# Patient Record
Sex: Male | Born: 1965 | Hispanic: Yes | Marital: Married | Smoking: Never smoker
Health system: Southern US, Community
[De-identification: ages and names within clinical notes are randomized; demographics above are authoritative.]

## PROBLEM LIST (undated history)

## (undated) DIAGNOSIS — K259 Gastric ulcer, unspecified as acute or chronic, without hemorrhage or perforation: Secondary | ICD-10-CM

## (undated) DIAGNOSIS — E785 Hyperlipidemia, unspecified: Secondary | ICD-10-CM

## (undated) DIAGNOSIS — C801 Malignant (primary) neoplasm, unspecified: Secondary | ICD-10-CM

## (undated) DIAGNOSIS — G709 Myoneural disorder, unspecified: Secondary | ICD-10-CM

## (undated) DIAGNOSIS — E119 Type 2 diabetes mellitus without complications: Secondary | ICD-10-CM

## (undated) HISTORY — DX: Hyperlipidemia, unspecified: E78.5

## (undated) HISTORY — DX: Myoneural disorder, unspecified: G70.9

## (undated) HISTORY — DX: Gastric ulcer, unspecified as acute or chronic, without hemorrhage or perforation: K25.9

## (undated) HISTORY — DX: Type 2 diabetes mellitus without complications: E11.9

---

## 2009-02-13 ENCOUNTER — Observation Stay: Payer: Self-pay | Admitting: Internal Medicine

## 2011-08-09 ENCOUNTER — Ambulatory Visit: Payer: Self-pay | Admitting: Family Medicine

## 2015-05-13 DIAGNOSIS — L97411 Non-pressure chronic ulcer of right heel and midfoot limited to breakdown of skin: Secondary | ICD-10-CM | POA: Insufficient documentation

## 2015-05-13 DIAGNOSIS — L97519 Non-pressure chronic ulcer of other part of right foot with unspecified severity: Secondary | ICD-10-CM

## 2015-05-13 DIAGNOSIS — E11621 Type 2 diabetes mellitus with foot ulcer: Secondary | ICD-10-CM | POA: Insufficient documentation

## 2018-08-27 ENCOUNTER — Encounter (INDEPENDENT_AMBULATORY_CARE_PROVIDER_SITE_OTHER): Payer: Self-pay

## 2018-08-27 ENCOUNTER — Ambulatory Visit: Payer: Self-pay | Admitting: Pharmacy Technician

## 2018-08-27 DIAGNOSIS — Z79899 Other long term (current) drug therapy: Secondary | ICD-10-CM

## 2018-08-27 NOTE — Progress Notes (Signed)
Patient speaks Spanish.  Interpretation provided by [facility] Interpreter.  Completed [facility] application and contract.  Patient agreed to all terms of the [facility] contract.    Patient approved to receive medication assistance at [facility] as long as eligibility criteria continues to be met.    Provided patient with Civil engineer, contracting based on his particular needs.    Referred patient for MTM.  Referred patient to [facility].  Patient stated that he is taking Tramadol and Lyrica.  Currently prescribed by Dr. Urban Gibson [facility].  Patient wishes to remain with Dr. Ladoris Gene.  [place] [facility]

## 2018-09-19 ENCOUNTER — Telehealth: Payer: Self-pay | Admitting: Pharmacy Technician

## 2018-09-19 NOTE — Telephone Encounter (Signed)
Received notification that a PAP Application submitted to [place] with missing information and financial documentation.  Had provided patient with Lyrica Application to complete with Dr. Ladoris Gene at [facility].  Contacted Biagio Borg at [employer].  Almyra Free is to contact patient and start process for completing Lyrica PAP Application.  [place] [facility]

## 2018-10-02 ENCOUNTER — Encounter (INDEPENDENT_AMBULATORY_CARE_PROVIDER_SITE_OTHER): Payer: Self-pay

## 2018-10-02 ENCOUNTER — Encounter: Payer: Self-pay | Admitting: Pharmacist

## 2018-10-02 ENCOUNTER — Other Ambulatory Visit: Payer: Self-pay

## 2018-10-02 ENCOUNTER — Ambulatory Visit: Payer: Self-pay | Admitting: Pharmacist

## 2018-10-02 VITALS — BP 125/71 | Ht 64.0 in | Wt 170.0 lb

## 2018-10-02 DIAGNOSIS — E119 Type 2 diabetes mellitus without complications: Secondary | ICD-10-CM | POA: Insufficient documentation

## 2018-10-02 DIAGNOSIS — Z79899 Other long term (current) drug therapy: Secondary | ICD-10-CM

## 2018-10-02 NOTE — Progress Notes (Signed)
[facility] Visit Note  Patient: Samuel Pennington MRN: [id number] Date of Birth: 08/10/1966 PCP: Ladoris Gene AT [place] 52 y.o. male presents for an initial MTM visit today. Primary objective of today's visit is to reconcile medications and assess compliance/adherance. Spanish interpretation provided by [facility] [place] Interpreter.  BP 125/71 (BP Location: Right Arm, Patient Position: Sitting, Cuff Size: Normal)   Ht 5\' 4"  (1.626 m)   Wt 170 lb (77.1 kg)   BMI 29.18 kg/m   Patient Information   Past Medical History:  Diagnosis Date  . Diabetes mellitus without complication ([place])   . Hyperlipidemia   . Neuromuscular disorder ([place])      History reviewed. No pertinent surgical history.   Family History  Problem Relation Age of Onset  . Hypertension Mother     New Diagnoses (since last visit): N/A  Family Support: GOOD  Lifestyle Diet: Breakfast:cheese, toast, coffee Lunch:3 tortillas, diet soda Dinner: salad, chicken, protein Snack; watermelon, orange, Drinks: water     Social History   Substance and Sexual Activity  Alcohol Use Yes   Comment: occassionallh      Social History   Tobacco Use  Smoking Status Never Smoker  Smokeless Tobacco Never Used       Outpatient Encounter Medications as of 10/02/2018  Medication Sig  . atorvastatin (LIPITOR) 40 MG tablet Take 40 mg by mouth at bedtime.  . cyclobenzaprine (FLEXERIL) 5 MG tablet Take 5 mg by mouth 3 (three) times daily as needed. for muscle spams  . glipiZIDE (GLUCOTROL XL) 5 MG 24 hr tablet Take 5 mg by mouth daily.  . metFORMIN (GLUCOPHAGE) 1000 MG tablet Take 1,000 mg by mouth daily.  . naproxen (NAPROSYN) 500 MG tablet Take 500 mg by mouth 2 (two) times daily with a meal.  . pregabalin (LYRICA) 75 MG capsule Take 75 mg by mouth 2 (two) times daily.  . [DISCONTINUED] naproxen (NAPROSYN) 500 MG tablet Take 500 mg by mouth 2 (two)  times daily with a meal.  . traMADol (ULTRAM) 50 MG tablet Take 50 mg by mouth every 6 (six) hours as needed.   No facility-administered encounter medications on file as of 10/02/2018.     Assessment and Plan:  Compliance/Adherance: he knows the names, frequencies, and indications for each of his medications without prompting. He states that he rarely misses doses, less than once a month. When he does miss he usually takes the dose several hours later. His last fills for medications obtained here at [facility] wer on 08/27/18 and pt still had 2 tabs left of each med. He stated that he still had some on a previous fill before they were filled here. He would like to be able to pick up his naproxen and cyclobenzaprine here. We will have these transferred today. I explained we will fill the cyclobenzaprine today and profile the naproxen.  DM: pt states he checks his sugar 2-3 times a week since the strips are so expensive. He usually only checks it when he does not feel good. We talked about recognizing lows and he stated that he does have lows occassionally maybe once a month. He recognizes what is happening and eats a piece of candy or chocolate. We also discussed that using orange juice was the best option and that regular soda could be used as well in a pinch. He is on Metformin 1000mg  daily, Glipzide ER 5mg  daily. He is also on Atorvastatin. Will send request  to MD to also add ASA 81mg  daily.  Back pain/nerve pain: well controlled on Lyrica. He was previously on TID and has been decreased to BID. He states this sufficiently controls his pain. He states he only takes the cyclobenzaprine at night since it makes him very sleepy. He told me that the naproxen causes stomach issues so he only takes it if he needs it and only if he eats a full meal. He is currently out of Tramadol but indicated that he would ask the MD for refills. Pt is aware that we are unable to provide Tramadol and Lyrica at [facility].  I would like  pt to RTC in 6 mos to assess and f/u on compliance.  Netta Neat, PharmD, [facility]) [phone]

## 2019-02-16 ENCOUNTER — Telehealth: Payer: Self-pay | Admitting: Pharmacy Technician

## 2019-02-16 NOTE — Telephone Encounter (Signed)
Received 2020 proof of income.  Patient eligible to receive medication assistance at [facility] as long as eligibility requirements continue to be met.  Still need tax return from [employer].  [place] [facility]

## 2019-04-09 ENCOUNTER — Ambulatory Visit: Payer: Self-pay | Admitting: Pharmacist

## 2019-04-09 ENCOUNTER — Encounter (INDEPENDENT_AMBULATORY_CARE_PROVIDER_SITE_OTHER): Payer: Self-pay

## 2019-04-09 ENCOUNTER — Encounter: Payer: Self-pay | Admitting: Pharmacist

## 2019-04-09 ENCOUNTER — Other Ambulatory Visit: Payer: Self-pay

## 2019-04-09 DIAGNOSIS — Z79899 Other long term (current) drug therapy: Secondary | ICD-10-CM

## 2019-04-09 NOTE — Progress Notes (Signed)
  [facility] Visit Note  Patient: Seymore Brodowski MRN: [id number] Date of Birth: June 25, 1966 PCP: No primary care provider on file.   [name] 53 y.o. male - picked up his medication today and a quick 15 minute assessment of compliance/adhereance was made.   Pt states he rarely misses doses maybe only about 1-2 times/month. He usually remembers several hours later.  DM: pt is still checking sugars 2-3 times/week. He states they run about 90-120 in the morning and about 130-140 if he checks in the afternoon. I do not have access to his labs/A1C done at [facility]. He is on Metformin 1000mg  and Glipizide ER 5mg  both QAM. He states he is not currently having  Any lows but he does know how to handle if they occur. He is on a high-intensity statin, but should also be on Aspirin 81mg  daily. Will send recommendation to PCP  to add Aspirin 81mg  for primary prevention.  Back pain: controlled with pregabalin and cyclobenzaprine. Sent refill request to PCP for Cyclobenzaprine.   RTC f/u 6 mos.  Netta Neat, PharmD, [facility]) [phone]

## 2020-01-19 ENCOUNTER — Other Ambulatory Visit: Payer: Self-pay

## 2020-01-19 ENCOUNTER — Ambulatory Visit: Payer: Self-pay | Admitting: Pharmacy Technician

## 2020-01-19 DIAGNOSIS — Z79899 Other long term (current) drug therapy: Secondary | ICD-10-CM

## 2020-01-19 NOTE — Progress Notes (Signed)
Patient speaks Spanish.  Interpretation was provided by [employer].  Samuel Pennington, ID# [id number].   Completed [facility] application, DOH Attestation, [phone] and Patient Advocate Letter. Patient is to provided notarized letter of support from son.  Patient verbally acknowledged that he understood that [facility] will be unable to provide medication assistance going forward without proof of income.    Patient stated that he is having difficulty paying rent and utilities due to loss of employment of spouse.  Sending referral to Warrick Parisian about possible assistance with paying rent and utilities.  [name] [facility]

## 2020-02-26 ENCOUNTER — Telehealth: Payer: Self-pay | Admitting: Pharmacy Technician

## 2020-02-26 NOTE — Telephone Encounter (Signed)
Received updated proof of income.  Patient eligible to receive medication assistance at [facility] until time for re-certification in 9359, and as long as eligibility requirements continue to be met.  [place] [facility]

## 2020-07-06 ENCOUNTER — Other Ambulatory Visit: Payer: Self-pay | Admitting: Family Medicine

## 2020-09-09 ENCOUNTER — Other Ambulatory Visit: Payer: Self-pay | Admitting: Family Medicine

## 2020-12-10 DIAGNOSIS — C801 Malignant (primary) neoplasm, unspecified: Secondary | ICD-10-CM

## 2020-12-10 HISTORY — DX: Malignant (primary) neoplasm, unspecified: C80.1

## 2020-12-13 ENCOUNTER — Other Ambulatory Visit: Payer: Self-pay | Admitting: Family Medicine

## 2021-02-28 ENCOUNTER — Telehealth: Payer: Self-pay | Admitting: Pharmacy Technician

## 2021-02-28 NOTE — Telephone Encounter (Signed)
Patient failed to provide 2022 proof of income.  No additional medication assistance will be provided by [facility] without the required proof of income documentation.  Patient notified by letter.  [place], [place]  [id number]  February 27, 2021   Samuel Pennington [address] [place], [place]  [address]  Greggory Stallion,  Esta carta es para informarle que usted ya no es elegible para recibir asistencia con medicamentos en [facility].  La razn es:    _____[employer] es mayor que nuestro lmite de elegibilidad. _____Los bienes tangibles (ahorros, cuenta de cheques, acciones/bonos, pensin, jubilacin, etc.) exceden             nuestro lmite.  _____Usted es elegible para recibir beneficios de la Administracin de Nurse, children's, de Medicaid y de            incapacidad. _____Usted es elegible para recibir beneficios del plan de Medicare Parte "D".  _____Usted tiene seguro para medicinas con receta. _____Usted no es residente del condado de [place].   ___X No proporcionar la declaracin de impuestos de 2022.   Si tiene preguntas, me puede contactar llamando al [phone].    Samuel Pennington,  [facility]

## 2021-06-29 ENCOUNTER — Other Ambulatory Visit: Payer: Self-pay

## 2021-10-19 ENCOUNTER — Other Ambulatory Visit: Payer: Self-pay

## 2021-11-11 ENCOUNTER — Inpatient Hospital Stay
Admission: EM | Admit: 2021-11-11 | Discharge: 2021-11-15 | DRG: 374 | Disposition: A | Discharge status: Home / Self Care | Payer: Self-pay | Attending: Obstetrics and Gynecology | Admitting: Obstetrics and Gynecology

## 2021-11-11 ENCOUNTER — Emergency Department: Payer: Self-pay

## 2021-11-11 ENCOUNTER — Other Ambulatory Visit: Payer: Self-pay

## 2021-11-11 DIAGNOSIS — Z7984 Long term (current) use of oral hypoglycemic drugs: Secondary | ICD-10-CM

## 2021-11-11 DIAGNOSIS — R079 Chest pain, unspecified: Secondary | ICD-10-CM | POA: Diagnosis present

## 2021-11-11 DIAGNOSIS — H538 Other visual disturbances: Secondary | ICD-10-CM | POA: Diagnosis present

## 2021-11-11 DIAGNOSIS — K640 First degree hemorrhoids: Secondary | ICD-10-CM | POA: Diagnosis present

## 2021-11-11 DIAGNOSIS — D509 Iron deficiency anemia, unspecified: Secondary | ICD-10-CM | POA: Diagnosis present

## 2021-11-11 DIAGNOSIS — E1140 Type 2 diabetes mellitus with diabetic neuropathy, unspecified: Secondary | ICD-10-CM | POA: Diagnosis present

## 2021-11-11 DIAGNOSIS — K2289 Other specified disease of esophagus: Secondary | ICD-10-CM

## 2021-11-11 DIAGNOSIS — D630 Anemia in neoplastic disease: Secondary | ICD-10-CM | POA: Diagnosis present

## 2021-11-11 DIAGNOSIS — B0089 Other herpesviral infection: Secondary | ICD-10-CM | POA: Diagnosis present

## 2021-11-11 DIAGNOSIS — K2091 Esophagitis, unspecified with bleeding: Secondary | ICD-10-CM | POA: Diagnosis present

## 2021-11-11 DIAGNOSIS — E119 Type 2 diabetes mellitus without complications: Secondary | ICD-10-CM

## 2021-11-11 DIAGNOSIS — K922 Gastrointestinal hemorrhage, unspecified: Secondary | ICD-10-CM

## 2021-11-11 DIAGNOSIS — D75839 Thrombocytosis, unspecified: Secondary | ICD-10-CM | POA: Diagnosis present

## 2021-11-11 DIAGNOSIS — Z79899 Other long term (current) drug therapy: Secondary | ICD-10-CM

## 2021-11-11 DIAGNOSIS — Z20822 Contact with and (suspected) exposure to covid-19: Secondary | ICD-10-CM | POA: Diagnosis present

## 2021-11-11 DIAGNOSIS — K635 Polyp of colon: Secondary | ICD-10-CM | POA: Diagnosis present

## 2021-11-11 DIAGNOSIS — Z8249 Family history of ischemic heart disease and other diseases of the circulatory system: Secondary | ICD-10-CM

## 2021-11-11 DIAGNOSIS — G894 Chronic pain syndrome: Secondary | ICD-10-CM | POA: Diagnosis present

## 2021-11-11 DIAGNOSIS — E785 Hyperlipidemia, unspecified: Secondary | ICD-10-CM | POA: Diagnosis present

## 2021-11-11 DIAGNOSIS — C159 Malignant neoplasm of esophagus, unspecified: Principal | ICD-10-CM | POA: Diagnosis present

## 2021-11-11 DIAGNOSIS — D62 Acute posthemorrhagic anemia: Secondary | ICD-10-CM | POA: Diagnosis present

## 2021-11-11 DIAGNOSIS — D649 Anemia, unspecified: Secondary | ICD-10-CM

## 2021-11-11 LAB — CBC
HCT: 14.9 % — CL (ref 39.0–52.0)
HCT: 21.5 % — ABNORMAL LOW (ref 39.0–52.0)
Hemoglobin: 4.7 g/dL — CL (ref 13.0–17.0)
Hemoglobin: 7.3 g/dL — ABNORMAL LOW (ref 13.0–17.0)
MCH: 28.7 pg (ref 26.0–34.0)
MCH: 29.3 pg (ref 26.0–34.0)
MCHC: 31.5 g/dL (ref 30.0–36.0)
MCHC: 34 g/dL (ref 30.0–36.0)
MCV: 90.9 fL (ref 80.0–100.0)
MCV: 86.3 fL (ref 80.0–100.0)
Platelets: 392 10*3/uL (ref 150–400)
Platelets: 293 10*3/uL (ref 150–400)
RBC: 1.64 MIL/uL — ABNORMAL LOW (ref 4.22–5.81)
RBC: 2.49 MIL/uL — ABNORMAL LOW (ref 4.22–5.81)
RDW: 14.3 % (ref 11.5–15.5)
RDW: 14.6 % (ref 11.5–15.5)
WBC: 7.6 10*3/uL (ref 4.0–10.5)
WBC: 6.4 10*3/uL (ref 4.0–10.5)
nRBC: 0 % (ref 0.0–0.2)
nRBC: 0.3 % — ABNORMAL HIGH (ref 0.0–0.2)

## 2021-11-11 LAB — PROTIME-INR
INR: 1.1 (ref 0.8–1.2)
Prothrombin Time: 14.2 seconds (ref 11.4–15.2)

## 2021-11-11 LAB — CBG MONITORING, ED
Glucose-Capillary: 321 mg/dL — ABNORMAL HIGH (ref 70–99)
Glucose-Capillary: 160 mg/dL — ABNORMAL HIGH (ref 70–99)

## 2021-11-11 LAB — BASIC METABOLIC PANEL
Anion gap: 7 (ref 5–15)
BUN: 12 mg/dL (ref 6–20)
CO2: 25 mmol/L (ref 22–32)
Calcium: 8.2 mg/dL — ABNORMAL LOW (ref 8.9–10.3)
Chloride: 99 mmol/L (ref 98–111)
Creatinine, Ser: 0.72 mg/dL (ref 0.61–1.24)
GFR, Estimated: >60 mL/min (ref >60)
Glucose, Bld: 335 mg/dL — ABNORMAL HIGH (ref 70–99)
Potassium: 3.8 mmol/L (ref 3.5–5.1)
Sodium: 131 mmol/L — ABNORMAL LOW (ref 135–145)

## 2021-11-11 LAB — HEPATIC FUNCTION PANEL
ALT: 19 U/L (ref 0–44)
AST: 19 U/L (ref 15–41)
Albumin: 3 g/dL — ABNORMAL LOW (ref 3.5–5.0)
Alkaline Phosphatase: 54 U/L (ref 38–126)
Bilirubin, Direct: <0.1 mg/dL (ref 0.0–0.2)
Total Bilirubin: 0.5 mg/dL (ref 0.3–1.2)
Total Protein: 6.4 g/dL — ABNORMAL LOW (ref 6.5–8.1)

## 2021-11-11 LAB — TROPONIN I (HIGH SENSITIVITY)
Troponin I (High Sensitivity): 5 ng/L (ref <18)
Troponin I (High Sensitivity): 5 ng/L (ref <18)
Troponin I (High Sensitivity): 4 ng/L (ref <18)

## 2021-11-11 LAB — URINE DRUG SCREEN, QUALITATIVE (ARMC ONLY)
Amphetamines, Ur Screen: NOT DETECTED
Barbiturates, Ur Screen: NOT DETECTED
Benzodiazepine, Ur Scrn: NOT DETECTED
Cannabinoid 50 Ng, Ur ~~LOC~~: NOT DETECTED
Cocaine Metabolite,Ur ~~LOC~~: NOT DETECTED
MDMA (Ecstasy)Ur Screen: NOT DETECTED
Methadone Scn, Ur: NOT DETECTED
Opiate, Ur Screen: NOT DETECTED
Phencyclidine (PCP) Ur S: NOT DETECTED
Tricyclic, Ur Screen: POSITIVE — AB

## 2021-11-11 LAB — GLUCOSE, CAPILLARY
Glucose-Capillary: 288 mg/dL — ABNORMAL HIGH (ref 70–99)
Glucose-Capillary: 112 mg/dL — ABNORMAL HIGH (ref 70–99)

## 2021-11-11 LAB — RESP PANEL BY RT-PCR (FLU A&B, COVID) ARPGX2
Influenza A by PCR: NEGATIVE
Influenza B by PCR: NEGATIVE
SARS Coronavirus 2 by RT PCR: NEGATIVE

## 2021-11-11 LAB — D-DIMER, QUANTITATIVE: D-Dimer, Quant: 0.39 ug/mL-FEU (ref 0.00–0.50)

## 2021-11-11 LAB — PREPARE RBC (CROSSMATCH)

## 2021-11-11 LAB — ABO/RH: ABO/RH(D): A POS

## 2021-11-11 MED ORDER — MORPHINE SULFATE (PF) 2 MG/ML IV SOLN: 2.0000 mg | INTRAVENOUS | Status: DC | PRN

## 2021-11-11 MED ORDER — PANTOPRAZOLE 80MG IVPB - SIMPLE MED
80.0000 mg | Freq: Once | INTRAVENOUS | Status: AC
  Administered 2021-11-11: 80 mg via INTRAVENOUS
  Filled 2021-11-11: qty 80

## 2021-11-11 MED ORDER — ONDANSETRON HCL 4 MG/2ML IJ SOLN: 4.0000 mg | Freq: Three times a day (TID) | INTRAMUSCULAR | Status: DC | PRN

## 2021-11-11 MED ORDER — PANTOPRAZOLE INFUSION (NEW) - SIMPLE MED
8.0000 mg/h | INTRAVENOUS | Status: AC
  Administered 2021-11-11 – 2021-11-13 (×6): 8 mg/h via INTRAVENOUS
  Filled 2021-11-11 (×2): qty 80
  Filled 2021-11-11: qty 100
  Filled 2021-11-11: qty 80
  Filled 2021-11-11: qty 100
  Filled 2021-11-11: qty 80

## 2021-11-11 MED ORDER — ACETAMINOPHEN 325 MG PO TABS: 650.0000 mg | ORAL_TABLET | Freq: Four times a day (QID) | ORAL | Status: DC | PRN

## 2021-11-11 MED ORDER — SODIUM CHLORIDE 0.9% IV SOLUTION: Freq: Once | INTRAVENOUS | Status: AC

## 2021-11-11 MED ORDER — NITROGLYCERIN 0.4 MG SL SUBL: 0.4000 mg | SUBLINGUAL_TABLET | SUBLINGUAL | Status: DC | PRN

## 2021-11-11 MED ORDER — DIAZEPAM 2 MG PO TABS
2.0000 mg | ORAL_TABLET | Freq: Three times a day (TID) | ORAL | Status: DC | PRN
  Administered 2021-11-11: 2 mg via ORAL
  Filled 2021-11-11: qty 1

## 2021-11-11 MED ORDER — OXYCODONE HCL 5 MG PO TABS
5.0000 mg | ORAL_TABLET | Freq: Four times a day (QID) | ORAL | Status: DC | PRN
  Administered 2021-11-11 – 2021-11-15 (×8): 5 mg via ORAL
  Filled 2021-11-11 (×8): qty 1

## 2021-11-11 MED ORDER — SODIUM CHLORIDE 0.9 % IV SOLN
10.0000 mL/h | Freq: Once | INTRAVENOUS | Status: AC
  Administered 2021-11-11: 10 mL/h via INTRAVENOUS

## 2021-11-11 MED ORDER — SODIUM CHLORIDE 0.9 % IV BOLUS
500.0000 mL | Freq: Once | INTRAVENOUS | Status: AC
  Administered 2021-11-11: 500 mL via INTRAVENOUS

## 2021-11-11 MED ORDER — ATORVASTATIN CALCIUM 20 MG PO TABS
40.0000 mg | ORAL_TABLET | Freq: Every day | ORAL | Status: DC
  Administered 2021-11-11 – 2021-11-15 (×3): 40 mg via ORAL
  Filled 2021-11-11 (×3): qty 2

## 2021-11-11 MED ORDER — INSULIN ASPART 100 UNIT/ML IJ SOLN
0.0000 [IU] | Freq: Every day | INTRAMUSCULAR | Status: DC
  Administered 2021-11-12 – 2021-11-14 (×2): 2 [IU] via SUBCUTANEOUS
  Filled 2021-11-11 (×2): qty 1

## 2021-11-11 MED ORDER — SODIUM CHLORIDE 0.9 % IV SOLN: INTRAVENOUS | Status: DC

## 2021-11-11 MED ORDER — INSULIN ASPART 100 UNIT/ML IJ SOLN
0.0000 [IU] | Freq: Three times a day (TID) | INTRAMUSCULAR | Status: DC
  Administered 2021-11-11: 5 [IU] via SUBCUTANEOUS
  Administered 2021-11-12: 12:00:00 3 [IU] via SUBCUTANEOUS
  Administered 2021-11-12: 09:00:00 2 [IU] via SUBCUTANEOUS
  Administered 2021-11-13: 3 [IU] via SUBCUTANEOUS
  Administered 2021-11-14: 2 [IU] via SUBCUTANEOUS
  Administered 2021-11-15: 3 [IU] via SUBCUTANEOUS
  Filled 2021-11-11 (×6): qty 1

## 2021-11-11 NOTE — ED Notes (Signed)
GI at bedside

## 2021-11-11 NOTE — H&P (Signed)
History and Physical    Samuel Pennington JJH:[id number] DOB: 1966/06/26 DOA: 11/11/2021  Referring MD/NP/PA:   PCP: Theotis Burrow, MD   Patient coming from:  The patient is coming from home.  At baseline, pt is independent for most of ADL.        Chief Complaint: dark stool, chest pain and fatigue  HPI: Samuel Pennington is a 55 y.o. male with medical history significant of hyperlipidemia, diabetes mellitus, diabetic foot ulcer, neuromuscular disorder, who presents with dark stool, chest pain and fatigue.  Patient speaks Spanish, history is obtained with help of iPad interpreter.  Patient states that he has intermittent dark stool which has been going on for almost 2 weeks.  Patient does not have abdominal pain or diarrhea.  He has mild nausea sometimes.  He states that he occasionally drinks alcohol, but denies heavy drinking.  He is not taking NSAIDs. He has fatigue and generalized weakness recently. He also reports exertional shortness of breath and intermittent chest pain.  His chest pain is located in left side of the chest, 7 out of 10 in severity sometimes, currently mild 1-2 out of 10 in severity, sometimes radiating to the back per pt. Patient cannot clearly tell if his chest pain is pleuritic or exertional. No cough, fever or chills.  He feels having a lump in left side of chest when chest pain happens. He states that he had 1 episode of dark/blurry vision when he stood up last night at home. No fall or LOC. No facial droop or slurred speech.  No unilateral numbness or tinglings in extremities. No symptoms of UTI.  ED Course: pt was found to have hemoglobin 4.7 (no baseline hemoglobin available), WBC 7.4, negative COVID PCR, INR 1.1, troponin level 5 --> 5, renal function okay, temperature 99.1, blood pressure 102/60, heart rate 113, 85, RR 23, 17, oxygen saturation 99% on room air.  Chest x-ray negative.  Patient is placed on progressive bed for observation.  Dr. Haig Prophet  of GI is consulted  Review of Systems:   General: no fevers, chills, no body weight gain, has fatigue HEENT: no blurry vision, hearing changes or sore throat Respiratory: has dyspnea, no coughing, wheezing CV: no chest pain, no palpitations GI: has nausea,  no vomiting, abdominal pain, diarrhea, constipation. Has dark stool. GU: no dysuria, burning on urination, increased urinary frequency, hematuria  Ext: no leg edema Neuro: no unilateral weakness, numbness, or tingling, no vision change or hearing loss. Has dark/blurry vision Skin: no rash, no skin tear. MSK: No muscle spasm, no deformity, no limitation of range of movement in spin Heme: No easy bruising.  Travel history: No recent long distant travel.  Allergy: No Known Allergies  Past Medical History:  Diagnosis Date   Diabetes mellitus without complication ([place])    Hyperlipidemia    Neuromuscular disorder ([place])     History reviewed. No pertinent surgical history.  Social History:  reports that he has never smoked. He has never used smokeless tobacco. He reports current alcohol use. He reports that he does not use drugs.  Family History:  Family History  Problem Relation Age of Onset   Hypertension Mother      Prior to Admission medications   Medication Sig Start Date End Date Taking? Authorizing Provider  atorvastatin (LIPITOR) 40 MG tablet TOMA UNA TABLETA POR BOCA AL ACOSTARSE PARA COLESTEROL ALTO 07/06/20 07/06/21  Revelo, Elyse Jarvis, MD  glipiZIDE (GLUCOTROL XL) 5 MG 24 hr tablet TOME 1  [place] DIABETES 12/13/20 12/13/21  Revelo, Elyse Jarvis, MD  metFORMIN (GLUCOPHAGE) 1000 MG tablet Take 1,000 mg by mouth daily. 08/27/18   Revelo, Elyse Jarvis, MD  nortriptyline (PAMELOR) 25 MG capsule Take 25 mg by mouth at bedtime. 10/23/21   [provider]  oxyCODONE (OXY IR/ROXICODONE) 5 MG immediate release tablet Take 5 mg by mouth every 6 (six) hours as needed for pain. 11/06/21    [provider]    Physical Exam: Vitals:   11/11/21 1100 11/11/21 1130 11/11/21 1200 11/11/21 1230  BP: (!) 102/54 101/61 (!) 110/55 (!) 101/54  Pulse: 89 88 93 87  Resp: 12 15 19 13   Temp:      TempSrc:      SpO2: 100% 100% 100% 100%  Weight:      Height:       General: Not in acute distress. Pale looking. HEENT:       Eyes: PERRL, EOMI, no scleral icterus.       ENT: No discharge from the ears and nose, no pharynx injection, no tonsillar enlargement.        Neck: No JVD, no bruit, no mass felt. Heme: No neck lymph node enlargement. Cardiac: S1/S2, RRR, No murmurs, No gallops or rubs. Respiratory: No rales, wheezing, rhonchi or rubs. GI: Soft, nondistended, nontender, no rebound pain, no organomegaly, BS present. GU: No hematuria Ext: No pitting leg edema bilaterally. 1+DP/PT pulse bilaterally. Musculoskeletal: No joint deformities, No joint redness or warmth, no limitation of ROM in spin. Skin: No rashes.  Neuro: Alert, oriented X3, cranial nerves II-XII grossly intact, moves all extremities normally.  Psych: Patient is not psychotic, no suicidal or hemocidal ideation.  Labs on Admission: I have personally reviewed following labs and imaging studies  CBC: Recent Labs  Lab 11/11/21 0753  WBC 7.6  HGB 4.7*  HCT 14.9*  MCV 90.9  PLT 947   Basic Metabolic Panel: Recent Labs  Lab 11/11/21 0753  NA 131*  K 3.8  CL 99  CO2 25  GLUCOSE 335*  BUN 12  CREATININE 0.72  CALCIUM 8.2*   GFR: Estimated Creatinine Clearance: 91.9 mL/min (by C-G formula based on SCr of 0.72 mg/dL). Liver Function Tests: Recent Labs  Lab 11/11/21 0900  AST 19  ALT 19  ALKPHOS 54  BILITOT 0.5  PROT 6.4*  ALBUMIN 3.0*   No results for input(s): LIPASE, AMYLASE in the last 168 hours. No results for input(s): AMMONIA in the last 168 hours. Coagulation Profile: Recent Labs  Lab 11/11/21 0900  INR 1.1   Cardiac Enzymes: No results for input(s): CKTOTAL, CKMB,  CKMBINDEX, TROPONINI in the last 168 hours. BNP (last 3 results) No results for input(s): PROBNP in the last 8760 hours. HbA1C: No results for input(s): HGBA1C in the last 72 hours. CBG: Recent Labs  Lab 11/11/21 0844 11/11/21 1138  GLUCAP 321* 160*   Lipid Profile: No results for input(s): CHOL, HDL, LDLCALC, TRIG, CHOLHDL, LDLDIRECT in the last 72 hours. Thyroid Function Tests: No results for input(s): TSH, T4TOTAL, FREET4, T3FREE, THYROIDAB in the last 72 hours. Anemia Panel: No results for input(s): VITAMINB12, FOLATE, FERRITIN, TIBC, IRON, RETICCTPCT in the last 72 hours. Urine analysis: No results found for: COLORURINE, APPEARANCEUR, LABSPEC, PHURINE, GLUCOSEU, HGBUR, BILIRUBINUR, KETONESUR, PROTEINUR, UROBILINOGEN, NITRITE, LEUKOCYTESUR Sepsis Labs: @LABRCNTIP (procalcitonin:4,lacticidven:4) ) Recent Results (from the past 240 hour(s))  Resp Panel by RT-PCR (Flu A&B, Covid) Nasopharyngeal Swab     Status: None   Collection Time:  11/11/21  9:00 AM   Specimen: Nasopharyngeal Swab; Nasopharyngeal(NP) swabs in vial transport medium  Result Value Ref Range Status   SARS Coronavirus 2 by RT PCR NEGATIVE NEGATIVE Final    Comment: (NOTE) SARS-CoV-2 target nucleic acids are NOT DETECTED.  The SARS-CoV-2 RNA is generally detectable in upper respiratory specimens during the acute phase of infection. The lowest concentration of SARS-CoV-2 viral copies this assay can detect is 138 copies/mL. A negative result does not preclude SARS-Cov-2 infection and should not be used as the sole basis for treatment or other patient management decisions. A negative result may occur with  improper specimen collection/handling, submission of specimen other than nasopharyngeal swab, presence of viral mutation(s) within the areas targeted by this assay, and inadequate number of viral copies(<138 copies/mL). A negative result must be combined with clinical observations, patient history, and  epidemiological information. The expected result is Negative.  Fact Sheet for Patients:  EntrepreneurPulse.com.au  Fact Sheet for Healthcare Providers:  IncredibleEmployment.be  This test is no t yet approved or cleared by the [place] FDA and  has been authorized for detection and/or diagnosis of SARS-CoV-2 by FDA under an Emergency Use Authorization (EUA). This EUA will remain  in effect (meaning this test can be used) for the duration of the COVID-19 declaration under Section 564(b)(1) of the Act, 21 U.S.C.section 360bbb-3(b)(1), unless the authorization is terminated  or revoked sooner.       Influenza A by PCR NEGATIVE NEGATIVE Final   Influenza B by PCR NEGATIVE NEGATIVE Final    Comment: (NOTE) The Xpert Xpress SARS-CoV-2/FLU/RSV plus assay is intended as an aid in the diagnosis of influenza from Nasopharyngeal swab specimens and should not be used as a sole basis for treatment. Nasal washings and aspirates are unacceptable for Xpert Xpress SARS-CoV-2/FLU/RSV testing.  Fact Sheet for Patients: EntrepreneurPulse.com.au  Fact Sheet for Healthcare Providers: IncredibleEmployment.be  This test is not yet approved or cleared by the [place] FDA and has been authorized for detection and/or diagnosis of SARS-CoV-2 by FDA under an Emergency Use Authorization (EUA). This EUA will remain in effect (meaning this test can be used) for the duration of the COVID-19 declaration under Section 564(b)(1) of the Act, 21 U.S.C. section 360bbb-3(b)(1), unless the authorization is terminated or revoked.  Performed at [facility], [address], [place], [place] [address]      Radiological Exams on Admission: DG Chest 2 View  Result Date: 11/11/2021 CLINICAL DATA:  Chest pain.  Sternal pain. EXAM: CHEST - 2 VIEW COMPARISON:  None. FINDINGS: Heart size and mediastinal contours are within  normal limits. Lungs are clear. No pleural effusion or pneumothorax is seen. Osseous structures about the chest are unremarkable. IMPRESSION: Negative chest x-ray.  No active cardiopulmonary disease. Electronically Signed   By: Franki Cabot M.D.   On: 11/11/2021 08:24     EKG: I have personally reviewed.  Sinus rhythm, QTC 449, borderline right axis deviation, early R wave progression  Assessment/Plan Principal Problem:   GI bleeding Active Problems:   Diabetes mellitus without complication (HCC)   Hyperlipidemia   Chest pain   Acute blood loss anemia   Blurry vision   GI bleeding and acute blood loss anemia: Hgb 4.7, no baseline hemoglobin on record.  Patient is symptomatic with chest pain and shortness breath.  - Placed on progressive unit for observation - transfuse 3 units of blood now - IVF: 500 mL NS bolus, then at 75 mL/hr - Start IV pantoprazole gtt -  Zofran IV for nausea - Avoid NSAIDs and SQ heparin - Maintain IV access (2 large bore IVs if possible). - Monitor closely and follow q6h cbc, transfuse as necessary, if Hgb<7.0 - LaB: INR, PTT and type screen  Diabetes mellitus without complication ([place]): No J1E on record.  Patient still taking metformin and glipizide.  Blood sugar 335 today. -SSI -check A1c  Hyperlipidemia -Lipitor  Chest pain: trop negative x 2 so far.  Likely due to demand ischemia.  But the patient cannot tell clearly if his chest pain is pleuritic or exertional. - Trend Trop - prn Nitroglycerin, Morphine, lipitor  - will not give ASA due to GIB - Risk factor stratification: will check FLP and A1C  - check UDS - f/u D-dimer. If d-dimer is positive --> will get CTA to r/o PE.  Blurry vision: Patient states that he had 1 episode of blurry/dark vision when he stood up. This seems to be orthostatic status secondary to volume depletion due to severe anemia.  No focal neurodeficit on physical examination, low suspicions for stroke. -Check orthostatic  vital sign -Blood transfusion as above -IV fluid as above     DVT ppx: SCD Code Status: Full code Family Communication:  Yes, patient's wife   at bed side.  Disposition Plan:  Anticipate discharge back to previous environment Consults called:  Dr. Haig Prophet for GI Admission status and Level of care: Progressive:     for obs   Status is: Observation  The patient remains OBS appropriate and will d/c before 2 midnights.           Date of Service 11/11/2021    Ivor Costa [facility]   If 7PM-7AM, please contact night-coverage [email or web address] 11/11/2021, 12:42 PM

## 2021-11-11 NOTE — ED Provider Notes (Signed)
[facility] Emergency Department Provider Note  Time seen: 9:51 AM  I have reviewed the triage vital signs and the nursing notes.   HISTORY  Chief Complaint Chest Pain   HPI Samuel Pennington is a 55 y.o. male with a past medical history of diabetes, hyperlipidemia, presents to the emergency department for generalized fatigue and weakness.  According to the patient over the past 3 or 4 days he has been feeling very weak fatigued short of breath with exertion.  On review of systems questioning he states he has been experiencing very dark stool for the past 3 days.  No history of GI bleed previously.  Denies any significant alcohol use aspirin or NSAID use.  Is not on blood thinners.  Patient denies any abdominal pain nausea or vomiting.  States he has been experiencing intermittent chest pain which she describes as more left-sided as a tightness sensation, denies any currently.  Noted shortness of breath currently but does state shortness of breath with exertion.  No cough or fever.   Past Medical History:  Diagnosis Date   Diabetes mellitus without complication ([place])    Hyperlipidemia    Neuromuscular disorder [facility])     Patient Active Problem List   Diagnosis Date Noted   DM2 (diabetes mellitus, type 2) ([place]) 10/02/2018   Diabetic ulcer of right foot associated with type 2 diabetes mellitus ([place]) 05/13/2015   Ulcer of right heel and midfoot, limited to breakdown of skin ([place]) 05/13/2015    No past surgical history on file.  Prior to Admission medications   Medication Sig Start Date End Date Taking? Authorizing Provider  atorvastatin (LIPITOR) 40 MG tablet TOMA UNA TABLETA POR BOCA AL ACOSTARSE PARA COLESTEROL ALTO 07/06/20 07/06/21  Revelo, Elyse Jarvis, MD  glipiZIDE (GLUCOTROL XL) 5 MG 24 hr tablet TOME 1 TABLETA POR BOCA DIARIO PARA LA DIABETES 12/13/20 12/13/21  Revelo, Elyse Jarvis, MD  metFORMIN (GLUCOPHAGE) 1000 MG tablet Take 1,000 mg by mouth  daily. 08/27/18   Revelo, Elyse Jarvis, MD  nortriptyline (PAMELOR) 25 MG capsule Take 25 mg by mouth at bedtime. 10/23/21   [provider]  oxyCODONE (OXY IR/ROXICODONE) 5 MG immediate release tablet Take 5 mg by mouth every 6 (six) hours as needed for pain. 11/06/21   [provider]    No Known Allergies  Family History  Problem Relation Age of Onset   Hypertension Mother     Social History Social History   Tobacco Use   Smoking status: Never   Smokeless tobacco: Never  Substance Use Topics   Alcohol use: Yes    Comment: occassionallh   Drug use: Never    Review of Systems Constitutional: Negative for fever.  Positive for weakness/fatigue x3 to 4 days. Cardiovascular: Negative for chest pain. Respiratory: Shortness of breath with exertion Gastrointestinal: Negative for abdominal pain, vomiting Musculoskeletal: Negative for musculoskeletal complaints Neurological: Negative for headache All other ROS negative  ____________________________________________   PHYSICAL EXAM:  VITAL SIGNS: ED Triage Vitals  Enc Vitals Group     BP 11/11/21 0753 125/66     Pulse Rate 11/11/21 0753 (!) 113     Resp 11/11/21 0753 18     Temp 11/11/21 0753 99.1 F (37.3 C)     Temp Source 11/11/21 0753 Oral     SpO2 11/11/21 0753 100 %     Weight 11/11/21 0754 160 lb (72.6 kg)     Height 11/11/21 0754 5\' 6"  (1.676 m)  Head Circumference --      Peak Flow --      Pain Score 11/11/21 0754 6     Pain Loc --      Pain Edu? --      Excl. in [place]? --    Constitutional: Alert and oriented. Well appearing and in no distress. Eyes: Normal exam ENT      Head: Normocephalic and atraumatic.      Mouth/Throat: Mucous membranes are moist. Cardiovascular: Normal rate, regular rhythm.  Respiratory: Normal respiratory effort without tachypnea nor retractions. Breath sounds are clear  Gastrointestinal: Soft and nontender. No distention.  Musculoskeletal: Nontender with  normal range of motion in all extremities. Neurologic:  Normal speech and language. No gross focal neurologic deficits  Skin:  Skin is warm, dry and intact.  Psychiatric: Mood and affect are normal.   ____________________________________________    EKG  EKG viewed and interpreted by myself shows sinus tachycardia 110 bpm with a narrow QRS, normal axis, normal intervals, nonspecific but no concerning ST changes.  ____________________________________________    RADIOLOGY  Chest x-ray is normal  ____________________________________________   INITIAL IMPRESSION / ASSESSMENT AND PLAN / ED COURSE  Pertinent labs & imaging results that were available during my care of the patient were reviewed by me and considered in my medical decision making (see chart for details).   Patient presents emergency department for 3 days of fatigue, dyspnea on exertion, weakness and intermittent chest pain.  Patient's labs have resulted showing extremely low hemoglobin of 4.7.  No history of GI bleed previously, rectal examination shows dark black stool strongly guaiac positive.  No abdominal tenderness.  Given the patient's melena we will start the patient on Protonix bolus and infusion, I have ordered 2 units of packed red blood cells and I have consented the patient with the Spanish interpreter.  Remainder the lab work is nonrevealing, troponin negative.  Patient will require admission to the hospital service for further work-up and treatment.  Patient agreeable to plan of care.  No old labs for comparison in our system or care everywhere.  Samuel Pennington was evaluated in Emergency Department on 11/11/2021 for the symptoms described in the history of present illness. He was evaluated in the context of the global COVID-19 pandemic, which necessitated consideration that the patient might be at risk for infection with the SARS-CoV-2 virus that causes COVID-19. Institutional protocols and algorithms that pertain  to the evaluation of patients at risk for COVID-19 are in a state of rapid change based on information released by regulatory bodies including the CDC and federal and state organizations. These policies and algorithms were followed during the patient's care in the ED.  CRITICAL CARE Performed by: Harvest Dark   Total critical care time: 30 minutes  Critical care time was exclusive of separately billable procedures and treating other patients.  Critical care was necessary to treat or prevent imminent or life-threatening deterioration.  Critical care was time spent personally by me on the following activities: development of treatment plan with patient and/or surrogate as well as nursing, discussions with consultants, evaluation of patient's response to treatment, examination of patient, obtaining history from patient or surrogate, ordering and performing treatments and interventions, ordering and review of laboratory studies, ordering and review of radiographic studies, pulse oximetry and re-evaluation of patient's condition.  ____________________________________________   FINAL CLINICAL IMPRESSION(S) / ED DIAGNOSES  GI bleed Symptomatic anemia   Harvest Dark, MD 11/11/21 1507

## 2021-11-11 NOTE — Consult Note (Signed)
Consultation  Referring Provider: Hospitalist     Admit date: 11/11/2021 Consult date: 11/11/2021         Reason for Consultation:  Symptomatic anemia            HPI:   Samuel Pennington is a 55 y.o. gentleman with chronic pain syndrome here with reported history of two weeks of dark stool and constipation. No tarry, loose bowel movements. Hemoglobin at presentation was low at 4.7. Denies NSAID use. Denies significant alcohol use. Denies family history of GI malignancies. Denies abdominal surgeries. No hematemesis or hematochezia. No blood thinners. No abdominal pain. He has never had a EGD/Colonoscopy before. BUN is normal. No history of IV drug use.  Past Medical History:  Diagnosis Date   Diabetes mellitus without complication ([place])    Hyperlipidemia    Neuromuscular disorder ([place])     History reviewed. No pertinent surgical history.  Family History  Problem Relation Age of Onset   Hypertension Mother      Social History   Tobacco Use   Smoking status: Never   Smokeless tobacco: Never  Substance Use Topics   Alcohol use: Yes    Comment: occassionallh   Drug use: Never    Prior to Admission medications   Medication Sig Start Date End Date Taking? Authorizing Provider  atorvastatin (LIPITOR) 40 MG tablet TOMA UNA TABLETA POR BOCA AL ACOSTARSE PARA COLESTEROL ALTO 07/06/20 11/11/21 Yes Revelo, Elyse Jarvis, MD  glipiZIDE (GLUCOTROL XL) 5 MG 24 hr tablet TOME 1 TABLETA POR BOCA DIARIO PARA LA DIABETES 12/13/20 12/13/21 Yes Revelo, Elyse Jarvis, MD  metFORMIN (GLUCOPHAGE) 1000 MG tablet Take 1,000 mg by mouth daily. 08/27/18  Yes Revelo, Elyse Jarvis, MD  oxyCODONE (OXY IR/ROXICODONE) 5 MG immediate release tablet Take 5 mg by mouth every 6 (six) hours as needed for pain. 11/06/21  Yes [provider]  nortriptyline (PAMELOR) 25 MG capsule Take 25 mg by mouth at bedtime. Patient not taking: Reported on 11/11/2021 10/23/21   [provider]    Current  Facility-Administered Medications  Medication Dose Route Frequency Provider Last Rate Last Admin   0.9 %  sodium chloride infusion   Intravenous Continuous Ivor Costa, MD   Held at 11/11/21 1102   acetaminophen (TYLENOL) tablet 650 mg  650 mg Oral Q6H PRN Ivor Costa, MD       atorvastatin (LIPITOR) tablet 40 mg  40 mg Oral Daily Ivor Costa, MD   40 mg at 11/11/21 1241   insulin aspart (novoLOG) injection 0-5 Units  0-5 Units Subcutaneous QHS Ivor Costa, MD       insulin aspart (novoLOG) injection 0-9 Units  0-9 Units Subcutaneous TID WC Ivor Costa, MD       morphine 2 MG/ML injection 2 mg  2 mg Intravenous Q4H PRN Ivor Costa, MD       nitroGLYCERIN (NITROSTAT) SL tablet 0.4 mg  0.4 mg Sublingual Q5 min PRN Ivor Costa, MD       ondansetron [facility]) injection 4 mg  4 mg Intravenous Q8H PRN Ivor Costa, MD       oxyCODONE (Oxy IR/ROXICODONE) immediate release tablet 5 mg  5 mg Oral Q6H PRN Ivor Costa, MD       pantoprozole (PROTONIX) 80 mg /NS 100 mL infusion  8 mg/hr Intravenous Continuous Ivor Costa, MD 10 mL/hr at 11/11/21 1045 8 mg/hr at 11/11/21 1045   Current Outpatient Medications  Medication Sig Dispense Refill   atorvastatin (LIPITOR) 40 MG tablet TOMA UNA  TABLETA POR BOCA AL ACOSTARSE PARA COLESTEROL ALTO 90 tablet 3   glipiZIDE (GLUCOTROL XL) 5 MG 24 hr tablet TOME 1 TABLETA POR BOCA DIARIO PARA LA DIABETES 90 tablet 1   metFORMIN (GLUCOPHAGE) 1000 MG tablet Take 1,000 mg by mouth daily.     oxyCODONE (OXY IR/ROXICODONE) 5 MG immediate release tablet Take 5 mg by mouth every 6 (six) hours as needed for pain.     nortriptyline (PAMELOR) 25 MG capsule Take 25 mg by mouth at bedtime. (Patient not taking: Reported on 11/11/2021)      Allergies as of 11/11/2021   (No Known Allergies)     Review of Systems:    All systems reviewed and negative except where noted in HPI.  Review of Systems  Constitutional:  Positive for chills, malaise/fatigue and weight loss. Negative for fever.   Respiratory:  Negative for cough.   Cardiovascular:  Negative for leg swelling.  Gastrointestinal:  Positive for constipation. Negative for abdominal pain, nausea and vomiting.  Musculoskeletal:  Positive for back pain, joint pain and neck pain.  Skin:  Negative for rash.  Neurological:  Negative for focal weakness.  Psychiatric/Behavioral:  Negative for substance abuse and suicidal ideas.   All other systems reviewed and are negative.     Physical Exam:  Vital signs in last 24 hours: Temp:  [98.3 F (36.8 C)-99.1 F (37.3 C)] 98.6 F (37 C) (12/03 1052) Pulse Rate:  [85-113] 87 (12/03 1230) Resp:  [12-23] 13 (12/03 1230) BP: (101-125)/(54-69) 101/54 (12/03 1230) SpO2:  [99 %-100 %] 100 % (12/03 1230) Weight:  [72.6 kg] 72.6 kg (12/03 0754)   General:   Pleasant in NAD Head:  Normocephalic and atraumatic. Eyes:   No icterus.   Conjunctiva pink. Mouth: Mucosa pink moist, no lesions. Neck:  Supple; no masses felt Lungs: No respiratory distress Abdomen:   Flat, soft, nondistended, nontender Msk:  MAEW x4, No clubbing or cyanosis. Strength 5/5. Symmetrical without gross deformities. Neurologic:  Alert and  oriented x4;  Cranial nerves II-XII intact.  Skin:  Warm, dry, pink without significant lesions or rashes. Psych:  Alert and cooperative. Normal affect.  LAB RESULTS: Recent Labs    11/11/21 0753  WBC 7.6  HGB 4.7*  HCT 14.9*  PLT 392   BMET Recent Labs    11/11/21 0753  NA 131*  K 3.8  CL 99  CO2 25  GLUCOSE 335*  BUN 12  CREATININE 0.72  CALCIUM 8.2*   LFT Recent Labs    11/11/21 0900  PROT 6.4*  ALBUMIN 3.0*  AST 19  ALT 19  ALKPHOS 54  BILITOT 0.5  BILIDIR <0.1  IBILI NOT CALCULATED   PT/INR Recent Labs    11/11/21 0900  LABPROT 14.2  INR 1.1    STUDIES: DG Chest 2 View  Result Date: 11/11/2021 CLINICAL DATA:  Chest pain.  Sternal pain. EXAM: CHEST - 2 VIEW COMPARISON:  None. FINDINGS: Heart size and mediastinal contours are within  normal limits. Lungs are clear. No pleural effusion or pneumothorax is seen. Osseous structures about the chest are unremarkable. IMPRESSION: Negative chest x-ray.  No active cardiopulmonary disease. Electronically Signed   By: Franki Cabot M.D.   On: 11/11/2021 08:24       Impression / Plan:   56 y/o gentleman with chronic pain and what sounds to be symptomatic anemia. Given this we will perform EGD/Colonoscopy on Monday.  - ok for full liquids today but will need clear liquids tomorrow -  NPO tomorrow at midnight - daily CBC's - IV protonix daily is fine - transfuse for hemoglobin < 7 - monitor for overt GI bleeding - pain control per primary team - further recs after procedures on Monday  Please call with any questions or concerns.  Raylene Miyamoto MD, MPH [place]

## 2021-11-11 NOTE — ED Triage Notes (Signed)
Interpreter # [phone] used Pt reports chest pain for the past 2 weeks and having trouble with hypoglycemia, pt diabetic. Was seen at [facility] drew for same recently.

## 2021-11-11 NOTE — ED Notes (Signed)
Consent for blood obtained.

## 2021-11-11 NOTE — ED Notes (Signed)
MD Charlann Boxer given critical values of HGB 4.7 and HCT 14.9.

## 2021-11-11 NOTE — ED Notes (Signed)
2nd unit of blood started. Labs to be obtained after blood transfusions are completed.

## 2021-11-12 DIAGNOSIS — D509 Iron deficiency anemia, unspecified: Secondary | ICD-10-CM

## 2021-11-12 DIAGNOSIS — K922 Gastrointestinal hemorrhage, unspecified: Secondary | ICD-10-CM | POA: Diagnosis present

## 2021-11-12 LAB — FERRITIN: Ferritin: 4 ng/mL — ABNORMAL LOW (ref 24–336)

## 2021-11-12 LAB — TYPE AND SCREEN
ABO/RH(D): A POS
Antibody Screen: NEGATIVE
Unit division: 0
Unit division: 0
Unit division: 0

## 2021-11-12 LAB — CBC
HCT: 23.3 % — ABNORMAL LOW (ref 39.0–52.0)
HCT: 23.5 % — ABNORMAL LOW (ref 39.0–52.0)
HCT: 25.8 % — ABNORMAL LOW (ref 39.0–52.0)
HCT: 24.1 % — ABNORMAL LOW (ref 39.0–52.0)
Hemoglobin: 8 g/dL — ABNORMAL LOW (ref 13.0–17.0)
Hemoglobin: 7.8 g/dL — ABNORMAL LOW (ref 13.0–17.0)
Hemoglobin: 8.5 g/dL — ABNORMAL LOW (ref 13.0–17.0)
Hemoglobin: 8 g/dL — ABNORMAL LOW (ref 13.0–17.0)
MCH: 29.3 pg (ref 26.0–34.0)
MCH: 28.5 pg (ref 26.0–34.0)
MCH: 28.5 pg (ref 26.0–34.0)
MCH: 28.5 pg (ref 26.0–34.0)
MCHC: 34.3 g/dL (ref 30.0–36.0)
MCHC: 33.2 g/dL (ref 30.0–36.0)
MCHC: 32.9 g/dL (ref 30.0–36.0)
MCHC: 33.2 g/dL (ref 30.0–36.0)
MCV: 85.3 fL (ref 80.0–100.0)
MCV: 85.8 fL (ref 80.0–100.0)
MCV: 86.6 fL (ref 80.0–100.0)
MCV: 85.8 fL (ref 80.0–100.0)
Platelets: 302 10*3/uL (ref 150–400)
Platelets: 351 10*3/uL (ref 150–400)
Platelets: 376 10*3/uL (ref 150–400)
Platelets: 343 10*3/uL (ref 150–400)
RBC: 2.73 MIL/uL — ABNORMAL LOW (ref 4.22–5.81)
RBC: 2.74 MIL/uL — ABNORMAL LOW (ref 4.22–5.81)
RBC: 2.98 MIL/uL — ABNORMAL LOW (ref 4.22–5.81)
RBC: 2.81 MIL/uL — ABNORMAL LOW (ref 4.22–5.81)
RDW: 14.6 % (ref 11.5–15.5)
RDW: 15 % (ref 11.5–15.5)
RDW: 15 % (ref 11.5–15.5)
RDW: 14.7 % (ref 11.5–15.5)
WBC: 6.5 10*3/uL (ref 4.0–10.5)
WBC: 6.2 10*3/uL (ref 4.0–10.5)
WBC: 7.1 10*3/uL (ref 4.0–10.5)
WBC: 7.3 10*3/uL (ref 4.0–10.5)
nRBC: 0 % (ref 0.0–0.2)
nRBC: 0.3 % — ABNORMAL HIGH (ref 0.0–0.2)
nRBC: 0.3 % — ABNORMAL HIGH (ref 0.0–0.2)
nRBC: 0.3 % — ABNORMAL HIGH (ref 0.0–0.2)

## 2021-11-12 LAB — BPAM RBC
Blood Product Expiration Date: 202212242359
Blood Product Expiration Date: 202212242359
Blood Product Expiration Date: 202212262359
ISSUE DATE / TIME: 202212031033
ISSUE DATE / TIME: 202212031306
ISSUE DATE / TIME: 202212031640
Unit Type and Rh: 6200
Unit Type and Rh: 6200
Unit Type and Rh: 6200

## 2021-11-12 LAB — GLUCOSE, CAPILLARY
Glucose-Capillary: 163 mg/dL — ABNORMAL HIGH (ref 70–99)
Glucose-Capillary: 209 mg/dL — ABNORMAL HIGH (ref 70–99)
Glucose-Capillary: 85 mg/dL (ref 70–99)
Glucose-Capillary: 203 mg/dL — ABNORMAL HIGH (ref 70–99)

## 2021-11-12 LAB — VITAMIN B12: Vitamin B-12: 800 pg/mL (ref 180–914)

## 2021-11-12 LAB — LIPID PANEL
Cholesterol: 117 mg/dL (ref 0–200)
HDL: 41 mg/dL (ref >40)
LDL Cholesterol: 68 mg/dL (ref 0–99)
Total CHOL/HDL Ratio: 2.9 RATIO
Triglycerides: 39 mg/dL (ref <150)
VLDL: 8 mg/dL (ref 0–40)

## 2021-11-12 LAB — RETICULOCYTES
Immature Retic Fract: 25.6 % — ABNORMAL HIGH (ref 2.3–15.9)
RBC.: 2.54 MIL/uL — ABNORMAL LOW (ref 4.22–5.81)
Retic Count, Absolute: 93.7 10*3/uL (ref 19.0–186.0)
Retic Ct Pct: 3.7 % — ABNORMAL HIGH (ref 0.4–3.1)

## 2021-11-12 LAB — PREPARE RBC (CROSSMATCH)

## 2021-11-12 LAB — HIV ANTIBODY (ROUTINE TESTING W REFLEX): HIV Screen 4th Generation wRfx: NONREACTIVE

## 2021-11-12 LAB — IRON AND TIBC
Iron: 15 ug/dL — ABNORMAL LOW (ref 45–182)
Saturation Ratios: 5 % — ABNORMAL LOW (ref 17.9–39.5)
TIBC: 332 ug/dL (ref 250–450)
UIBC: 317 ug/dL

## 2021-11-12 LAB — FOLATE: Folate: 20.2 ng/mL (ref >5.9)

## 2021-11-12 MED ORDER — PEG 3350-KCL-NA BICARB-NACL 420 G PO SOLR
4000.0000 mL | Freq: Once | ORAL | Status: AC
Start: 2021-11-12 — End: 2021-11-12
  Administered 2021-11-12: 18:00:00 4000 mL via ORAL
  Filled 2021-11-12: qty 4000

## 2021-11-12 MED ORDER — SODIUM CHLORIDE 0.9 % IV SOLN
200.0000 mg | INTRAVENOUS | Status: AC
  Administered 2021-11-12 – 2021-11-13 (×2): 200 mg via INTRAVENOUS
  Filled 2021-11-12: qty 200
  Filled 2021-11-12: qty 10
  Filled 2021-11-12: qty 200
  Filled 2021-11-12: qty 10

## 2021-11-12 NOTE — Care Plan (Signed)
EGD/Colonoscopy tomorrow. Further recs after procedures.  - 1/2 prep tonight and other half tomorrow morning with goal of being finished by 8 am - clear liquids today - make sure hemoglobin > 7 tomorrow  Raylene Miyamoto MD, MPH [employer]

## 2021-11-12 NOTE — Progress Notes (Signed)
[place] at [place] NAME: Samuel Pennington    MR#:  [id number]  DATE OF BIRTH:  07-30-1966  SUBJECTIVE:  patient tolerating clear liquids well. Denies any abdominal pain chest pain or shortness of breath. Came in with hemoglobin of 4.7. He is status post three unit blood transfusion. G.I. evaluation tomorrow. REVIEW OF SYSTEMS:   ROS Tolerating Diet: Tolerating PT:   DRUG ALLERGIES:  No Known Allergies  VITALS:  Blood pressure 109/65, pulse 79, temperature 98.4 F (36.9 C), resp. rate 18, height 5\' 6"  (1.676 m), weight 72.6 kg, SpO2 100 %.  PHYSICAL EXAMINATION:   Physical Exam  GENERAL:  55 y.o.-year-old patient lying in the bed with no acute distress.  HEENT: Head atraumatic, normocephalic. Oropharynx and nasopharynx clear. Pallor+ LUNGS: Normal breath sounds bilaterally, no wheezing, rales, rhonchi. No use of accessory muscles of respiration.  CARDIOVASCULAR: S1, S2 normal. No murmurs, rubs, or gallops.  ABDOMEN: Soft, nontender, nondistended. Bowel sounds present. No organomegaly or mass.  EXTREMITIES: No cyanosis, clubbing or edema b/l.    NEUROLOGIC: nonfocal PSYCHIATRIC:  patient is alert and oriented x 3.  SKIN: No obvious rash, lesion, or ulcer.   LABORATORY PANEL:  CBC Recent Labs  Lab 11/12/21 1105  WBC 6.2  HGB 7.8*  HCT 23.5*  PLT 351    Chemistries  Recent Labs  Lab 11/11/21 0753 11/11/21 0900  NA 131*  --   K 3.8  --   CL 99  --   CO2 25  --   GLUCOSE 335*  --   BUN 12  --   CREATININE 0.72  --   CALCIUM 8.2*  --   AST  --  19  ALT  --  19  ALKPHOS  --  54  BILITOT  --  0.5   Cardiac Enzymes No results for input(s): TROPONINI in the last 168 hours. RADIOLOGY:  DG Chest 2 View  Result Date: 11/11/2021 CLINICAL DATA:  Chest pain.  Sternal pain. EXAM: CHEST - 2 VIEW COMPARISON:  None. FINDINGS: Heart size and mediastinal contours are within normal limits. Lungs are clear. No pleural  effusion or pneumothorax is seen. Osseous structures about the chest are unremarkable. IMPRESSION: Negative chest x-ray.  No active cardiopulmonary disease. Electronically Signed   By: Samuel Pennington M.D.   On: 11/11/2021 08:24   ASSESSMENT AND PLAN:  Samuel Pennington is a 55 y.o. male with medical history significant of hyperlipidemia, diabetes mellitus, diabetic foot ulcer, history of lumbar radiculopathy, who presents with dark stool, chest pain and fatigue.  G.I. bleed-- appears chronic slow G.I. bleed iron deficiency anemia -- unclear etiology -- came in with hemoglobin of 4.7. And dark colored stools  --no baseline hemoglobin on record here and at [facility] -- patient came in with increasing shortness of breath and weakness --- hemoglobin 4.7--- three unit blood transfusion 8.0-- 7.8 -- patient denies taking any blood thinners or anticoagulation. -- Seen by G.I. Dr. Haig Pennington plans on EGD and colonoscopy tomorrow -- continue Protonix -- transfuse if hemoglobin less than 7.0 -- patient will benefit from IV iron. Will start IV venofer here. Patient will follow-up with PCP and hematology  Hyperlipidemia -- continue statins  type II diabetes with neuropathy -- continue sliding scale insulin -- patient takes glipizide and metformin at home     Procedures: Family communication :wife Consults :GI CODE STATUS: FULL DVT Prophylaxis :SCD Level of care: Progressive Status is: Observation  The patient  remains OBS appropriate and will d/c before 2 midnights. W/u for anemia      TOTAL TIME TAKING CARE OF THIS PATIENT: 30 minutes.  >50% time spent on counselling and coordination of care  Note: This dictation was prepared with Dragon dictation along with smaller phrase technology. Any transcriptional errors that result from this process are unintentional.  Samuel Pennington M.D    [facility]   CC: Primary care physician; Samuel Mires Elyse Jarvis, MD Patient ID: Samuel Pennington, male   DOB: 1966/06/27, 55 y.o.   MRN: [id number]

## 2021-11-13 ENCOUNTER — Encounter: Payer: Self-pay | Admitting: Internal Medicine

## 2021-11-13 LAB — HEMOGLOBIN A1C
Hgb A1c MFr Bld: 6.5 % — ABNORMAL HIGH (ref 4.8–5.6)
Mean Plasma Glucose: 140 mg/dL

## 2021-11-13 LAB — CBC
HCT: 24.2 % — ABNORMAL LOW (ref 39.0–52.0)
Hemoglobin: 7.9 g/dL — ABNORMAL LOW (ref 13.0–17.0)
MCH: 27.8 pg (ref 26.0–34.0)
MCHC: 32.6 g/dL (ref 30.0–36.0)
MCV: 85.2 fL (ref 80.0–100.0)
Platelets: 340 10*3/uL (ref 150–400)
RBC: 2.84 MIL/uL — ABNORMAL LOW (ref 4.22–5.81)
RDW: 14.7 % (ref 11.5–15.5)
WBC: 6.4 10*3/uL (ref 4.0–10.5)
nRBC: 0.3 % — ABNORMAL HIGH (ref 0.0–0.2)

## 2021-11-13 LAB — MRSA NEXT GEN BY PCR, NASAL: MRSA by PCR Next Gen: NOT DETECTED

## 2021-11-13 LAB — GLUCOSE, CAPILLARY
Glucose-Capillary: 123 mg/dL — ABNORMAL HIGH (ref 70–99)
Glucose-Capillary: 119 mg/dL — ABNORMAL HIGH (ref 70–99)
Glucose-Capillary: 235 mg/dL — ABNORMAL HIGH (ref 70–99)
Glucose-Capillary: 113 mg/dL — ABNORMAL HIGH (ref 70–99)

## 2021-11-13 MED ORDER — PEG 3350-KCL-NA BICARB-NACL 420 G PO SOLR
4000.0000 mL | Freq: Once | ORAL | Status: AC
  Administered 2021-11-13: 4000 mL via ORAL
  Filled 2021-11-13: qty 4000

## 2021-11-13 NOTE — Care Plan (Signed)
Patient still with solid stool so will attempt EGD/Colonoscopy tomorrow. More prep ordered for tonight.  Raylene Miyamoto MD, MPH

## 2021-11-13 NOTE — Progress Notes (Addendum)
[place] at [place] NAME: Samuel Pennington    MR#:  [id number]  DATE OF BIRTH:  [birth year]  SUBJECTIVE:  patient tolerating clear liquids well. Denies any abdominal pain chest pain or shortness of breath. Came in with hemoglobin of 4.7. He is status post three unit blood transfusion.  EGD colonoscopy canceled due to poor prep REVIEW OF SYSTEMS:   Review of Systems  Constitutional:  Negative for chills, fever and weight loss.  HENT:  Negative for ear discharge, ear pain and nosebleeds.   Eyes:  Negative for blurred vision, pain and discharge.  Respiratory:  Negative for sputum production, shortness of breath, wheezing and stridor.   Cardiovascular:  Negative for chest pain, palpitations, orthopnea and PND.  Gastrointestinal:  Negative for abdominal pain, diarrhea, nausea and vomiting.  Genitourinary:  Negative for frequency and urgency.  Musculoskeletal:  Negative for back pain and joint pain.  Neurological:  Positive for weakness. Negative for sensory change, speech change and focal weakness.  Psychiatric/Behavioral:  Negative for depression and hallucinations. The patient is not nervous/anxious.   Tolerating Diet: Tolerating PT:   DRUG ALLERGIES:  No Known Allergies  VITALS:  Blood pressure 113/63, pulse 86, temperature (!) 97.2 F (36.2 C), temperature source Temporal, resp. rate 17, height 5\' 6"  (1.676 m), weight 66.6 kg, SpO2 100 %.  PHYSICAL EXAMINATION:   Physical Exam  GENERAL:  55 y.o.-year-old patient lying in the bed with no acute distress.  HEENT: Head atraumatic, normocephalic. Oropharynx and nasopharynx clear. Pallor+ LUNGS: Normal breath sounds bilaterally, no wheezing, rales, rhonchi. No use of accessory muscles of respiration.  CARDIOVASCULAR: S1, S2 normal. No murmurs, rubs, or gallops.  ABDOMEN: Soft, nontender, nondistended. Bowel sounds present. No organomegaly or mass.  EXTREMITIES: No cyanosis, clubbing or  edema b/l.    NEUROLOGIC: nonfocal PSYCHIATRIC:  patient is alert and oriented x 3.  SKIN: No obvious rash, lesion, or ulcer.   LABORATORY PANEL:  CBC Recent Labs  Lab 11/13/21 0538  WBC 6.4  HGB 7.9*  HCT 24.2*  PLT 340     Chemistries  Recent Labs  Lab 11/11/21 0753 11/11/21 0900  NA 131*  --   K 3.8  --   CL 99  --   CO2 25  --   GLUCOSE 335*  --   BUN 12  --   CREATININE 0.72  --   CALCIUM 8.2*  --   AST  --  19  ALT  --  19  ALKPHOS  --  54  BILITOT  --  0.5    Cardiac Enzymes No results for input(s): TROPONINI in the last 168 hours. RADIOLOGY:  No results found. ASSESSMENT AND PLAN:  Samuel Pennington is a 55 y.o. male with medical history significant of hyperlipidemia, diabetes mellitus, diabetic foot ulcer, history of lumbar radiculopathy, who presents with dark stool, chest pain and fatigue.  G.I. bleed-- appears chronic slow G.I. bleed iron deficiency anemia -- unclear etiology -- came in with hemoglobin of 4.7. And dark colored stools  --no baseline hemoglobin on record here and at [facility] -- patient came in with increasing shortness of breath and weakness --- hemoglobin 4.7--- three unit blood transfusion 8.0-- 7.8 -- patient denies taking any blood thinners or anticoagulation. -- Seen by G.I. Dr. Haig Prophet plans on EGD and colonoscopy tomorrow -- continue Protonix -- transfuse if hemoglobin less than 7.0 -- patient will benefit from IV iron. Will start IV venofer here. Patient  will follow-up with PCP and hematology -- 12/5--EGD and colonoscopy canceled today due to poor prep  Hyperlipidemia -- continue statins  type II diabetes with neuropathy -- continue sliding scale insulin -- patient takes glipizide and metformin at home     Procedures: Family communication :wife Consults :GI CODE STATUS: FULL DVT Prophylaxis :SCD Level of care: Med-Surg Status is: Observation  The patient remains OBS appropriate and will d/c before 2  midnights. W/u for anemia      TOTAL TIME TAKING CARE OF THIS PATIENT: 30 minutes.  >50% time spent on counselling and coordination of care  Note: This dictation was prepared with Dragon dictation along with smaller phrase technology. Any transcriptional errors that result from this process are unintentional.  Fritzi Mandes M.D    [facility]   CC: Primary care physician; Alene Mires Elyse Jarvis, MD Patient ID: [name], male   DOB: 26-Sep-1966, 55 y.o.   MRN: [id number]

## 2021-11-14 ENCOUNTER — Inpatient Hospital Stay: Payer: Self-pay | Admitting: Anesthesiology

## 2021-11-14 ENCOUNTER — Encounter: Payer: Self-pay | Admitting: Internal Medicine

## 2021-11-14 ENCOUNTER — Encounter
Admission: EM | Disposition: A | Discharge status: Home / Self Care | Payer: Self-pay | Source: Home / Self Care | Attending: Internal Medicine

## 2021-11-14 DIAGNOSIS — K2289 Other specified disease of esophagus: Secondary | ICD-10-CM

## 2021-11-14 DIAGNOSIS — D508 Other iron deficiency anemias: Secondary | ICD-10-CM

## 2021-11-14 HISTORY — PX: ESOPHAGOGASTRODUODENOSCOPY (EGD) WITH PROPOFOL: SHX5813

## 2021-11-14 HISTORY — PX: COLONOSCOPY WITH PROPOFOL: SHX5780

## 2021-11-14 LAB — GLUCOSE, CAPILLARY
Glucose-Capillary: 123 mg/dL — ABNORMAL HIGH (ref 70–99)
Glucose-Capillary: 119 mg/dL — ABNORMAL HIGH (ref 70–99)
Glucose-Capillary: 167 mg/dL — ABNORMAL HIGH (ref 70–99)
Glucose-Capillary: 222 mg/dL — ABNORMAL HIGH (ref 70–99)

## 2021-11-14 LAB — VITAMIN B12: Vitamin B-12: 1595 pg/mL — ABNORMAL HIGH (ref 180–914)

## 2021-11-14 SURGERY — ESOPHAGOGASTRODUODENOSCOPY (EGD) WITH PROPOFOL
Anesthesia: General

## 2021-11-14 MED ORDER — POLYSACCHARIDE IRON COMPLEX 150 MG PO CAPS
150.0000 mg | ORAL_CAPSULE | Freq: Every day | ORAL | Status: DC
  Administered 2021-11-14 – 2021-11-15 (×2): 150 mg via ORAL
  Filled 2021-11-14 (×2): qty 1

## 2021-11-14 MED ORDER — PROPOFOL 500 MG/50ML IV EMUL
INTRAVENOUS | Status: DC | PRN
  Administered 2021-11-14: 175 ug/kg/min via INTRAVENOUS

## 2021-11-14 MED ORDER — LIDOCAINE HCL (CARDIAC) PF 100 MG/5ML IV SOSY
PREFILLED_SYRINGE | INTRAVENOUS | Status: DC | PRN
  Administered 2021-11-14: 50 mg via INTRAVENOUS

## 2021-11-14 MED ORDER — PHENYLEPHRINE HCL (PRESSORS) 10 MG/ML IV SOLN
INTRAVENOUS | Status: DC | PRN
  Administered 2021-11-14: 80 ug via INTRAVENOUS
  Administered 2021-11-14 (×2): 160 ug via INTRAVENOUS

## 2021-11-14 MED ORDER — PROPOFOL 10 MG/ML IV BOLUS
INTRAVENOUS | Status: DC | PRN
  Administered 2021-11-14 (×2): 50 mg via INTRAVENOUS

## 2021-11-14 MED ORDER — DEXMEDETOMIDINE (PRECEDEX) IN NS 20 MCG/5ML (4 MCG/ML) IV SYRINGE
PREFILLED_SYRINGE | INTRAVENOUS | Status: DC | PRN
  Administered 2021-11-14: 8 ug via INTRAVENOUS

## 2021-11-14 NOTE — Transfer of Care (Signed)
Immediate Anesthesia Transfer of Care Note  Patient: Samuel Pennington  Procedure(s) Performed: ESOPHAGOGASTRODUODENOSCOPY (EGD) WITH PROPOFOL COLONOSCOPY WITH PROPOFOL  Patient Location: PACU  Anesthesia Type:General  Level of Consciousness: sedated  Airway & Oxygen Therapy: Patient Spontanous Breathing  Post-op Assessment: Report given to RN and Post -op Vital signs reviewed and stable  Post vital signs: Reviewed and stable  Last Vitals:  Vitals Value Taken Time  BP 82/50 11/14/21 1300  Temp    Pulse 68 11/14/21 1300  Resp 13 11/14/21 1300  SpO2 100 % 11/14/21 1300    Last Pain:  Vitals:   11/14/21 1300  TempSrc: Temporal  PainSc:       Patients Stated Pain Goal: 2 (86/77/37 3668)  Complications: No notable events documented.

## 2021-11-14 NOTE — Op Note (Signed)
[facility] Gastroenterology Patient Name: Samuel Pennington Procedure Date: 11/14/2021 12:20 PM MRN: [id number] Account #: [id number] Date of Birth: [birth year] Admit Type: Inpatient Age: 55 Room: [facility] ENDO ROOM 1 Gender: Male Note Status: Finalized Instrument Name: Upper Endoscope [id number] Procedure:             Upper GI endoscopy Indications:           Iron deficiency anemia Providers:             Andrey Farmer MD, MD Referring MD:          Elyse Jarvis Revelo (Referring MD) Medicines:             Monitored Anesthesia Care Complications:         No immediate complications. Estimated blood loss:                         Minimal. Procedure:             Pre-Anesthesia Assessment:                        - Prior to the procedure, a History and Physical was                         performed, and patient medications and allergies were                         reviewed. The patient is competent. The risks and                         benefits of the procedure and the sedation options and                         risks were discussed with the patient. All questions                         were answered and informed consent was obtained.                         Patient identification and proposed procedure were                         verified by the physician, the nurse, the anesthetist                         and the technician in the endoscopy suite. Mental                         Status Examination: alert and oriented. Airway                         Examination: normal oropharyngeal airway and neck                         mobility. Respiratory Examination: clear to                         auscultation. CV Examination: normal. Prophylactic  Antibiotics: The patient does not require prophylactic                         antibiotics. Prior Anticoagulants: The patient has                         taken no previous anticoagulant or antiplatelet                          agents. ASA Grade Assessment: II - A patient with mild                         systemic disease. After reviewing the risks and                         benefits, the patient was deemed in satisfactory                         condition to undergo the procedure. The anesthesia                         plan was to use monitored anesthesia care (MAC).                         Immediately prior to administration of medications,                         the patient was re-assessed for adequacy to receive                         sedatives. The heart rate, respiratory rate, oxygen                         saturations, blood pressure, adequacy of pulmonary                         ventilation, and response to care were monitored                         throughout the procedure. The physical status of the                         patient was re-assessed after the procedure.                        After obtaining informed consent, the endoscope was                         passed under direct vision. Throughout the procedure,                         the patient's blood pressure, pulse, and oxygen                         saturations were monitored continuously. The Endoscope                         was introduced through the mouth, and advanced to the  second part of duodenum. The upper GI endoscopy was                         accomplished without difficulty. The patient tolerated                         the procedure well. Findings:      A large, fungating mass with no bleeding and no stigmata of recent       bleeding was found in the middle third of the esophagus and in the lower       third of the esophagus. The mass was partially obstructing and partially       circumferential (involving two thirds of the lumen circumference).       Biopsies were taken with a cold forceps for histology. Estimated blood       loss was minimal. The mass appeared to be about 10 cm in  length (from 38       cm to 28 cm). It did not appear to involve GEJ but difficult to       ascertain due to blood.      The entire examined stomach was normal.      A single 10 mm nodule with a localized distribution was found in the       second portion of the duodenum. Prior to taking biopsies, forceps were       throughly washed twice to minimize chance of cross contamination from       esophageal mass biopsies. Biopsies were taken with a cold forceps for       histology. Estimated blood loss was minimal. 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 Biopsied.                        - Normal stomach.                        - Nodule found in the duodenum. Biopsied. Recommendation:        - Await pathology results.                        - Perform a colonoscopy today. Procedure Code(s):     --- Professional ---                        [phone], Esophagogastroduodenoscopy, flexible,                         transoral; with biopsy, single or multiple Diagnosis Code(s):     --- Professional ---                        D49.0, Neoplasm of unspecified behavior of digestive  system                        K31.89, Other diseases of stomach and duodenum                        D50.9, Iron deficiency anemia, unspecified CPT copyright 2019 American Medical Association. All rights reserved. The codes documented in this report are preliminary and upon coder review may  be revised to meet current compliance requirements. Andrey Farmer MD, MD 11/14/2021 1:01:51 PM Number of Addenda: 0 Note Initiated On: 11/14/2021 12:20 PM Estimated Blood Loss:  Estimated blood loss was minimal.      [facility]

## 2021-11-14 NOTE — Anesthesia Postprocedure Evaluation (Signed)
Anesthesia Post Note  Patient: Tobby Fawcett  Procedure(s) Performed: ESOPHAGOGASTRODUODENOSCOPY (EGD) WITH PROPOFOL COLONOSCOPY WITH PROPOFOL  Patient location during evaluation: PACU Anesthesia Type: General Level of consciousness: awake and alert Pain management: pain level controlled Vital Signs Assessment: post-procedure vital signs reviewed and stable Respiratory status: spontaneous breathing, nonlabored ventilation, respiratory function stable and patient connected to nasal cannula oxygen Cardiovascular status: blood pressure returned to baseline and stable Postop Assessment: no apparent nausea or vomiting Anesthetic complications: no   No notable events documented.   Last Vitals:  Vitals:   11/14/21 1330 11/14/21 1340  BP: (!) 109/57 113/61  Pulse: 78 63  Resp: 14 13  Temp:    SpO2: 100% 100%    Last Pain:  Vitals:   11/14/21 1340  TempSrc:   PainSc: 0-No pain                 [place]

## 2021-11-14 NOTE — Progress Notes (Signed)
Patient for EGD/Colonoscopy this am; found drinking at 0600. Reiterated that he was to be NPO after midnight. "Oh, I thought I was supposed to finish".  Dr. Haig Prophet, GI, notified; aware; no new orders. NPO. Barbaraann Faster, RN 6:34 AM 11/14/2021

## 2021-11-14 NOTE — Progress Notes (Signed)
[facility] [place] NAME: Samuel Pennington    MR#:  [id number]  DATE OF BIRTH:  [birth year]  SUBJECTIVE:  patient tolerating clear liquids well. Denies any abdominal pain chest pain or shortness of breath. Came in with hemoglobin of 4.7. He is status post three unit blood transfusion.   Patient completed prep second time. status post EGD and colonoscopy  REVIEW OF SYSTEMS:   Review of Systems  Constitutional:  Negative for chills, fever and weight loss.  HENT:  Negative for ear discharge, ear pain and nosebleeds.   Eyes:  Negative for blurred vision, pain and discharge.  Respiratory:  Negative for sputum production, shortness of breath, wheezing and stridor.   Cardiovascular:  Negative for chest pain, palpitations, orthopnea and PND.  Gastrointestinal:  Negative for abdominal pain, diarrhea, nausea and vomiting.  Genitourinary:  Negative for frequency and urgency.  Musculoskeletal:  Negative for back pain and joint pain.  Neurological:  Positive for weakness. Negative for sensory change, speech change and focal weakness.  Psychiatric/Behavioral:  Negative for depression and hallucinations. The patient is not nervous/anxious.   Tolerating Diet: Tolerating PT:   DRUG ALLERGIES:  No Known Allergies  VITALS:  Blood pressure 107/67, pulse 72, temperature 97.6 F (36.4 C), temperature source Oral, resp. rate 20, height 5\' 6"  (1.676 m), weight 66.6 kg, SpO2 100 %.  PHYSICAL EXAMINATION:   Physical Exam  GENERAL:  55 y.o.-year-old patient lying in the bed with no acute distress.  HEENT: Head atraumatic, normocephalic. Oropharynx and nasopharynx clear. Pallor+ LUNGS: Normal breath sounds bilaterally, no wheezing, rales, rhonchi. No use of accessory muscles of respiration.  CARDIOVASCULAR: S1, S2 normal. No murmurs, rubs, or gallops.  ABDOMEN: Soft, nontender, nondistended. Bowel sounds present. No organomegaly or mass.  EXTREMITIES: No  cyanosis, clubbing or edema b/l.    NEUROLOGIC: nonfocal PSYCHIATRIC:  patient is alert and oriented x 3.  SKIN: No obvious rash, lesion, or ulcer.   LABORATORY PANEL:  CBC Recent Labs  Lab 11/13/21 0538  WBC 6.4  HGB 7.9*  HCT 24.2*  PLT 340     Chemistries  Recent Labs  Lab 11/11/21 0753 11/11/21 0900  NA 131*  --   K 3.8  --   CL 99  --   CO2 25  --   GLUCOSE 335*  --   BUN 12  --   CREATININE 0.72  --   CALCIUM 8.2*  --   AST  --  19  ALT  --  19  ALKPHOS  --  54  BILITOT  --  0.5    Cardiac Enzymes No results for input(s): TROPONINI in the last 168 hours. RADIOLOGY:  No results found. ASSESSMENT AND PLAN:  Bertin Inabinet is a 55 y.o. male with medical history significant of hyperlipidemia, diabetes mellitus, diabetic foot ulcer, history of lumbar radiculopathy, who presents with dark stool, chest pain and fatigue.  G.I. bleed-- appears chronic slow G.I. bleed iron deficiency anemia -- unclear etiology -- came in with hemoglobin of 4.7. And dark colored stools  --no baseline hemoglobin on record here and at [facility] -- patient came in with increasing shortness of breath and weakness --- hemoglobin 4.7--- three unit blood transfusion 8.0-- 7.8 -- patient denies taking any blood thinners or anticoagulation. -- Seen by G.I. Dr. Haig Prophet plans on EGD and colonoscopy tomorrow -- continue Protonix -- transfuse if hemoglobin less than 7.0 -- patient will benefit from IV iron. Will start  IV venofer here. 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 Biopsied.                        - Normal stomach.                        - Nodule found in the duodenum. Colonoscopy One 1 mm polyp in the transverse  colon, removed with                         a jumbo cold forceps. Resected and retrieved.                        - Internal hemorrhoids.                        - The examination was otherwise normal on direct and                         retroflexion views. -- Oncology consultation with Dr. Grayland Ormond aware   Hyperlipidemia -- continue statins  type II diabetes with neuropathy -- continue sliding scale insulin -- patient takes glipizide and metformin at home     Procedures: EGD colonoscopy Family communication :wife Consults :GI CODE STATUS: FULL DVT Prophylaxis :SCD Level of care: Med-Surg  oncology consultation for esophageal mass noted on EGD     TOTAL TIME TAKING CARE OF THIS PATIENT: 25 minutes.  >50% time spent on counselling and coordination of care  Note: This dictation was prepared with Dragon dictation along with smaller phrase technology. Any transcriptional errors that result from this process are unintentional.  Fritzi Mandes M.D    [facility]   CC: Primary care physician; Alene Mires Elyse Jarvis, MD Patient ID: [name], male   DOB: Dec 24, 1965, 55 y.o.   MRN: [id number]

## 2021-11-14 NOTE — Anesthesia Procedure Notes (Signed)
Date/Time: 11/14/2021 12:21 PM Performed by: Johnna Acosta, CRNA Pre-anesthesia Checklist: Patient identified, Emergency Drugs available, Suction available, Patient being monitored and Timeout performed Patient Re-evaluated:Patient Re-evaluated prior to induction Oxygen Delivery Method: Nasal cannula Preoxygenation: Pre-oxygenation with 100% oxygen Induction Type: IV induction

## 2021-11-14 NOTE — Anesthesia Preprocedure Evaluation (Addendum)
Anesthesia Evaluation  Patient identified by MRN, date of birth, ID band Patient awake    Reviewed: Allergy & Precautions, NPO status , Patient's Chart, lab work & pertinent test results  History of Anesthesia Complications Negative for: history of anesthetic complications  Airway Mallampati: II  TM Distance: >3 FB Neck ROM: Full    Dental   Pulmonary neg pulmonary ROS,    Pulmonary exam normal        Cardiovascular Normal cardiovascular exam    HLD   Neuro/Psych   LBP negative psych ROS   GI/Hepatic Neg liver ROS,   GI bleed   Endo/Other  diabetes, Type 2, Insulin Dependent  Renal/GU negative Renal ROS     Musculoskeletal   Diabetic foot ulcer   Abdominal   Peds  Hematology  (+) anemia ,   Admitted with hgb 4.7, s/p transfusion.  Hgb 7.9 on 11/14/21   Anesthesia Other Findings EKG 11/11/21 ST 110  Reproductive/Obstetrics                            Anesthesia Physical Anesthesia Plan  ASA: 3  Anesthesia Plan: General   Post-op Pain Management:    Induction:   PONV Risk Score and Plan:   Airway Management Planned: Natural Airway  Additional Equipment:   Intra-op Plan:   Post-operative Plan:   Informed Consent: I have reviewed the patients History and Physical, chart, labs and discussed the procedure including the risks, benefits and alternatives for the proposed anesthesia with the patient or authorized representative who has indicated his/her understanding and acceptance.     Interpreter used for [employer] Discussed with: CRNA  Anesthesia Plan Comments:         Anesthesia Quick Evaluation

## 2021-11-14 NOTE — Op Note (Signed)
[facility] Gastroenterology Patient Name: Samuel Pennington Procedure Date: 11/14/2021 12:19 PM MRN: [id number] Account #: [id number] Date of Birth: 04-Jul-1966 Admit Type: Inpatient Age: 55 Room: [facility] ENDO ROOM 1 Gender: Male Note Status: Finalized Instrument Name: Colonscope [id number] Procedure:             Colonoscopy Indications:           Iron deficiency anemia Providers:             Andrey Farmer MD, MD Referring MD:          Elyse Jarvis Revelo (Referring MD) Medicines:             Monitored Anesthesia Care Complications:         No immediate complications. Estimated blood loss:                         Minimal. Procedure:             Pre-Anesthesia Assessment:                        - Prior to the procedure, a History and Physical was                         performed, and patient medications and allergies were                         reviewed. The patient is competent. The risks and                         benefits of the procedure and the sedation options and                         risks were discussed with the patient. All questions                         were answered and informed consent was obtained.                         Patient identification and proposed procedure were                         verified by the physician, the nurse, the anesthetist                         and the technician in the endoscopy suite. Mental                         Status Examination: alert and oriented. Airway                         Examination: normal oropharyngeal airway and neck                         mobility. Respiratory Examination: clear to                         auscultation. CV Examination: normal. Prophylactic  Antibiotics: The patient does not require prophylactic                         antibiotics. Prior Anticoagulants: The patient has                         taken no previous anticoagulant or antiplatelet                          agents. ASA Grade Assessment: II - A patient with mild                         systemic disease. After reviewing the risks and                         benefits, the patient was deemed in satisfactory                         condition to undergo the procedure. The anesthesia                         plan was to use monitored anesthesia care (MAC).                         Immediately prior to administration of medications,                         the patient was re-assessed for adequacy to receive                         sedatives. The heart rate, respiratory rate, oxygen                         saturations, blood pressure, adequacy of pulmonary                         ventilation, and response to care were monitored                         throughout the procedure. The physical status of the                         patient was re-assessed after the procedure.                        After obtaining informed consent, the colonoscope was                         passed under direct vision. Throughout the procedure,                         the patient's blood pressure, pulse, and oxygen                         saturations were monitored continuously. The                         Colonoscope was introduced through the anus and  advanced to the the cecum, identified by appendiceal                         orifice and ileocecal valve. The colonoscopy was                         somewhat difficult due to a tortuous colon. The                         patient tolerated the procedure well. The quality of                         the bowel preparation was fair. Findings:      The perianal and digital rectal examinations were normal.      A 1 mm polyp was found in the transverse colon. The polyp was sessile.       The polyp was removed with a jumbo cold forceps. Resection and retrieval       were complete. Estimated blood loss was minimal.      Internal hemorrhoids were found  during retroflexion. The hemorrhoids       were Grade I (internal hemorrhoids that do not prolapse).      The exam was otherwise without abnormality on direct and retroflexion       views. Impression:            - Preparation of the colon was fair.                        - One 1 mm polyp in the transverse colon, removed with                         a jumbo cold forceps. Resected and retrieved.                        - Internal hemorrhoids.                        - The examination was otherwise normal on direct and                         retroflexion views. Recommendation:        - Return patient to hospital ward for ongoing care.                        - Resume previous diet.                        - Continue present medications.                        - Await pathology results. Procedure Code(s):     --- Professional ---                        [phone], Colonoscopy, flexible; with biopsy, single or                         multiple Diagnosis Code(s):     --- Professional ---  K64.0, First degree hemorrhoids                        K63.5, Polyp of colon                        D50.9, Iron deficiency anemia, unspecified CPT copyright 2019 American Medical Association. All rights reserved. The codes documented in this report are preliminary and upon coder review may  be revised to meet current compliance requirements. Andrey Farmer MD, MD 11/14/2021 1:04:16 PM Number of Addenda: 0 Note Initiated On: 11/14/2021 12:19 PM Scope Withdrawal Time: 0 hours 9 minutes 28 seconds  Total Procedure Duration: 0 hours 14 minutes 24 seconds  Estimated Blood Loss:  Estimated blood loss was minimal.      [facility]

## 2021-11-14 NOTE — Care Plan (Signed)
EGD showed esophageal mass. Biopsied. Recommend pan CT scan. Will need to f/u biopsies and likely heme/onc referral. Colonoscopy unremarkable.  Raylene Miyamoto MD, MPH [place]

## 2021-11-15 ENCOUNTER — Inpatient Hospital Stay: Payer: Self-pay

## 2021-11-15 ENCOUNTER — Encounter: Payer: Self-pay | Admitting: Gastroenterology

## 2021-11-15 DIAGNOSIS — C159 Malignant neoplasm of esophagus, unspecified: Principal | ICD-10-CM

## 2021-11-15 DIAGNOSIS — K922 Gastrointestinal hemorrhage, unspecified: Secondary | ICD-10-CM

## 2021-11-15 LAB — CBC
HCT: 27.5 % — ABNORMAL LOW (ref 39.0–52.0)
Hemoglobin: 8.8 g/dL — ABNORMAL LOW (ref 13.0–17.0)
MCH: 28.4 pg (ref 26.0–34.0)
MCHC: 32 g/dL (ref 30.0–36.0)
MCV: 88.7 fL (ref 80.0–100.0)
Platelets: 414 10*3/uL — ABNORMAL HIGH (ref 150–400)
RBC: 3.1 MIL/uL — ABNORMAL LOW (ref 4.22–5.81)
RDW: 15 % (ref 11.5–15.5)
WBC: 7.1 10*3/uL (ref 4.0–10.5)
nRBC: 0 % (ref 0.0–0.2)

## 2021-11-15 LAB — BASIC METABOLIC PANEL
Anion gap: 6 (ref 5–15)
BUN: 7 mg/dL (ref 6–20)
CO2: 26 mmol/L (ref 22–32)
Calcium: 8.6 mg/dL — ABNORMAL LOW (ref 8.9–10.3)
Chloride: 103 mmol/L (ref 98–111)
Creatinine, Ser: 0.76 mg/dL (ref 0.61–1.24)
GFR, Estimated: >60 mL/min (ref >60)
Glucose, Bld: 241 mg/dL — ABNORMAL HIGH (ref 70–99)
Potassium: 3.6 mmol/L (ref 3.5–5.1)
Sodium: 135 mmol/L (ref 135–145)

## 2021-11-15 LAB — GLUCOSE, CAPILLARY
Glucose-Capillary: 106 mg/dL — ABNORMAL HIGH (ref 70–99)
Glucose-Capillary: 223 mg/dL — ABNORMAL HIGH (ref 70–99)

## 2021-11-15 MED ORDER — ACYCLOVIR 400 MG PO TABS: 400.0000 mg | ORAL_TABLET | Freq: Every day | ORAL | 0 refills | Status: AC

## 2021-11-15 MED ORDER — IOHEXOL 300 MG/ML  SOLN
100.0000 mL | Freq: Once | INTRAMUSCULAR | Status: AC | PRN
  Administered 2021-11-15: 100 mL via INTRAVENOUS

## 2021-11-15 MED ORDER — IOHEXOL 9 MG/ML PO SOLN
500.0000 mL | ORAL | Status: AC
  Administered 2021-11-15 (×2): 500 mL via ORAL

## 2021-11-15 NOTE — Progress Notes (Signed)
Patient discharged with no complications. Patient discharge instructions were given and supplies needed for discharge provided. Patient had no questions and discharged with all belongings to home via transport.

## 2021-11-15 NOTE — Discharge Summary (Signed)
Samuel Pennington YQM:[id number] DOB: Mar 11, 1966 DOA: 11/11/2021  PCP: Theotis Burrow, MD  Admit date: 11/11/2021 Discharge date: 11/15/2021  Time spent: 45 minutes  Recommendations for Outpatient Follow-up:  Oncology f/u 1 week     Discharge Diagnoses:  Principal Problem:   GI bleeding Active Problems:   Diabetes mellitus without complication ([place])   Hyperlipidemia   Chest pain   Acute blood loss anemia   Blurry vision   Iron deficiency anemia   GIB (gastrointestinal bleeding)   Esophageal mass   Discharge Condition: stable  Diet recommendation: carb modified  Filed Weights   11/11/21 0754 11/13/21 1203  Weight: 72.6 kg 66.6 kg    History of present illness:  Samuel Pennington is a 55 y.o. male with medical history significant of hyperlipidemia, diabetes mellitus, diabetic foot ulcer, neuromuscular disorder, who presents with dark stool, chest pain and fatigue.   Patient speaks Spanish, history is obtained with help of iPad interpreter.  Patient states that he has intermittent dark stool which has been going on for almost 2 weeks.  Patient does not have abdominal pain or diarrhea.  He has mild nausea sometimes.  He states that he occasionally drinks alcohol, but denies heavy drinking.  He is not taking NSAIDs. He has fatigue and generalized weakness recently. He also reports exertional shortness of breath and intermittent chest pain.  His chest pain is located in left side of the chest, 7 out of 10 in severity sometimes, currently mild 1-2 out of 10 in severity, sometimes radiating to the back per pt. Patient cannot clearly tell if his chest pain is pleuritic or exertional. No cough, fever or chills.  He feels having a lump in left side of chest when chest pain happens. He states that he had 1 episode of dark/blurry vision when he stood up last night at home. No fall or LOC. No facial droop or slurred speech.  No unilateral numbness or tinglings in extremities. No  symptoms of UTI.  Hospital Course:  Patient presents with weeks to months of fatigue, chest pain, odynophagia. Found to be severely anemic (hgb 4.7). colonoscopy with one diminutive polyp, biopsy pending. EGD with esophageal mass highly concerning for malignancy. Initial path per GI is showing malignancy, also showing HSV esophagitis. Hiv is negative. Was transfused 3 units and hgb improved to 8s and remained there. Patient is tolerating by mouth. Seen by oncology, who advised CT chest/abdomen/pelvis which showed no signs metastatic disease. Plan is 14 days oral acyclovir for hsv esophagitis (longer than normal course given presumed immune suppression from malignancy) and close f/u in 1 week w/ Dr. Grayland Ormond.  Procedures: Colonoscopy, upper endoscopy   Consultations: GI, oncology  Discharge Exam: Vitals:   11/15/21 0700 11/15/21 1527  BP: 103/64 115/63  Pulse: 68 72  Resp: 18 18  Temp: 98 F (36.7 C) (!) 97.5 F (36.4 C)  SpO2: 98% 100%    General: NAD Cardiovascular: RRR Respiratory: CTAB ABdomen: soft, non-tender  Discharge Instructions   Discharge Instructions     Diet - low sodium heart healthy   Complete by: As directed    Increase activity slowly   Complete by: As directed       Allergies as of 11/15/2021   No Known Allergies      Medication List     TAKE these medications    acyclovir 400 MG tablet Commonly known as: Zovirax Take 1 tablet (400 mg total) by mouth 5 (five) times daily for 14 days.  atorvastatin 40 MG tablet Commonly known as: LIPITOR TOMA UNA TABLETA POR BOCA AL ACOSTARSE PARA COLESTEROL ALTO   glipiZIDE 5 MG 24 hr tablet Commonly known as: GLUCOTROL XL TOME 1 TABLETA POR BOCA DIARIO PARA LA DIABETES   metFORMIN 1000 MG tablet Commonly known as: GLUCOPHAGE Take 1,000 mg by mouth daily.   oxyCODONE 5 MG immediate release tablet Commonly known as: Oxy IR/ROXICODONE Take 5 mg by mouth every 6 (six) hours as needed for pain.        No Known Allergies  Follow-up Information     Revelo, Elyse Jarvis, MD Follow up.   Specialty: Family Medicine Contact information: [address] [place] [address] [phone]         Lloyd Huger, MD. Schedule an appointment as soon as possible for a visit in 1 week(s).   Specialty: Oncology Contact information: [place] [address] [phone]                  The results of significant diagnostics from this hospitalization (including imaging, microbiology, ancillary and laboratory) are listed below for reference.    Significant Diagnostic Studies: DG Chest 2 View  Result Date: 11/11/2021 CLINICAL DATA:  Chest pain.  Sternal pain. EXAM: CHEST - 2 VIEW COMPARISON:  None. FINDINGS: Heart size and mediastinal contours are within normal limits. Lungs are clear. No pleural effusion or pneumothorax is seen. Osseous structures about the chest are unremarkable. IMPRESSION: Negative chest x-ray.  No active cardiopulmonary disease. Electronically Signed   By: Franki Cabot M.D.   On: 11/11/2021 08:24   CT CHEST ABDOMEN PELVIS W CONTRAST  Result Date: 11/15/2021 CLINICAL DATA:  Esophageal cancer. EXAM: CT CHEST, ABDOMEN, AND PELVIS WITH CONTRAST TECHNIQUE: Multidetector CT imaging of the chest, abdomen and pelvis was performed following the standard protocol during bolus administration of intravenous contrast. CONTRAST:  178mL OMNIPAQUE IOHEXOL 300 MG/ML  SOLN COMPARISON:  None. FINDINGS: CT CHEST FINDINGS Cardiovascular: Coronary artery calcification. Heart is at the upper limits of normal in size. No pericardial effusion. Mediastinum/Nodes: No pathologically enlarged mediastinal, hilar or axillary lymph nodes. No periesophageal lymph nodes. Masslike thickening of the distal esophagus, difficult to measure. Lungs/Pleura: Lungs are clear. No pleural fluid. Airway is unremarkable. Musculoskeletal: No worrisome lytic or sclerotic lesions.  CT ABDOMEN PELVIS FINDINGS Hepatobiliary: Liver and gallbladder are unremarkable. No biliary ductal dilatation. Pancreas: Negative. Spleen: Negative. Adrenals/Urinary Tract: Adrenal glands and kidneys are unremarkable. Ureters are decompressed. Bladder is grossly unremarkable. Stomach/Bowel: Masslike thickening at the gastroesophageal junction. Stomach and small bowel, appendix and colon are otherwise unremarkable. Vascular/Lymphatic: Atherosclerotic calcification of the aorta. Upper abdominal lymph nodes are not enlarged by CT size criteria. No pathologically enlarged lymph nodes. Reproductive: Prostate is enlarged. Other: No free fluid.  Mesenteries and peritoneum are unremarkable. Musculoskeletal: No worrisome lytic or sclerotic lesions. IMPRESSION: 1. Distal esophageal/gastroesophageal junction mass without evidence of metastatic disease. 2. Enlarged prostate. 3. Aortic atherosclerosis (ICD10-I70.0). Coronary artery calcification. Electronically Signed   By: Lorin Picket M.D.   On: 11/15/2021 15:11    Microbiology: Recent Results (from the past 240 hour(s))  Resp Panel by RT-PCR (Flu A&B, Covid) Nasopharyngeal Swab     Status: None   Collection Time: 11/11/21  9:00 AM   Specimen: Nasopharyngeal Swab; Nasopharyngeal(NP) swabs in vial transport medium  Result Value Ref Range Status   SARS Coronavirus 2 by RT PCR NEGATIVE NEGATIVE Final    Comment: (NOTE) SARS-CoV-2 target nucleic acids are NOT DETECTED.  The SARS-CoV-2 RNA is generally detectable in upper respiratory specimens during the acute phase of infection. The lowest concentration of SARS-CoV-2 viral copies this assay can detect is 138 copies/mL. A negative result does not preclude SARS-Cov-2 infection and should not be used as the sole basis for treatment or other patient management decisions. A negative result may occur with  improper specimen collection/handling, submission of specimen other than nasopharyngeal swab, presence of  viral mutation(s) within the areas targeted by this assay, and inadequate number of viral copies(<138 copies/mL). A negative result must be combined with clinical observations, patient history, and epidemiological information. The expected result is Negative.  Fact Sheet for Patients:  EntrepreneurPulse.com.au  Fact Sheet for Healthcare Providers:  IncredibleEmployment.be  This test is no t yet approved or cleared by the [place] FDA and  has been authorized for detection and/or diagnosis of SARS-CoV-2 by FDA under an Emergency Use Authorization (EUA). This EUA will remain  in effect (meaning this test can be used) for the duration of the COVID-19 declaration under Section 564(b)(1) of the Act, 21 U.S.C.section 360bbb-3(b)(1), unless the authorization is terminated  or revoked sooner.       Influenza A by PCR NEGATIVE NEGATIVE Final   Influenza B by PCR NEGATIVE NEGATIVE Final    Comment: (NOTE) The Xpert Xpress SARS-CoV-2/FLU/RSV plus assay is intended as an aid in the diagnosis of influenza from Nasopharyngeal swab specimens and should not be used as a sole basis for treatment. Nasal washings and aspirates are unacceptable for Xpert Xpress SARS-CoV-2/FLU/RSV testing.  Fact Sheet for Patients: EntrepreneurPulse.com.au  Fact Sheet for Healthcare Providers: IncredibleEmployment.be  This test is not yet approved or cleared by the [place] FDA and has been authorized for detection and/or diagnosis of SARS-CoV-2 by FDA under an Emergency Use Authorization (EUA). This EUA will remain in effect (meaning this test can be used) for the duration of the COVID-19 declaration under Section 564(b)(1) of the Act, 21 U.S.C. section 360bbb-3(b)(1), unless the authorization is terminated or revoked.  Performed at [facility], [address] [place], [place] [address]   MRSA Next Gen by PCR,  Nasal     Status: None   Collection Time: 11/13/21  3:53 AM   Specimen: Nasal Mucosa; Nasal Swab  Result Value Ref Range Status   MRSA by PCR Next Gen NOT DETECTED NOT DETECTED Final    Comment: (NOTE) The GeneXpert MRSA Assay (FDA approved for NASAL specimens only), is one component of a comprehensive MRSA colonization surveillance program. It is not intended to diagnose MRSA infection nor to guide or monitor treatment for MRSA infections. Test performance is not FDA approved in patients less than 31 years old. Performed at [facility], [address] [place], [place] [address]      Labs: Basic Metabolic Panel: Recent Labs  Lab 11/11/21 0753 11/15/21 1140  NA 131* 135  K 3.8 3.6  CL 99 103  CO2 25 26  GLUCOSE 335* 241*  BUN 12 7  CREATININE 0.72 0.76  CALCIUM 8.2* 8.6*   Liver Function Tests: Recent Labs  Lab 11/11/21 0900  AST 19  ALT 19  ALKPHOS 54  BILITOT 0.5  PROT 6.4*  ALBUMIN 3.0*   No results for input(s): LIPASE, AMYLASE in the last 168 hours. No results for input(s): AMMONIA in the last 168 hours. CBC: Recent Labs  Lab 11/12/21 1105 11/12/21 1707 11/12/21 2253 11/13/21 0538 11/15/21 1140  WBC 6.2 7.1 7.3 6.4 7.1  HGB 7.8*  8.5* 8.0* 7.9* 8.8*  HCT 23.5* 25.8* 24.1* 24.2* 27.5*  MCV 85.8 86.6 85.8 85.2 88.7  PLT 351 376 343 340 414*   Cardiac Enzymes: No results for input(s): CKTOTAL, CKMB, CKMBINDEX, TROPONINI in the last 168 hours. BNP: BNP (last 3 results) No results for input(s): BNP in the last 8760 hours.  ProBNP (last 3 results) No results for input(s): PROBNP in the last 8760 hours.  CBG: Recent Labs  Lab 11/14/21 1154 11/14/21 1633 11/14/21 2059 11/15/21 0746 11/15/21 1132  GLUCAP 119* 167* 222* 106* 223*       Signed:  Desma Maxim MD.  [facility] 11/15/2021, 3:43 PM

## 2021-11-15 NOTE — Consult Note (Signed)
[place]  [phone] [phone]  ID: Samuel Pennington OB: 1966-12-09  MR#: [id number]  FGH#:[id number]  Patient Care Team: Theotis Burrow, MD as PCP - General (Family Medicine)  CHIEF COMPLAINT: Esophageal adenocarcinoma.  INTERVAL HISTORY: Patient is a 55 year old male who initially presented to the emergency room with complaints of increasing weakness and fatigue, dark tarry stools, and chest pain.  Subsequent work-up revealed a hemoglobin of 4.7.  EGD revealed esophageal mass consistent with adenocarcinoma.  He does not endorse any weight loss.  He has no neurologic complaints.  He denies any recent fevers or illnesses.  He has no shortness of breath, cough, or hemoptysis.  He denies any nausea, vomiting, constipation, or diarrhea.  He has no urinary complaints.  Patient otherwise feels well and offers no further specific complaints today.  REVIEW OF SYSTEMS:   Review of Systems  Constitutional:  Positive for malaise/fatigue. Negative for fever and weight loss.  Respiratory: Negative.  Negative for cough, hemoptysis and shortness of breath.   Cardiovascular: Negative.  Negative for chest pain and leg swelling.  Gastrointestinal:  Positive for melena. Negative for abdominal pain and blood in stool.  Genitourinary: Negative.  Negative for hematuria.  Musculoskeletal: Negative.   Skin: Negative.  Negative for rash.  Neurological: Negative.  Negative for dizziness, focal weakness, weakness and headaches.  Psychiatric/Behavioral: Negative.  The patient is not nervous/anxious.    As per HPI. Otherwise, a complete review of systems is negative.  PAST MEDICAL HISTORY: Past Medical History:  Diagnosis Date   Diabetes mellitus without complication ([place])    Hyperlipidemia    Neuromuscular disorder ([place])     PAST SURGICAL HISTORY: Past Surgical History:  Procedure Laterality Date   COLONOSCOPY WITH PROPOFOL N/A 11/14/2021   Procedure:  COLONOSCOPY WITH PROPOFOL;  Surgeon: Lesly Rubenstein, MD;  Location: [facility] ENDOSCOPY;  Service: Endoscopy;  Laterality: N/A;   ESOPHAGOGASTRODUODENOSCOPY (EGD) WITH PROPOFOL N/A 11/14/2021   Procedure: ESOPHAGOGASTRODUODENOSCOPY (EGD) WITH PROPOFOL;  Surgeon: Lesly Rubenstein, MD;  Location: [facility] ENDOSCOPY;  Service: Endoscopy;  Laterality: N/A;    FAMILY HISTORY: Family History  Problem Relation Age of Onset   Hypertension Mother     ADVANCED DIRECTIVES (Y/N):  @ADVDIR @  HEALTH MAINTENANCE: Social History   Tobacco Use   Smoking status: Never   Smokeless tobacco: Never  Substance Use Topics   Alcohol use: Yes    Comment: occassionallh   Drug use: Never     Colonoscopy:  PAP:  Bone density:  Lipid panel:  No Known Allergies  Current Facility-Administered Medications  Medication Dose Route Frequency Provider Last Rate Last Admin   acetaminophen (TYLENOL) tablet 650 mg  650 mg Oral Q6H PRN Ivor Costa, MD       atorvastatin (LIPITOR) tablet 40 mg  40 mg Oral Daily Ivor Costa, MD   40 mg at 11/15/21 0827   diazepam (VALIUM) tablet 2 mg  2 mg Oral Q8H PRN Ivor Costa, MD   2 mg at 11/11/21 2224   insulin aspart (novoLOG) injection 0-5 Units  0-5 Units Subcutaneous QHS Ivor Costa, MD   2 Units at 11/14/21 2114   insulin aspart (novoLOG) injection 0-9 Units  0-9 Units Subcutaneous TID WC Ivor Costa, MD   3 Units at 11/15/21 1140   iron polysaccharides (NIFEREX) capsule 150 mg  150 mg Oral Daily Fritzi Mandes, MD   150 mg at 11/15/21 0827   nitroGLYCERIN (NITROSTAT) SL tablet 0.4 mg  0.4 mg Sublingual Q5  min PRN Ivor Costa, MD       ondansetron [facility]) injection 4 mg  4 mg Intravenous Q8H PRN Ivor Costa, MD       oxyCODONE (Oxy IR/ROXICODONE) immediate release tablet 5 mg  5 mg Oral Q6H PRN Ivor Costa, MD   5 mg at 11/15/21 1696   Current Outpatient Medications  Medication Sig Dispense Refill   acyclovir (ZOVIRAX) 400 MG tablet Take 1 tablet (400 mg total) by mouth 5 (five)  times daily for 14 days. 70 tablet 0   atorvastatin (LIPITOR) 40 MG tablet TOMA UNA TABLETA POR BOCA AL ACOSTARSE PARA COLESTEROL ALTO 90 tablet 3   glipiZIDE (GLUCOTROL XL) 5 MG 24 hr tablet TOME 1 TABLETA POR BOCA DIARIO PARA LA DIABETES 90 tablet 1   metFORMIN (GLUCOPHAGE) 1000 MG tablet Take 1,000 mg by mouth daily.     oxyCODONE (OXY IR/ROXICODONE) 5 MG immediate release tablet Take 5 mg by mouth every 6 (six) hours as needed for pain.      OBJECTIVE: Vitals:   11/15/21 0700 11/15/21 1527  BP: 103/64 115/63  Pulse: 68 72  Resp: 18 18  Temp: 98 F (36.7 C) (!) 97.5 F (36.4 C)  SpO2: 98% 100%     Body mass index is 23.69 kg/m.    ECOG FS:1 - Symptomatic but completely ambulatory  General: Well-developed, well-nourished, no acute distress. Eyes: Pink conjunctiva, anicteric sclera. HEENT: Normocephalic, moist mucous membranes. Lungs: No audible wheezing or coughing. Heart: Regular rate and rhythm. Abdomen: Soft, nontender, no obvious distention. Musculoskeletal: No edema, cyanosis, or clubbing. Neuro: Alert, answering all questions appropriately. Cranial nerves grossly intact. Skin: No rashes or petechiae noted. Psych: Normal affect. Lymphatics: No cervical, calvicular, axillary or inguinal LAD.   LAB RESULTS:  Lab Results  Component Value Date   NA 135 11/15/2021   K 3.6 11/15/2021   CL 103 11/15/2021   CO2 26 11/15/2021   GLUCOSE 241 (H) 11/15/2021   BUN 7 11/15/2021   CREATININE 0.76 11/15/2021   CALCIUM 8.6 (L) 11/15/2021   PROT 6.4 (L) 11/11/2021   ALBUMIN 3.0 (L) 11/11/2021   AST 19 11/11/2021   ALT 19 11/11/2021   ALKPHOS 54 11/11/2021   BILITOT 0.5 11/11/2021   GFRNONAA >60 11/15/2021    Lab Results  Component Value Date   WBC 7.1 11/15/2021   HGB 8.8 (L) 11/15/2021   HCT 27.5 (L) 11/15/2021   MCV 88.7 11/15/2021   PLT 414 (H) 11/15/2021     STUDIES: DG Chest 2 View  Result Date: 11/11/2021 CLINICAL DATA:  Chest pain.  Sternal pain. EXAM:  CHEST - 2 VIEW COMPARISON:  None. FINDINGS: Heart size and mediastinal contours are within normal limits. Lungs are clear. No pleural effusion or pneumothorax is seen. Osseous structures about the chest are unremarkable. IMPRESSION: Negative chest x-ray.  No active cardiopulmonary disease. Electronically Signed   By: Franki Cabot M.D.   On: 11/11/2021 08:24   CT CHEST ABDOMEN PELVIS W CONTRAST  Result Date: 11/15/2021 CLINICAL DATA:  Esophageal cancer. EXAM: CT CHEST, ABDOMEN, AND PELVIS WITH CONTRAST TECHNIQUE: Multidetector CT imaging of the chest, abdomen and pelvis was performed following the standard protocol during bolus administration of intravenous contrast. CONTRAST:  126mL OMNIPAQUE IOHEXOL 300 MG/ML  SOLN COMPARISON:  None. FINDINGS: CT CHEST FINDINGS Cardiovascular: Coronary artery calcification. Heart is at the upper limits of normal in size. No pericardial effusion. Mediastinum/Nodes: No pathologically enlarged mediastinal, hilar or axillary lymph nodes. No periesophageal lymph nodes. Masslike  thickening of the distal esophagus, difficult to measure. Lungs/Pleura: Lungs are clear. No pleural fluid. Airway is unremarkable. Musculoskeletal: No worrisome lytic or sclerotic lesions. CT ABDOMEN PELVIS FINDINGS Hepatobiliary: Liver and gallbladder are unremarkable. No biliary ductal dilatation. Pancreas: Negative. Spleen: Negative. Adrenals/Urinary Tract: Adrenal glands and kidneys are unremarkable. Ureters are decompressed. Bladder is grossly unremarkable. Stomach/Bowel: Masslike thickening at the gastroesophageal junction. Stomach and small bowel, appendix and colon are otherwise unremarkable. Vascular/Lymphatic: Atherosclerotic calcification of the aorta. Upper abdominal lymph nodes are not enlarged by CT size criteria. No pathologically enlarged lymph nodes. Reproductive: Prostate is enlarged. Other: No free fluid.  Mesenteries and peritoneum are unremarkable. Musculoskeletal: No worrisome lytic or  sclerotic lesions. IMPRESSION: 1. Distal esophageal/gastroesophageal junction mass without evidence of metastatic disease. 2. Enlarged prostate. 3. Aortic atherosclerosis (ICD10-I70.0). Coronary artery calcification. Electronically Signed   By: Lorin Picket M.D.   On: 11/15/2021 15:11    ASSESSMENT: Esophageal adenocarcinoma.  PLAN:    Esophageal adenocarcinoma: EGD and biopsy on November 15, 2021 confirmed adenocarcinoma.  CT scan of chest, abdomen, pelvis reviewed independently and reported as above with distal esophageal junction mass without evidence of metastatic disease.  Patient will require a PET scan to complete staging work-up.  This can be accomplished as an outpatient.  We will arrange follow-up in the cancer center in 1 week for continued diagnostic testing and treatment planning. Anemia: Secondary to underlying malignancy.  Patient's hemoglobin is improved to 8.8 with blood transfusions.  Continue to monitor closely. Thrombocytosis: Likely reactive secondary to anemia.  Monitor.  Appreciate consult, call with questions.   Lloyd Huger, MD   11/15/2021 6:57 PM

## 2021-11-16 ENCOUNTER — Other Ambulatory Visit: Payer: Self-pay | Admitting: Pathology

## 2021-11-16 LAB — SURGICAL PATHOLOGY

## 2021-11-23 ENCOUNTER — Encounter: Payer: Self-pay | Admitting: Oncology

## 2021-11-23 ENCOUNTER — Inpatient Hospital Stay: Payer: Self-pay

## 2021-11-23 ENCOUNTER — Inpatient Hospital Stay: Payer: Self-pay | Attending: Oncology | Admitting: Oncology

## 2021-11-23 ENCOUNTER — Other Ambulatory Visit: Payer: Self-pay

## 2021-11-23 VITALS — BP 101/56 | HR 79 | Temp 98.6°F | Ht 66.0 in | Wt 147.1 lb

## 2021-11-23 DIAGNOSIS — D630 Anemia in neoplastic disease: Secondary | ICD-10-CM | POA: Insufficient documentation

## 2021-11-23 DIAGNOSIS — R079 Chest pain, unspecified: Secondary | ICD-10-CM | POA: Insufficient documentation

## 2021-11-23 DIAGNOSIS — C159 Malignant neoplasm of esophagus, unspecified: Secondary | ICD-10-CM | POA: Insufficient documentation

## 2021-11-23 LAB — CBC WITH DIFFERENTIAL/PLATELET
Abs Immature Granulocytes: 0.03 10*3/uL (ref 0.00–0.07)
Basophils Absolute: 0 10*3/uL (ref 0.0–0.1)
Basophils Relative: 1 %
Eosinophils Absolute: 0.2 10*3/uL (ref 0.0–0.5)
Eosinophils Relative: 2 %
HCT: 28.3 % — ABNORMAL LOW (ref 39.0–52.0)
Hemoglobin: 8.8 g/dL — ABNORMAL LOW (ref 13.0–17.0)
Immature Granulocytes: 0 %
Lymphocytes Relative: 18 %
Lymphs Abs: 1.4 10*3/uL (ref 0.7–4.0)
MCH: 28.5 pg (ref 26.0–34.0)
MCHC: 31.1 g/dL (ref 30.0–36.0)
MCV: 91.6 fL (ref 80.0–100.0)
Monocytes Absolute: 0.5 10*3/uL (ref 0.1–1.0)
Monocytes Relative: 6 %
Neutro Abs: 5.5 10*3/uL (ref 1.7–7.7)
Neutrophils Relative %: 73 %
Platelets: 317 10*3/uL (ref 150–400)
RBC: 3.09 MIL/uL — ABNORMAL LOW (ref 4.22–5.81)
RDW: 16.9 % — ABNORMAL HIGH (ref 11.5–15.5)
WBC: 7.6 10*3/uL (ref 4.0–10.5)
nRBC: 0 % (ref 0.0–0.2)

## 2021-11-23 LAB — COMPREHENSIVE METABOLIC PANEL
ALT: 13 U/L (ref 0–44)
AST: 15 U/L (ref 15–41)
Albumin: 3.7 g/dL (ref 3.5–5.0)
Alkaline Phosphatase: 64 U/L (ref 38–126)
Anion gap: 11 (ref 5–15)
BUN: 17 mg/dL (ref 6–20)
CO2: 25 mmol/L (ref 22–32)
Calcium: 8.6 mg/dL — ABNORMAL LOW (ref 8.9–10.3)
Chloride: 99 mmol/L (ref 98–111)
Creatinine, Ser: 0.77 mg/dL (ref 0.61–1.24)
GFR, Estimated: >60 mL/min (ref >60)
Glucose, Bld: 165 mg/dL — ABNORMAL HIGH (ref 70–99)
Potassium: 4.3 mmol/L (ref 3.5–5.1)
Sodium: 135 mmol/L (ref 135–145)
Total Bilirubin: 0.4 mg/dL (ref 0.3–1.2)
Total Protein: 7.3 g/dL (ref 6.5–8.1)

## 2021-11-23 MED ORDER — HYDROCODONE-ACETAMINOPHEN 5-325 MG PO TABS: 1.0000 | ORAL_TABLET | Freq: Four times a day (QID) | ORAL | 0 refills | Status: DC | PRN

## 2021-11-23 NOTE — Progress Notes (Signed)
Met with [name] along with interpreter. Introduced Therapist, nutritional and provided contact information for future needs. Went over instructions for PET and EUS along with the interpreter. These will also be provided in spanish print. Encouraged to call with any questions.

## 2021-11-23 NOTE — Progress Notes (Signed)
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  INTERVAL HISTORY: Patient is a 55 year old male who was initially evaluated in the hospital who was admitted with significant weakness and fatigue, dark tarry stools, and chest pain.  He was found to have an hemoglobin of 4.7.  Subsequent EGD revealed esophageal mass consistent with a poorly differentiated squamous cell carcinoma.  Since discharge, he feels improved.  He continues to have mild chest pain.  He has no neurologic complaints.  He denies any recent fevers.  Right and has maintained his weight.  He has no shortness of breath, cough, or hemoptysis.  He denies any nausea, vomiting, constipation, or diarrhea.  He has no further melena or hematochezia.  He has no urinary complaints.  Patient otherwise feels well and offers no further specific complaints today.  REVIEW OF SYSTEMS:   Review of Systems  Constitutional: Negative.  Negative for fever, malaise/fatigue and weight loss.  Respiratory: Negative.  Negative for cough, hemoptysis and shortness of breath.   Cardiovascular:  Positive for chest pain. Negative for leg swelling.  Gastrointestinal: Negative.  Negative for abdominal pain, blood in stool and melena.  Genitourinary: Negative.  Negative for dysuria.  Musculoskeletal: Negative.  Negative for back pain.  Skin: Negative.  Negative for rash.  Neurological: Negative.  Negative for dizziness, focal weakness, weakness and headaches.  Psychiatric/Behavioral: Negative.  The patient is not nervous/anxious.    As per HPI. Otherwise, a complete review of systems is  negative.  PAST MEDICAL HISTORY: Past Medical History:  Diagnosis Date   Diabetes mellitus without complication ([place])    Hyperlipidemia    Neuromuscular disorder ([place])     PAST SURGICAL HISTORY: Past Surgical History:  Procedure Laterality Date   COLONOSCOPY WITH PROPOFOL N/A 11/14/2021   Procedure: COLONOSCOPY WITH PROPOFOL;  Surgeon: Lesly Rubenstein, MD;  Location: [facility] ENDOSCOPY;  Service: Endoscopy;  Laterality: N/A;   ESOPHAGOGASTRODUODENOSCOPY (EGD) WITH PROPOFOL N/A 11/14/2021   Procedure: ESOPHAGOGASTRODUODENOSCOPY (EGD) WITH PROPOFOL;  Surgeon: Lesly Rubenstein, MD;  Location: [facility] ENDOSCOPY;  Service: Endoscopy;  Laterality: N/A;    FAMILY HISTORY: Family History  Problem Relation Age of Onset   Hypertension Mother     ADVANCED DIRECTIVES (Y/N):  N  HEALTH MAINTENANCE: Social History   Tobacco Use   Smoking status: Never   Smokeless tobacco: Never  Vaping Use   Vaping Use: Never used  Substance Use Topics   Alcohol use: Yes    Comment: occassionallh   Drug use: Never     Colonoscopy:  PAP:  Bone density:  Lipid panel:  No Known Allergies  Current Outpatient Medications  Medication Sig Dispense Refill   acyclovir (ZOVIRAX) 400 MG tablet Take 1 tablet (400 mg total) by mouth 5 (five) times daily for 14 days. 70 tablet 0   glipiZIDE (GLUCOTROL XL) 5 MG 24 hr tablet TOME 1 TABLETA POR BOCA DIARIO PARA LA DIABETES 90 tablet 1   metFORMIN (GLUCOPHAGE) 1000 MG tablet Take 1,000 mg by mouth daily.     oxyCODONE (OXY IR/ROXICODONE) 5 MG immediate release tablet Take 5 mg by mouth every 6 (six) hours as needed for pain.     atorvastatin (LIPITOR) 40 MG  tablet TOMA UNA TABLETA POR BOCA AL ACOSTARSE PARA COLESTEROL ALTO 90 tablet 3   HYDROcodone-acetaminophen (NORCO/VICODIN) 5-325 MG tablet Take 1 tablet by mouth every 6 (six) hours as needed for moderate pain. 30 tablet 0   No current facility-administered medications for this visit.     OBJECTIVE: Vitals:   11/23/21 1330  BP: (!) 101/56  Pulse: 79  Temp: 98.6 F (37 C)  SpO2: 100%     Body mass index is 23.74 kg/m.    ECOG FS:1 - Symptomatic but completely ambulatory  General: Well-developed, well-nourished, no acute distress. Eyes: Pink conjunctiva, anicteric sclera. HEENT: Normocephalic, moist mucous membranes. Lungs: No audible wheezing or coughing. Heart: Regular rate and rhythm. Abdomen: Soft, nontender, no obvious distention. Musculoskeletal: No edema, cyanosis, or clubbing. Neuro: Alert, answering all questions appropriately. Cranial nerves grossly intact. Skin: No rashes or petechiae noted. Psych: Normal affect. Lymphatics: No cervical, calvicular, axillary or inguinal LAD.   LAB RESULTS:  Lab Results  Component Value Date   NA 135 11/23/2021   K 4.3 11/23/2021   CL 99 11/23/2021   CO2 25 11/23/2021   GLUCOSE 165 (H) 11/23/2021   BUN 17 11/23/2021   CREATININE 0.77 11/23/2021   CALCIUM 8.6 (L) 11/23/2021   PROT 7.3 11/23/2021   ALBUMIN 3.7 11/23/2021   AST 15 11/23/2021   ALT 13 11/23/2021   ALKPHOS 64 11/23/2021   BILITOT 0.4 11/23/2021   GFRNONAA >60 11/23/2021    Lab Results  Component Value Date   WBC 7.6 11/23/2021   NEUTROABS 5.5 11/23/2021   HGB 8.8 (L) 11/23/2021   HCT 28.3 (L) 11/23/2021   MCV 91.6 11/23/2021   PLT 317 11/23/2021   Lab Results  Component Value Date   IRON 15 (L) 11/11/2021   TIBC 332 11/11/2021   IRONPCTSAT 5 (L) 11/11/2021   Lab Results  Component Value Date   FERRITIN 4 (L) 11/11/2021     STUDIES: DG Chest 2 View  Result Date: 11/11/2021 CLINICAL DATA:  Chest pain.  Sternal pain. EXAM: CHEST - 2 VIEW COMPARISON:  None. FINDINGS: Heart size and mediastinal contours are within normal limits. Lungs are clear. No pleural effusion or pneumothorax is seen. Osseous structures about the chest are unremarkable. IMPRESSION: Negative chest x-ray.  No active cardiopulmonary disease. Electronically  Signed   By: Franki Cabot M.D.   On: 11/11/2021 08:24   CT CHEST ABDOMEN PELVIS W CONTRAST  Result Date: 11/15/2021 CLINICAL DATA:  Esophageal cancer. EXAM: CT CHEST, ABDOMEN, AND PELVIS WITH CONTRAST TECHNIQUE: Multidetector CT imaging of the chest, abdomen and pelvis was performed following the standard protocol during bolus administration of intravenous contrast. CONTRAST:  190mL OMNIPAQUE IOHEXOL 300 MG/ML  SOLN COMPARISON:  None. FINDINGS: CT CHEST FINDINGS Cardiovascular: Coronary artery calcification. Heart is at the upper limits of normal in size. No pericardial effusion. Mediastinum/Nodes: No pathologically enlarged mediastinal, hilar or axillary lymph nodes. No periesophageal lymph nodes. Masslike thickening of the distal esophagus, difficult to measure. Lungs/Pleura: Lungs are clear. No pleural fluid. Airway is unremarkable. Musculoskeletal: No worrisome lytic or sclerotic lesions. CT ABDOMEN PELVIS FINDINGS Hepatobiliary: Liver and gallbladder are unremarkable. No biliary ductal dilatation. Pancreas: Negative. Spleen: Negative. Adrenals/Urinary Tract: Adrenal glands and kidneys are unremarkable. Ureters are decompressed. Bladder is grossly unremarkable. Stomach/Bowel: Masslike thickening at the gastroesophageal junction. Stomach and small bowel, appendix and colon are otherwise unremarkable. Vascular/Lymphatic: Atherosclerotic calcification of the aorta. Upper abdominal lymph nodes are not enlarged by CT size criteria. No pathologically enlarged lymph nodes.  Reproductive: Prostate is enlarged. Other: No free fluid.  Mesenteries and peritoneum are unremarkable. Musculoskeletal: No worrisome lytic or sclerotic lesions. IMPRESSION: 1. Distal esophageal/gastroesophageal junction mass without evidence of metastatic disease. 2. Enlarged prostate. 3. Aortic atherosclerosis (ICD10-I70.0). Coronary artery calcification. Electronically Signed   By: Lorin Picket M.D.   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  Patient will require PET scan as well as EUS to complete the staging work-up.  EUS is scheduled for December 14, 2021.  Return to clinic on December 19, 2021 to discuss his final staging results and treatment planning.  Patient will also have consultation with radiation oncology that day. 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  Patient's hemoglobin has improved to 8.8.  He may benefit from IV iron in the future. Pain: Patient was given a prescription for hydrocodone today. Appetite: Patient reports his weight is stable and he is able to tolerate p.o. intake.  Continue to monitor and if patient's weight begins to decline we will send a referral to dietary.  Patient expressed understanding and was in agreement with this plan. He also understands that He can call clinic at any time with any questions, concerns, or complaints.    Cancer Staging  No matching staging information was found for the patient.  Lloyd Huger, MD   11/23/2021 9:23 PM

## 2021-11-23 NOTE — H&P (View-Only) (Signed)
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  INTERVAL HISTORY: Patient is a 56 year old male who was initially evaluated in the hospital who was admitted with significant weakness and fatigue, dark tarry stools, and chest pain.  He was found to have an hemoglobin of 4.7.  Subsequent EGD revealed esophageal mass consistent with a poorly differentiated squamous cell carcinoma.  Since discharge, he feels improved.  He continues to have mild chest pain.  He has no neurologic complaints.  He denies any recent fevers.  Right and has maintained his weight.  He has no shortness of breath, cough, or hemoptysis.  He denies any nausea, vomiting, constipation, or diarrhea.  He has no further melena or hematochezia.  He has no urinary complaints.  Patient otherwise feels well and offers no further specific complaints today.  REVIEW OF SYSTEMS:   Review of Systems  Constitutional: Negative.  Negative for fever, malaise/fatigue and weight loss.  Respiratory: Negative.  Negative for cough, hemoptysis and shortness of breath.   Cardiovascular:  Positive for chest pain. Negative for leg swelling.  Gastrointestinal: Negative.  Negative for abdominal pain, blood in stool and melena.  Genitourinary: Negative.  Negative for dysuria.  Musculoskeletal: Negative.  Negative for back pain.  Skin: Negative.  Negative for rash.  Neurological: Negative.  Negative for dizziness, focal weakness, weakness and headaches.  Psychiatric/Behavioral: Negative.  The patient is not nervous/anxious.    As per HPI. Otherwise, a complete review of systems is  negative.  PAST MEDICAL HISTORY: Past Medical History:  Diagnosis Date   Diabetes mellitus without complication ([place])    Hyperlipidemia    Neuromuscular disorder ([place])     PAST SURGICAL HISTORY: Past Surgical History:  Procedure Laterality Date   COLONOSCOPY WITH PROPOFOL N/A 11/14/2021   Procedure: COLONOSCOPY WITH PROPOFOL;  Surgeon: Lesly Rubenstein, MD;  Location: [facility] ENDOSCOPY;  Service: Endoscopy;  Laterality: N/A;   ESOPHAGOGASTRODUODENOSCOPY (EGD) WITH PROPOFOL N/A 11/14/2021   Procedure: ESOPHAGOGASTRODUODENOSCOPY (EGD) WITH PROPOFOL;  Surgeon: Lesly Rubenstein, MD;  Location: [facility] ENDOSCOPY;  Service: Endoscopy;  Laterality: N/A;    FAMILY HISTORY: Family History  Problem Relation Age of Onset   Hypertension Mother     ADVANCED DIRECTIVES (Y/N):  N  HEALTH MAINTENANCE: Social History   Tobacco Use   Smoking status: Never   Smokeless tobacco: Never  Vaping Use   Vaping Use: Never used  Substance Use Topics   Alcohol use: Yes    Comment: occassionallh   Drug use: Never     Colonoscopy:  PAP:  Bone density:  Lipid panel:  No Known Allergies  Current Outpatient Medications  Medication Sig Dispense Refill   acyclovir (ZOVIRAX) 400 MG tablet Take 1 tablet (400 mg total) by mouth 5 (five) times daily for 14 days. 70 tablet 0   glipiZIDE (GLUCOTROL XL) 5 MG 24 hr tablet TOME 1 TABLETA POR BOCA DIARIO PARA LA DIABETES 90 tablet 1   metFORMIN (GLUCOPHAGE) 1000 MG tablet Take 1,000 mg by mouth daily.     oxyCODONE (OXY IR/ROXICODONE) 5 MG immediate release tablet Take 5 mg by mouth every 6 (six) hours as needed for pain.     atorvastatin (LIPITOR) 40 MG  tablet TOMA UNA TABLETA POR BOCA AL ACOSTARSE PARA COLESTEROL ALTO 90 tablet 3   HYDROcodone-acetaminophen (NORCO/VICODIN) 5-325 MG tablet Take 1 tablet by mouth every 6 (six) hours as needed for moderate pain. 30 tablet 0   No current facility-administered medications for this visit.     OBJECTIVE: Vitals:   11/23/21 1330  BP: (!) 101/56  Pulse: 79  Temp: 98.6 F (37 C)  SpO2: 100%     Body mass index is 23.74 kg/m.    ECOG FS:1 - Symptomatic but completely ambulatory  General: Well-developed, well-nourished, no acute distress. Eyes: Pink conjunctiva, anicteric sclera. HEENT: Normocephalic, moist mucous membranes. Lungs: No audible wheezing or coughing. Heart: Regular rate and rhythm. Abdomen: Soft, nontender, no obvious distention. Musculoskeletal: No edema, cyanosis, or clubbing. Neuro: Alert, answering all questions appropriately. Cranial nerves grossly intact. Skin: No rashes or petechiae noted. Psych: Normal affect. Lymphatics: No cervical, calvicular, axillary or inguinal LAD.   LAB RESULTS:  Lab Results  Component Value Date   NA 135 11/23/2021   K 4.3 11/23/2021   CL 99 11/23/2021   CO2 25 11/23/2021   GLUCOSE 165 (H) 11/23/2021   BUN 17 11/23/2021   CREATININE 0.77 11/23/2021   CALCIUM 8.6 (L) 11/23/2021   PROT 7.3 11/23/2021   ALBUMIN 3.7 11/23/2021   AST 15 11/23/2021   ALT 13 11/23/2021   ALKPHOS 64 11/23/2021   BILITOT 0.4 11/23/2021   GFRNONAA >60 11/23/2021    Lab Results  Component Value Date   WBC 7.6 11/23/2021   NEUTROABS 5.5 11/23/2021   HGB 8.8 (L) 11/23/2021   HCT 28.3 (L) 11/23/2021   MCV 91.6 11/23/2021   PLT 317 11/23/2021   Lab Results  Component Value Date   IRON 15 (L) 11/11/2021   TIBC 332 11/11/2021   IRONPCTSAT 5 (L) 11/11/2021   Lab Results  Component Value Date   FERRITIN 4 (L) 11/11/2021     STUDIES: DG Chest 2 View  Result Date: 11/11/2021 CLINICAL DATA:  Chest pain.  Sternal pain. EXAM: CHEST - 2 VIEW COMPARISON:  None. FINDINGS: Heart size and mediastinal contours are within normal limits. Lungs are clear. No pleural effusion or pneumothorax is seen. Osseous structures about the chest are unremarkable. IMPRESSION: Negative chest x-ray.  No active cardiopulmonary disease. Electronically  Signed   By: Franki Cabot M.D.   On: 11/11/2021 08:24   CT CHEST ABDOMEN PELVIS W CONTRAST  Result Date: 11/15/2021 CLINICAL DATA:  Esophageal cancer. EXAM: CT CHEST, ABDOMEN, AND PELVIS WITH CONTRAST TECHNIQUE: Multidetector CT imaging of the chest, abdomen and pelvis was performed following the standard protocol during bolus administration of intravenous contrast. CONTRAST:  146mL OMNIPAQUE IOHEXOL 300 MG/ML  SOLN COMPARISON:  None. FINDINGS: CT CHEST FINDINGS Cardiovascular: Coronary artery calcification. Heart is at the upper limits of normal in size. No pericardial effusion. Mediastinum/Nodes: No pathologically enlarged mediastinal, hilar or axillary lymph nodes. No periesophageal lymph nodes. Masslike thickening of the distal esophagus, difficult to measure. Lungs/Pleura: Lungs are clear. No pleural fluid. Airway is unremarkable. Musculoskeletal: No worrisome lytic or sclerotic lesions. CT ABDOMEN PELVIS FINDINGS Hepatobiliary: Liver and gallbladder are unremarkable. No biliary ductal dilatation. Pancreas: Negative. Spleen: Negative. Adrenals/Urinary Tract: Adrenal glands and kidneys are unremarkable. Ureters are decompressed. Bladder is grossly unremarkable. Stomach/Bowel: Masslike thickening at the gastroesophageal junction. Stomach and small bowel, appendix and colon are otherwise unremarkable. Vascular/Lymphatic: Atherosclerotic calcification of the aorta. Upper abdominal lymph nodes are not enlarged by CT size criteria. No pathologically enlarged lymph nodes.  Reproductive: Prostate is enlarged. Other: No free fluid.  Mesenteries and peritoneum are unremarkable. Musculoskeletal: No worrisome lytic or sclerotic lesions. IMPRESSION: 1. Distal esophageal/gastroesophageal junction mass without evidence of metastatic disease. 2. Enlarged prostate. 3. Aortic atherosclerosis (ICD10-I70.0). Coronary artery calcification. Electronically Signed   By: Lorin Picket M.D.   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  Patient will require PET scan as well as EUS to complete the staging work-up.  EUS is scheduled for December 14, 2021.  Return to clinic on December 19, 2021 to discuss his final staging results and treatment planning.  Patient will also have consultation with radiation oncology that day. 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  Patient's hemoglobin has improved to 8.8.  He may benefit from IV iron in the future. Pain: Patient was given a prescription for hydrocodone today. Appetite: Patient reports his weight is stable and he is able to tolerate p.o. intake.  Continue to monitor and if patient's weight begins to decline we will send a referral to dietary.  Patient expressed understanding and was in agreement with this plan. He also understands that He can call clinic at any time with any questions, concerns, or complaints.    Cancer Staging  No matching staging information was found for the patient.  Lloyd Huger, MD   11/23/2021 9:23 PM

## 2021-11-23 NOTE — Progress Notes (Signed)
Pt states he is having pain that starts around the lt flank/chest area and when pain increases, pain goes towards his back. Pain keeps him from sleeping. States hands go numb qhs and his throat hurts when swallowing liquids and solids. Video interpreter used for vitals.

## 2021-11-23 NOTE — Progress Notes (Signed)
Tumor Board Documentation  Rayvion Stumph was presented by Dr Grayland Ormond at our Tumor Board on 11/23/2021, which included representatives from medical oncology, radiation oncology, internal medicine, pathology, radiology, surgical, pharmacy, genetics, research, palliative care, pulmonology.  Prem currently presents as a new patient, for [place], for new positive pathology with history of the following treatments: surgical intervention(s).  Additionally, we reviewed previous medical and familial history, history of present illness, and recent lab results along with all available histopathologic and imaging studies. The tumor board considered available treatment options and made the following recommendations: Additional screening, Surgery, Chemotherapy Will await addtion staging to decide on surgery vs neoadjuvant chemo then surgery  The following procedures/referrals were also placed: No orders of the defined types were placed in this encounter.   Clinical Trial Status: not discussed   Staging used: To be determined AJCC Staging:       Group: Invasive poorly differentiated Squamous cell Carcinoma of Esophagus   National site-specific guidelines NCCN were discussed with respect to the case.  Tumor board is a meeting of clinicians from various specialty areas who evaluate and discuss patients for whom a multidisciplinary approach is being considered. Final determinations in the plan of care are those of the provider(s). The responsibility for follow up of recommendations given during tumor board is that of the provider.   Todays extended care, comprehensive team conference, [place] was not present for the discussion and was not examined.   Multidisciplinary Tumor Board is a multidisciplinary case peer review process.  Decisions discussed in the Multidisciplinary Tumor Board reflect the opinions of the specialists present at the conference without having examined the patient.  Ultimately,  treatment and diagnostic decisions rest with the primary provider(s) and the patient.

## 2021-11-24 ENCOUNTER — Telehealth: Payer: Self-pay

## 2021-11-24 ENCOUNTER — Other Ambulatory Visit: Payer: Self-pay | Admitting: *Deleted

## 2021-11-24 DIAGNOSIS — C159 Malignant neoplasm of esophagus, unspecified: Secondary | ICD-10-CM

## 2021-11-24 MED ORDER — HYDROCODONE-ACETAMINOPHEN 5-325 MG PO TABS: 1.0000 | ORAL_TABLET | Freq: Four times a day (QID) | ORAL | 0 refills | Status: DC | PRN

## 2021-11-24 NOTE — Telephone Encounter (Signed)
Spoke to the pharmacy and they confirmed receipt of Hydrocodone prescription. The person I spoke to does not work for that specific pharmacy to which prescription was sent ([facility]), but is able to take calls for them. She said that [place] is closed and pt will not be able to pick up until Monday. Medication is still in process and has not been picked up yet. IF pt needs medication for the weekend, it would need to be sent to another pharmacy and we would need to call them [facility]) back to cancel rx. . I have tried calling pt multiple times and no answer.   Dr. Grayland Ormond aware.

## 2021-11-27 ENCOUNTER — Telehealth: Payer: Self-pay

## 2021-11-27 NOTE — Telephone Encounter (Signed)
PET and EUS instructions have been translated into spanish and mailed to home address.

## 2021-12-05 ENCOUNTER — Other Ambulatory Visit: Payer: Self-pay

## 2021-12-06 ENCOUNTER — Encounter: Admission: RE | Admit: 2021-12-06 | Payer: Self-pay | Source: Ambulatory Visit

## 2021-12-06 ENCOUNTER — Telehealth: Payer: Self-pay

## 2021-12-06 NOTE — Telephone Encounter (Signed)
Attempted to contact patient at 2 numbers and no answer. Called and spoke to wife to let her know that PET scan was r/s and that it is very important that he attends this appt. Went over NPO instructions for PET. Also informed her that he needs to keep his other appts (EUS, f/u with Dr. Woodfin Ganja and Dr. Baruch Gouty). She voiced understanding and said she would inform pt. Appointment reminder mail.

## 2021-12-12 ENCOUNTER — Telehealth: Payer: Self-pay | Admitting: Oncology

## 2021-12-12 ENCOUNTER — Other Ambulatory Visit: Payer: Self-pay | Admitting: *Deleted

## 2021-12-12 DIAGNOSIS — C159 Malignant neoplasm of esophagus, unspecified: Secondary | ICD-10-CM

## 2021-12-12 MED ORDER — HYDROCODONE-ACETAMINOPHEN 5-325 MG PO TABS: 1.0000 | ORAL_TABLET | Freq: Four times a day (QID) | ORAL | 0 refills | Status: DC | PRN

## 2021-12-12 NOTE — Telephone Encounter (Signed)
Pt left VM with the referral coordinator that he needs help understanding the PET scan instructions. Pt has been contacted on two other occasions with instructions along with his wife. Benjamine Mola gave  PET details to pts wife on 12/30 and Mariea Clonts also mailed a Spanish Instruction sheet along with communicating the details with a interpreter present on 12/15. Benjamine Mola has tried to reach the patient multiple times this afternoon with no answer at any of the numbers listed. Melissa also tried to reach the patient with a interpreter and not successful.

## 2021-12-13 ENCOUNTER — Other Ambulatory Visit: Payer: Self-pay | Admitting: *Deleted

## 2021-12-13 ENCOUNTER — Encounter
Admission: RE | Admit: 2021-12-13 | Discharge: 2021-12-13 | Disposition: A | Discharge status: Home / Self Care | Payer: Self-pay | Source: Ambulatory Visit | Attending: Oncology | Admitting: Oncology

## 2021-12-13 DIAGNOSIS — D508 Other iron deficiency anemias: Secondary | ICD-10-CM

## 2021-12-13 DIAGNOSIS — C159 Malignant neoplasm of esophagus, unspecified: Secondary | ICD-10-CM

## 2021-12-13 LAB — GLUCOSE, CAPILLARY: Glucose-Capillary: 124 mg/dL — ABNORMAL HIGH (ref 70–99)

## 2021-12-13 MED ORDER — FLUDEOXYGLUCOSE F - 18 (FDG) INJECTION
7.6000 | Freq: Once | INTRAVENOUS | Status: AC | PRN
  Administered 2021-12-13: 7.7 via INTRAVENOUS

## 2021-12-14 ENCOUNTER — Ambulatory Visit: Payer: Self-pay | Admitting: Anesthesiology

## 2021-12-14 ENCOUNTER — Encounter
Admission: RE | Disposition: A | Discharge status: Home / Self Care | Payer: Self-pay | Source: Ambulatory Visit | Attending: Internal Medicine

## 2021-12-14 ENCOUNTER — Ambulatory Visit
Admission: RE | Admit: 2021-12-14 | Discharge: 2021-12-14 | Disposition: A | Discharge status: Home / Self Care | Payer: Self-pay | Source: Ambulatory Visit | Attending: Internal Medicine | Admitting: Internal Medicine

## 2021-12-14 ENCOUNTER — Encounter: Payer: Self-pay | Admitting: Internal Medicine

## 2021-12-14 DIAGNOSIS — C160 Malignant neoplasm of cardia: Secondary | ICD-10-CM | POA: Insufficient documentation

## 2021-12-14 DIAGNOSIS — E119 Type 2 diabetes mellitus without complications: Secondary | ICD-10-CM | POA: Insufficient documentation

## 2021-12-14 DIAGNOSIS — Z7984 Long term (current) use of oral hypoglycemic drugs: Secondary | ICD-10-CM | POA: Insufficient documentation

## 2021-12-14 DIAGNOSIS — E785 Hyperlipidemia, unspecified: Secondary | ICD-10-CM | POA: Insufficient documentation

## 2021-12-14 HISTORY — DX: Malignant (primary) neoplasm, unspecified: C80.1

## 2021-12-14 HISTORY — PX: EUS: SHX5427

## 2021-12-14 LAB — GLUCOSE, CAPILLARY: Glucose-Capillary: 126 mg/dL — ABNORMAL HIGH (ref 70–99)

## 2021-12-14 SURGERY — ULTRASOUND, UPPER GI TRACT, ENDOSCOPIC
Anesthesia: General

## 2021-12-14 MED ORDER — LIDOCAINE HCL (CARDIAC) PF 100 MG/5ML IV SOSY
PREFILLED_SYRINGE | INTRAVENOUS | Status: DC | PRN
  Administered 2021-12-14: 50 mg via INTRAVENOUS

## 2021-12-14 MED ORDER — PROPOFOL 500 MG/50ML IV EMUL
INTRAVENOUS | Status: DC | PRN
  Administered 2021-12-14: 120 ug/kg/min via INTRAVENOUS

## 2021-12-14 MED ORDER — PROPOFOL 500 MG/50ML IV EMUL
INTRAVENOUS | Status: AC
  Filled 2021-12-14: qty 50

## 2021-12-14 MED ORDER — SODIUM CHLORIDE 0.9 % IV SOLN: INTRAVENOUS | Status: DC

## 2021-12-14 MED ORDER — LIDOCAINE HCL (PF) 2 % IJ SOLN
INTRAMUSCULAR | Status: AC
  Filled 2021-12-14: qty 5

## 2021-12-14 NOTE — Interval H&P Note (Signed)
History and Physical Interval Note:  12/14/2021 1:41 PM  Samuel Pennington  has presented today for surgery, with the diagnosis of esophageal adenocarcinoma.  The various methods of treatment have been discussed with the patient and family. After consideration of risks, benefits and other options for treatment, the patient has consented to  Procedure(s) with comments: FULL UPPER ENDOSCOPIC ULTRASOUND (EUS) RADIAL (N/A) - SPANISH INTERPRETER as a surgical intervention.  The patient's history has been reviewed, patient examined, no change in status, stable for surgery.  I have reviewed the patient's chart and labs.  Questions were answered to the patient's satisfaction.     Tillie Rung

## 2021-12-14 NOTE — Anesthesia Preprocedure Evaluation (Signed)
Anesthesia Evaluation  Patient identified by MRN, date of birth, ID band Patient awake    Reviewed: Allergy & Precautions, NPO status , Patient's Chart, lab work & pertinent test results  History of Anesthesia Complications Negative for: history of anesthetic complications  Airway Mallampati: II  TM Distance: >3 FB Neck ROM: Full    Dental no notable dental hx. (+) Teeth Intact   Pulmonary neg pulmonary ROS, neg sleep apnea, neg COPD, Patient abstained from smoking.Not current smoker,    Pulmonary exam normal breath sounds clear to auscultation       Cardiovascular Exercise Tolerance: Good METS(-) hypertension(-) CAD and (-) Past MI (-) dysrhythmias  Rhythm:Regular Rate:Normal - Systolic murmurs    Neuro/Psych negative neurological ROS  negative psych ROS   GI/Hepatic neg GERD  ,(+)     (-) substance abuse  , Esophageal cancer   Endo/Other  diabetes, Oral Hypoglycemic Agents  Renal/GU negative Renal ROS     Musculoskeletal   Abdominal   Peds  Hematology  (+) anemia ,   Anesthesia Other Findings Past Medical History: No date: Cancer ([place]) No date: Diabetes mellitus without complication (HCC) No date: Hyperlipidemia No date: Neuromuscular disorder ([place])  Reproductive/Obstetrics                             Anesthesia Physical Anesthesia Plan  ASA: 3  Anesthesia Plan: General   Post-op Pain Management: Minimal or no pain anticipated   Induction: Intravenous  PONV Risk Score and Plan: 2 and Propofol infusion, TIVA and Ondansetron  Airway Management Planned: Nasal Cannula  Additional Equipment: None  Intra-op Plan:   Post-operative Plan:   Informed Consent: I have reviewed the patients History and Physical, chart, labs and discussed the procedure including the risks, benefits and alternatives for the proposed anesthesia with the patient or authorized representative who has  indicated his/her understanding and acceptance.     Dental advisory given  Plan Discussed with: CRNA and Surgeon  Anesthesia Plan Comments: (Discussed risks of anesthesia with patient, including possibility of difficulty with spontaneous ventilation under anesthesia necessitating airway intervention, PONV, and rare risks such as cardiac or respiratory or neurological events, and allergic reactions. Discussed the role of CRNA in patient's perioperative care. Patient understands.)        Anesthesia Quick Evaluation

## 2021-12-14 NOTE — Anesthesia Postprocedure Evaluation (Signed)
Anesthesia Post Note  Patient: Samuel Pennington  Procedure(s) Performed: FULL UPPER ENDOSCOPIC ULTRASOUND (EUS) RADIAL  Patient location during evaluation: Endoscopy Anesthesia Type: General Level of consciousness: awake and alert Pain management: pain level controlled Vital Signs Assessment: post-procedure vital signs reviewed and stable Respiratory status: spontaneous breathing, nonlabored ventilation, respiratory function stable and patient connected to nasal cannula oxygen Cardiovascular status: blood pressure returned to baseline and stable Postop Assessment: no apparent nausea or vomiting Anesthetic complications: no   No notable events documented.   Last Vitals:  Vitals:   12/14/21 1308 12/14/21 1404  BP: 131/84 (!) 85/58  Pulse: 85 78  Resp: 20 (!) 28  Temp: (!) 36.3 C 36.5 C  SpO2: 100% 96%    Last Pain:  Vitals:   12/14/21 1404  TempSrc: Temporal  PainSc: 0-No pain                 Arita Miss

## 2021-12-14 NOTE — Discharge Instructions (Signed)
Discharge to home °

## 2021-12-14 NOTE — Anesthesia Procedure Notes (Signed)
Date/Time: 12/14/2021 1:47 PM Performed by: Vaughan Sine Pre-anesthesia Checklist: Patient identified, Emergency Drugs available, Suction available, Patient being monitored and Timeout performed Patient Re-evaluated:Patient Re-evaluated prior to induction Oxygen Delivery Method: Nasal cannula Preoxygenation: Pre-oxygenation with 100% oxygen Induction Type: IV induction Airway Equipment and Method: Bite block Placement Confirmation: positive ETCO2 and CO2 detector

## 2021-12-14 NOTE — Op Note (Signed)
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 Providers:             Murray Hodgkins. Kermit Referring MD:          Dyke Maes. Mancheno Revelo (Referring MD), Kathlene November.                         Grayland Ormond, MD (Referring MD) Medicines:             Propofol per Anesthesia Complications:         No immediate complications. Procedure:             Pre-Anesthesia Assessment:                        Prior to the procedure, a History and Physical was                         performed, and patient medications and allergies were                         reviewed. The patient is competent. The risks and                         benefits of the procedure and the sedation options and                         risks were discussed with the patient. All questions                         were answered and informed consent was obtained.                         Patient identification and proposed procedure were                         verified by the physician, the nurse and the                         anesthetist in the pre-procedure area. Mental Status                         Examination: alert and oriented. Airway Examination:                         normal oropharyngeal airway and neck mobility.  Respiratory Examination: clear to auscultation. CV                         Examination: normal. Prophylactic Antibiotics: The                         patient  does not require prophylactic antibiotics.                         Prior Anticoagulants: The patient has taken no                         previous anticoagulant or antiplatelet agents. ASA                         Grade Assessment: II - A patient with mild systemic                         disease. After reviewing the risks and benefits, the                         patient was deemed in satisfactory condition to                         undergo the procedure. The anesthesia plan was to use                         monitored anesthesia care (MAC). Immediately prior to                         administration of medications, the patient was                         re-assessed for adequacy to receive sedatives. The                         heart rate, respiratory rate, oxygen saturations,                         blood pressure, adequacy of pulmonary ventilation, and                         response to care were monitored throughout the                         procedure. The physical status of the patient was                         re-assessed after the procedure.                        After obtaining informed consent, the endoscope was                         passed under direct vision. Throughout the procedure,                         the patient's blood pressure, pulse, and oxygen  saturations were monitored continuously. The Endoscope                         was introduced through the mouth, and advanced to the                         second part of duodenum. The Endoscope was introduced                         through the mouth, and advanced to the stomach for                         ultrasound examination from the esophagus and stomach.                         The upper EUS was accomplished without difficulty. The                         patient tolerated the procedure well. Findings:      ENDOSCOPIC FINDING: :      A large, ulcerating mass was found at the  gastroesophageal       junction/gastric cardia, 39 to 43 cm from the incisors. The mass was       partially obstructing and partially circumferential (involving two       thirds of the lumen circumference).      Hematin was found in the stomach. The exam of the esophagus and stomach       was otherwise normal.      The examined duodenum was endoscopically normal.      ENDOSONOGRAPHIC FINDING: :      A hypoechoic oval mass was identified endosonographically in the       GEJ/cardia of the stomach. The mass measured 37 mm by 30 mm in maximal       cross-sectional diameter. The endosonographic borders were well-defined.       There was sonographic evidence suggesting invasion into the serosa       (Layer 5).      One benign-appearing lymph node was visualized in the subcarinal       mediastinum (level 7). It measured 8.1 mm by 4.7 mm in maximal       cross-sectional diameter. The node was oval, isoechoic and had well       defined margins.      Endosonographic imaging in the left lobe of the liver showed no       abnormalities.      The celiac region was visualized and showed no sign of significant       endosonographic abnormality. Impression:            EGD Impressions:                        - Partially obstructing, malignant esophageal tumor                         was found at the gastroesophageal junction/cardia.                         Consistent with known SCC.                        -  Otherwise normal examined esophagus and stomach.                        - Normal examined duodenum.                        EUS Impressions:                        - A mass was found in the GEJ/cardia of the stomach. A                         tissue diagnosis was obtained prior to this exam. This                         is consistent with squamous cell carcinoma. This was                         staged T3 N0 Mx by endosonographic criteria.                        - One benign lymph node was visualized  in the                         subcarinal mediastinum (level 7). Tissue has not been                         obtained. However, the endosonographic appearance is                         suggestive of benign inflammatory changes.                        - Normal visualized portions of the liver.                        - Normal celiac region.                        - No specimens collected. Recommendation:        - Discharge patient to home (ambulatory).                        - The findings and recommendations were discussed with                         the patient.                        - Return to referring physician as previously                         scheduled. Further plan of care to be determined by                         the referring physicians. Procedure Code(s):     --- Professional ---                        [phone], Esophagogastroduodenoscopy, flexible,  transoral; with endoscopic ultrasound examination                         limited to the esophagus, stomach or duodenum, and                         adjacent structures Diagnosis Code(s):     --- Professional ---                        D49.0, Neoplasm of unspecified behavior of digestive                         system                        K31.89, Other diseases of stomach and duodenum                        C16.0, Malignant neoplasm of cardia                        I89.9, Noninfective disorder of lymphatic vessels and                         lymph nodes, unspecified CPT copyright 2019 American Medical Association. All rights reserved. The codes documented in this report are preliminary and upon coder review may  be revised to meet current compliance requirements. Attending Participation:      I personally performed the entire procedure without the assistance of a       fellow, resident or surgical assistant. [place],  12/14/2021 2:12:02 PM This report has been signed  electronically. Number of Addenda: 0 Note Initiated On: 12/14/2021 1:39 PM Estimated Blood Loss:  Estimated blood loss: none.      [facility]

## 2021-12-14 NOTE — Transfer of Care (Signed)
Immediate Anesthesia Transfer of Care Note  Patient: Samuel Pennington  Procedure(s) Performed: FULL UPPER ENDOSCOPIC ULTRASOUND (EUS) RADIAL  Patient Location: PACU  Anesthesia Type:General  Level of Consciousness: awake and sedated  Airway & Oxygen Therapy: Patient Spontanous Breathing and Patient connected to nasal cannula oxygen  Post-op Assessment: Report given to RN and Post -op Vital signs reviewed and stable  Post vital signs: Reviewed and stable  Last Vitals:  Vitals Value Taken Time  BP    Temp    Pulse    Resp    SpO2      Last Pain:  Vitals:   12/14/21 1308  TempSrc: Temporal  PainSc: 4       Patients Stated Pain Goal: 0 (20/25/42 7062)  Complications: No notable events documented.

## 2021-12-15 ENCOUNTER — Encounter: Payer: Self-pay | Admitting: Internal Medicine

## 2021-12-17 NOTE — Progress Notes (Deleted)
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  INTERVAL HISTORY: Patient is a 56 year old male who was initially evaluated in the hospital who was admitted with significant weakness and fatigue, dark tarry stools, and chest pain.  He was found to have an hemoglobin of 4.7.  Subsequent EGD revealed esophageal mass consistent with a poorly differentiated squamous cell carcinoma.  Since discharge, he feels improved.  He continues to have mild chest pain.  He has no neurologic complaints.  He denies any recent fevers.  Right and has maintained his weight.  He has no shortness of breath, cough, or hemoptysis.  He denies any nausea, vomiting, constipation, or diarrhea.  He has no further melena or hematochezia.  He has no urinary complaints.  Patient otherwise feels well and offers no further specific complaints today.  REVIEW OF SYSTEMS:   Review of Systems  Constitutional: Negative.  Negative for fever, malaise/fatigue and weight loss.  Respiratory: Negative.  Negative for cough, hemoptysis and shortness of breath.   Cardiovascular:  Positive for chest pain. Negative for leg swelling.  Gastrointestinal: Negative.  Negative for abdominal pain, blood in stool and melena.  Genitourinary: Negative.  Negative for dysuria.  Musculoskeletal: Negative.  Negative for back pain.  Skin: Negative.  Negative for rash.  Neurological: Negative.  Negative for dizziness, focal weakness, weakness and headaches.  Psychiatric/Behavioral: Negative.  The patient is not nervous/anxious.    As per HPI. Otherwise, a complete review of systems is  negative.  PAST MEDICAL HISTORY: Past Medical History:  Diagnosis Date   Cancer ([place])    Diabetes mellitus without complication ([place])    Hyperlipidemia    Neuromuscular disorder ([place])     PAST SURGICAL HISTORY: Past Surgical History:  Procedure Laterality Date   COLONOSCOPY WITH PROPOFOL N/A 11/14/2021   Procedure: COLONOSCOPY WITH PROPOFOL;  Surgeon: Lesly Rubenstein, MD;  Location: [facility] ENDOSCOPY;  Service: Endoscopy;  Laterality: N/A;   ESOPHAGOGASTRODUODENOSCOPY (EGD) WITH PROPOFOL N/A 11/14/2021   Procedure: ESOPHAGOGASTRODUODENOSCOPY (EGD) WITH PROPOFOL;  Surgeon: Lesly Rubenstein, MD;  Location: [facility] ENDOSCOPY;  Service: Endoscopy;  Laterality: N/A;   EUS N/A 12/14/2021   Procedure: FULL UPPER ENDOSCOPIC ULTRASOUND (EUS) RADIAL;  Surgeon: Holly Bodily, MD;  Location: [facility] ENDOSCOPY;  Service: Gastroenterology;  Laterality: N/A;  SPANISH INTERPRETER    FAMILY HISTORY: Family History  Problem Relation Age of Onset   Hypertension Mother     ADVANCED DIRECTIVES (Y/N):  N  HEALTH MAINTENANCE: Social History   Tobacco Use   Smoking status: Never   Smokeless tobacco: Never  Vaping Use   Vaping Use: Never used  Substance Use Topics   Alcohol use: Yes    Comment: occassionallh   Drug use: Never     Colonoscopy:  PAP:  Bone density:  Lipid panel:  No Known Allergies  Current Outpatient Medications  Medication Sig Dispense Refill   atorvastatin (LIPITOR) 40 MG tablet TOMA UNA TABLETA POR BOCA AL ACOSTARSE PARA COLESTEROL ALTO 90 tablet 3   glipiZIDE (GLUCOTROL XL) 5 MG 24 hr tablet TOME 1 TABLETA POR BOCA DIARIO PARA LA DIABETES 90 tablet 1   HYDROcodone-acetaminophen (NORCO/VICODIN) 5-325 MG tablet Take 1 tablet by mouth every  6 (six) hours as needed for moderate pain. 30 tablet 0   metFORMIN (GLUCOPHAGE) 1000 MG tablet Take 1,000 mg by mouth daily.     oxyCODONE (OXY IR/ROXICODONE) 5 MG immediate release tablet Take 5 mg by mouth every 6 (six) hours as  needed for pain.     No current facility-administered medications for this visit.    OBJECTIVE: There were no vitals filed for this visit.    There is no height or weight on file to calculate BMI.    ECOG FS:1 - Symptomatic but completely ambulatory  General: Well-developed, well-nourished, no acute distress. Eyes: Pink conjunctiva, anicteric sclera. HEENT: Normocephalic, moist mucous membranes. Lungs: No audible wheezing or coughing. Heart: Regular rate and rhythm. Abdomen: Soft, nontender, no obvious distention. Musculoskeletal: No edema, cyanosis, or clubbing. Neuro: Alert, answering all questions appropriately. Cranial nerves grossly intact. Skin: No rashes or petechiae noted. Psych: Normal affect. Lymphatics: No cervical, calvicular, axillary or inguinal LAD.   LAB RESULTS:  Lab Results  Component Value Date   NA 135 11/23/2021   K 4.3 11/23/2021   CL 99 11/23/2021   CO2 25 11/23/2021   GLUCOSE 165 (H) 11/23/2021   BUN 17 11/23/2021   CREATININE 0.77 11/23/2021   CALCIUM 8.6 (L) 11/23/2021   PROT 7.3 11/23/2021   ALBUMIN 3.7 11/23/2021   AST 15 11/23/2021   ALT 13 11/23/2021   ALKPHOS 64 11/23/2021   BILITOT 0.4 11/23/2021   GFRNONAA >60 11/23/2021    Lab Results  Component Value Date   WBC 7.6 11/23/2021   NEUTROABS 5.5 11/23/2021   HGB 8.8 (L) 11/23/2021   HCT 28.3 (L) 11/23/2021   MCV 91.6 11/23/2021   PLT 317 11/23/2021   Lab Results  Component Value Date   IRON 15 (L) 11/11/2021   TIBC 332 11/11/2021   IRONPCTSAT 5 (L) 11/11/2021   Lab Results  Component Value Date   FERRITIN 4 (L) 11/11/2021     STUDIES: NM PET Image Initial (PI) Skull Base To Thigh  Result Date: 12/14/2021 CLINICAL DATA:  Initial treatment strategy for staging of esophageal adenocarcinoma. EXAM: NUCLEAR MEDICINE PET SKULL BASE TO THIGH TECHNIQUE: 7.7 mCi F-18 FDG was injected intravenously. Full-ring PET imaging was performed from the skull base to thigh after the  radiotracer. CT data was obtained and used for attenuation correction and anatomic localization. Fasting blood glucose: 124 mg/dl COMPARISON:  Chest abdomen and pelvic CTs of 11/15/2021. FINDINGS: Mediastinal blood pool activity: SUV max 1.9 Liver activity: SUV max NA NECK: No areas of abnormal hypermetabolism. Incidental CT findings: No cervical adenopathy. CHEST: No pulmonary parenchymal or thoracic nodal hypermetabolism. Incidental CT findings: Deferred to recent diagnostic CT. Aortic and coronary artery calcification. Left hemidiaphragm elevation. Esophageal fluid level may represent a component of obstruction secondary to the below described mass. ABDOMEN/PELVIS: Hypermetabolism within the gastric cardia consistent with the site of primary. Example at 5.1 x 3.4 cm and a S.U.V. max of 13.1 on 127/3. No abdominopelvic nodal hypermetabolism. Incidental CT findings: Deferred to recent diagnostic CT. Abdominal aortic atherosclerosis. Mild prostatomegaly. SKELETON: No abnormal marrow activity. Incidental CT findings: none IMPRESSION: 1. Hypermetabolic GE junction mass, currently centered in the gastric cardia. 2. No evidence of hypermetabolic nodal or distant metastasis. 3. Incidental findings, including: Coronary artery atherosclerosis. Aortic Atherosclerosis (ICD10-I70.0). Prostatomegaly. Electronically Signed   By: Abigail Miyamoto M.D.   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  PLAN:    Poorly differentiated squamous cell  of the esophagus: Pathology and imaging reviewed independently confirming diagnosis.  Patient will require PET scan as well as EUS to complete the staging work-up.  EUS is scheduled for December 14, 2021.  Return to clinic on December 19, 2021 to discuss his final staging results and treatment planning.  Patient will also have consultation with radiation oncology that day. 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  Patient's hemoglobin has improved  to 8.8.  He may benefit from IV iron in the future. Pain: Patient was given a prescription for hydrocodone today. Appetite: Patient reports his weight is stable and he is able to tolerate p.o. intake.  Continue to monitor and if patient's weight begins to decline we will send a referral to dietary.  Patient expressed understanding and was in agreement with this plan. He also understands that He can call clinic at any time with any questions, concerns, or complaints.    Cancer Staging  No matching staging information was found for the patient.  Lloyd Huger, MD   12/17/2021 8:14 AM

## 2021-12-18 ENCOUNTER — Observation Stay: Payer: Self-pay

## 2021-12-18 ENCOUNTER — Other Ambulatory Visit: Payer: Self-pay

## 2021-12-18 ENCOUNTER — Inpatient Hospital Stay
Admission: EM | Admit: 2021-12-18 | Discharge: 2021-12-20 | DRG: 375 | Disposition: A | Discharge status: Home / Self Care | Payer: Self-pay | Attending: Family Medicine | Admitting: Family Medicine

## 2021-12-18 DIAGNOSIS — K649 Unspecified hemorrhoids: Secondary | ICD-10-CM | POA: Diagnosis present

## 2021-12-18 DIAGNOSIS — K922 Gastrointestinal hemorrhage, unspecified: Secondary | ICD-10-CM | POA: Diagnosis present

## 2021-12-18 DIAGNOSIS — E119 Type 2 diabetes mellitus without complications: Secondary | ICD-10-CM

## 2021-12-18 DIAGNOSIS — R0602 Shortness of breath: Secondary | ICD-10-CM

## 2021-12-18 DIAGNOSIS — Z87891 Personal history of nicotine dependence: Secondary | ICD-10-CM

## 2021-12-18 DIAGNOSIS — C159 Malignant neoplasm of esophagus, unspecified: Principal | ICD-10-CM | POA: Diagnosis present

## 2021-12-18 DIAGNOSIS — Z79899 Other long term (current) drug therapy: Secondary | ICD-10-CM

## 2021-12-18 DIAGNOSIS — D509 Iron deficiency anemia, unspecified: Secondary | ICD-10-CM | POA: Diagnosis present

## 2021-12-18 DIAGNOSIS — E785 Hyperlipidemia, unspecified: Secondary | ICD-10-CM | POA: Diagnosis present

## 2021-12-18 DIAGNOSIS — Z8249 Family history of ischemic heart disease and other diseases of the circulatory system: Secondary | ICD-10-CM

## 2021-12-18 DIAGNOSIS — D649 Anemia, unspecified: Secondary | ICD-10-CM | POA: Diagnosis present

## 2021-12-18 DIAGNOSIS — K921 Melena: Secondary | ICD-10-CM | POA: Diagnosis present

## 2021-12-18 DIAGNOSIS — K2289 Other specified disease of esophagus: Secondary | ICD-10-CM | POA: Diagnosis present

## 2021-12-18 DIAGNOSIS — Z79891 Long term (current) use of opiate analgesic: Secondary | ICD-10-CM

## 2021-12-18 DIAGNOSIS — I10 Essential (primary) hypertension: Secondary | ICD-10-CM | POA: Diagnosis present

## 2021-12-18 DIAGNOSIS — G894 Chronic pain syndrome: Secondary | ICD-10-CM | POA: Diagnosis present

## 2021-12-18 DIAGNOSIS — I2510 Atherosclerotic heart disease of native coronary artery without angina pectoris: Secondary | ICD-10-CM | POA: Diagnosis present

## 2021-12-18 DIAGNOSIS — D62 Acute posthemorrhagic anemia: Secondary | ICD-10-CM | POA: Diagnosis present

## 2021-12-18 DIAGNOSIS — Z20822 Contact with and (suspected) exposure to covid-19: Secondary | ICD-10-CM | POA: Diagnosis present

## 2021-12-18 DIAGNOSIS — Z7984 Long term (current) use of oral hypoglycemic drugs: Secondary | ICD-10-CM

## 2021-12-18 LAB — COMPREHENSIVE METABOLIC PANEL
ALT: 11 U/L (ref 0–44)
AST: 19 U/L (ref 15–41)
Albumin: 3.5 g/dL (ref 3.5–5.0)
Alkaline Phosphatase: 57 U/L (ref 38–126)
Anion gap: 9 (ref 5–15)
BUN: 15 mg/dL (ref 6–20)
CO2: 26 mmol/L (ref 22–32)
Calcium: 8.4 mg/dL — ABNORMAL LOW (ref 8.9–10.3)
Chloride: 102 mmol/L (ref 98–111)
Creatinine, Ser: 0.83 mg/dL (ref 0.61–1.24)
GFR, Estimated: >60 mL/min (ref >60)
Glucose, Bld: 188 mg/dL — ABNORMAL HIGH (ref 70–99)
Potassium: 4.2 mmol/L (ref 3.5–5.1)
Sodium: 137 mmol/L (ref 135–145)
Total Bilirubin: 0.5 mg/dL (ref 0.3–1.2)
Total Protein: 6.8 g/dL (ref 6.5–8.1)

## 2021-12-18 LAB — HEMOGLOBIN AND HEMATOCRIT, BLOOD
HCT: 20.9 % — ABNORMAL LOW (ref 39.0–52.0)
HCT: 22.4 % — ABNORMAL LOW (ref 39.0–52.0)
Hemoglobin: 6.7 g/dL — ABNORMAL LOW (ref 13.0–17.0)
Hemoglobin: 7.5 g/dL — ABNORMAL LOW (ref 13.0–17.0)

## 2021-12-18 LAB — CBC
HCT: 18.8 % — ABNORMAL LOW (ref 39.0–52.0)
Hemoglobin: 5.6 g/dL — ABNORMAL LOW (ref 13.0–17.0)
MCH: 27.1 pg (ref 26.0–34.0)
MCHC: 29.8 g/dL — ABNORMAL LOW (ref 30.0–36.0)
MCV: 90.8 fL (ref 80.0–100.0)
Platelets: 328 10*3/uL (ref 150–400)
RBC: 2.07 MIL/uL — ABNORMAL LOW (ref 4.22–5.81)
RDW: 16.3 % — ABNORMAL HIGH (ref 11.5–15.5)
WBC: 7.3 10*3/uL (ref 4.0–10.5)
nRBC: 0 % (ref 0.0–0.2)

## 2021-12-18 LAB — PREPARE RBC (CROSSMATCH)

## 2021-12-18 LAB — RESP PANEL BY RT-PCR (FLU A&B, COVID) ARPGX2
Influenza A by PCR: NEGATIVE
Influenza B by PCR: NEGATIVE
SARS Coronavirus 2 by RT PCR: NEGATIVE

## 2021-12-18 LAB — CBG MONITORING, ED
Glucose-Capillary: 148 mg/dL — ABNORMAL HIGH (ref 70–99)
Glucose-Capillary: 161 mg/dL — ABNORMAL HIGH (ref 70–99)

## 2021-12-18 MED ORDER — INSULIN ASPART 100 UNIT/ML IJ SOLN
0.0000 [IU] | Freq: Three times a day (TID) | INTRAMUSCULAR | Status: DC
  Administered 2021-12-18: 2 [IU] via SUBCUTANEOUS
  Administered 2021-12-19: 3 [IU] via SUBCUTANEOUS
  Administered 2021-12-20 (×2): 2 [IU] via SUBCUTANEOUS
  Filled 2021-12-18 (×5): qty 1

## 2021-12-18 MED ORDER — MORPHINE SULFATE (PF) 2 MG/ML IV SOLN: 1.0000 mg | INTRAVENOUS | Status: DC | PRN

## 2021-12-18 MED ORDER — ONDANSETRON HCL 4 MG PO TABS: 4.0000 mg | ORAL_TABLET | Freq: Four times a day (QID) | ORAL | Status: DC | PRN

## 2021-12-18 MED ORDER — ACETAMINOPHEN 650 MG RE SUPP: 650.0000 mg | Freq: Four times a day (QID) | RECTAL | Status: DC | PRN

## 2021-12-18 MED ORDER — INSULIN ASPART 100 UNIT/ML IJ SOLN: 0.0000 [IU] | Freq: Every day | INTRAMUSCULAR | Status: DC

## 2021-12-18 MED ORDER — ACETAMINOPHEN 500 MG PO TABS: 1000.0000 mg | ORAL_TABLET | Freq: Four times a day (QID) | ORAL | Status: DC | PRN

## 2021-12-18 MED ORDER — ATORVASTATIN CALCIUM 20 MG PO TABS
40.0000 mg | ORAL_TABLET | Freq: Every day | ORAL | Status: DC
Start: 2021-12-19 — End: 2021-12-20
  Administered 2021-12-19: 40 mg via ORAL
  Filled 2021-12-18: qty 2

## 2021-12-18 MED ORDER — OXYCODONE HCL 5 MG PO TABS
10.0000 mg | ORAL_TABLET | Freq: Four times a day (QID) | ORAL | Status: DC | PRN
Start: 2021-12-18 — End: 2021-12-20
  Administered 2021-12-18 – 2021-12-20 (×5): 10 mg via ORAL
  Filled 2021-12-18 (×5): qty 2

## 2021-12-18 MED ORDER — SODIUM CHLORIDE 0.9 % IV SOLN
10.0000 mL/h | Freq: Once | INTRAVENOUS | Status: AC
  Administered 2021-12-18: 10 mL/h via INTRAVENOUS

## 2021-12-18 MED ORDER — SUCRALFATE 1 GM/10ML PO SUSP
1.0000 g | Freq: Three times a day (TID) | ORAL | Status: DC
  Administered 2021-12-18 – 2021-12-19 (×2): 1 g via ORAL
  Filled 2021-12-18 (×4): qty 10

## 2021-12-18 MED ORDER — PANTOPRAZOLE SODIUM 40 MG PO TBEC: 40.0000 mg | DELAYED_RELEASE_TABLET | Freq: Two times a day (BID) | ORAL | Status: DC

## 2021-12-18 MED ORDER — ONDANSETRON HCL 4 MG/2ML IJ SOLN: 4.0000 mg | Freq: Four times a day (QID) | INTRAMUSCULAR | Status: DC | PRN

## 2021-12-18 MED ORDER — LACTATED RINGERS IV BOLUS
500.0000 mL | Freq: Once | INTRAVENOUS | Status: AC
  Administered 2021-12-19: 500 mL via INTRAVENOUS

## 2021-12-18 MED ORDER — PANTOPRAZOLE 80MG IVPB - SIMPLE MED
80.0000 mg | Freq: Once | INTRAVENOUS | Status: AC
  Administered 2021-12-18: 80 mg via INTRAVENOUS
  Filled 2021-12-18: qty 80

## 2021-12-18 MED ORDER — GLUCERNA SHAKE PO LIQD
237.0000 mL | Freq: Three times a day (TID) | ORAL | Status: DC
  Administered 2021-12-18: 237 mL via ORAL

## 2021-12-18 MED ORDER — HYDROCODONE-ACETAMINOPHEN 5-325 MG PO TABS
1.0000 | ORAL_TABLET | Freq: Four times a day (QID) | ORAL | Status: DC | PRN
  Administered 2021-12-19 (×3): 1 via ORAL
  Filled 2021-12-18 (×3): qty 1

## 2021-12-18 NOTE — ED Triage Notes (Signed)
Interpreter # [phone] Pt reports black stools that started this weekend. Reports recently admitted for same  Pt reports when he was last here he was told he has cancer, possibly esophageal.   First radiation treatment tomorrow  NAD noted

## 2021-12-18 NOTE — ED Notes (Signed)
Pt provided a sandwich box and water.

## 2021-12-18 NOTE — Consult Note (Signed)
Consultation  Referring Provider: Dr Tobie Poet     Admit date 12/18/21 Consult date       12/18/21  Reason for Consultation:     melena         HPI:   Samuel Pennington is a 56 y.o. male with recently diagnosed esophageal cancer, niddm, and HL, admitted today for symptomatic anemia/melena. He is received prbcs currently and it is noted he has appt with Dr Grayland Ormond tomorrow to start his cancer treatment at 10am.  Note patient was consulted last month by Dr Haig Prophet for melena/constipation, symptomatic anemia with hgb 4.7- underwent endoscopes as below. He received prbcs at that time, started Iv Fe and was consulted by Dr Grayland Ormond (oncology). Discharge hgb was 8.8. he was treated with oral acyclovir for the herpes esophagitis; he had CT/pet and eus to stage his esophageal cancer.  He states he was feeling better at the time of his last discharge- however about a week ago started having some black stools and became easily fatigued. He then developed sob and dizziness, which precipitated his ed visit today. He endorses some odynophagia with fruit juices but not necessarily with other foods. States he is eating ok but not great. He does report ongoing chest pain, consistent with his pain from last visit. Denies any abdominal pain, NV, further gi concerns. States his last melanotic stool was yesterday. Denies nnsaids. Has some questions about what foods to eat. He is not taking any vitamins, nutritional supplements, or ppi/h2ra at this time. He continues to follow with [facility] pain clinic for his chronic back pain.  He is currently receiving prbcs for admit hgb 5.6. Bun/creatinine normal. PREVIOUS ENDOSCOPIES:            EUS 12/14/21- Dr Mont Dutton- large ulcerative mass at the GEJ/gastric cardia, hematin in stomach, normal stomch otherwise and duodenum appeared normal. Mass measured 37-82mm with well defined borders and there was sonographic evidence suggesting invasion into the serosa. Staging noted T3 N0MX. There was a  benign appearing lymph node at the subcarinal mediastinum.  EGD 11/17/21- Dr Haig Prophet- large fungating mass in the middle third of esophagus, duodenal nodule. Normal stomach. 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 There was inflamed granulation tissue with fibrinopurulent material consistent with ulceration. 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 Colonoscopy 11/14/21- Dr Haig Prophet- small transverse adenoma. hemorrhoids  PET 12/13/21- IMPRESSION: 1. Hypermetabolic GE junction mass, currently centered in the gastric cardia. 2. No evidence of hypermetabolic nodal or distant metastasis. 3. Incidental findings, including: Coronary artery atherosclerosis. Aortic Atherosclerosis (ICD10-I70.0). Prostatomegaly.  CT a/P 11/15/21:IMPRESSION: 1. Distal esophageal/gastroesophageal junction mass without evidence of metastatic disease. 2. Enlarged prostate. 3. Aortic atherosclerosis (ICD10-I70.0). Coronary artery calcification  Past Medical History:  Diagnosis Date   Cancer ([place])    Diabetes mellitus without complication ([place])    Hyperlipidemia    Neuromuscular disorder ([place])     Past Surgical History:  Procedure Laterality Date   COLONOSCOPY WITH PROPOFOL N/A 11/14/2021   Procedure: COLONOSCOPY WITH PROPOFOL;  Surgeon: Lesly Rubenstein, MD;  Location: [facility] ENDOSCOPY;  Service: Endoscopy;  Laterality: N/A;   ESOPHAGOGASTRODUODENOSCOPY (EGD) WITH PROPOFOL N/A 11/14/2021   Procedure: ESOPHAGOGASTRODUODENOSCOPY (EGD) WITH PROPOFOL;  Surgeon: Lesly Rubenstein, MD;  Location: [facility] ENDOSCOPY;  Service: Endoscopy;  Laterality: N/A;   EUS N/A 12/14/2021   Procedure: FULL UPPER ENDOSCOPIC ULTRASOUND (EUS) RADIAL;  Surgeon: Holly Bodily, MD;  Location: [facility] ENDOSCOPY;  Service: Gastroenterology;  Laterality: N/A;  SPANISH INTERPRETER  Family History  Problem Relation Age of Onset   Hypertension Mother      Social  History   Tobacco Use   Smoking status: Never   Smokeless tobacco: Never  Vaping Use   Vaping Use: Never used  Substance Use Topics   Alcohol use: Yes    Comment: occassionallh   Drug use: Never    Prior to Admission medications   Medication Sig Start Date End Date Taking? Authorizing Provider  atorvastatin (LIPITOR) 40 MG tablet TOMA UNA TABLETA POR BOCA AL ACOSTARSE PARA COLESTEROL ALTO 07/06/20 12/18/21 Yes Revelo, Elyse Jarvis, MD  glipiZIDE (GLUCOTROL XL) 5 MG 24 hr tablet TOME 1 TABLETA POR BOCA DIARIO PARA LA DIABETES 12/13/20 12/18/21 Yes Revelo, Elyse Jarvis, MD  metFORMIN (GLUCOPHAGE) 1000 MG tablet Take 1,000 mg by mouth daily. 08/27/18  Yes Revelo, Elyse Jarvis, MD  oxyCODONE (OXY IR/ROXICODONE) 5 MG immediate release tablet Take 10 mg by mouth every 6 (six) hours as needed for pain. 11/06/21  Yes [provider]  HYDROcodone-acetaminophen (NORCO/VICODIN) 5-325 MG tablet Take 1 tablet by mouth every 6 (six) hours as needed for moderate pain. Patient not taking: Reported on 12/18/2021 12/12/21   Lloyd Huger, MD  nortriptyline (PAMELOR) 25 MG capsule Take 25 mg by mouth at bedtime. Patient not taking: Reported on 12/18/2021    [provider]    Current Facility-Administered Medications  Medication Dose Route Frequency Provider Last Rate Last Admin   acetaminophen (TYLENOL) tablet 1,000 mg  1,000 mg Oral Q6H PRN Cox, Amy N, DO       Or   acetaminophen (TYLENOL) suppository 650 mg  650 mg Rectal Q6H PRN Cox, Amy N, DO       [START ON 12/19/2021] atorvastatin (LIPITOR) tablet 40 mg  40 mg Oral QHS Cox, Amy N, DO       HYDROcodone-acetaminophen (NORCO/VICODIN) 5-325 MG per tablet 1 tablet  1 tablet Oral Q6H PRN Cox, Amy N, DO       insulin aspart (novoLOG) injection 0-15 Units  0-15 Units Subcutaneous TID WC Cox, Amy N, DO       insulin aspart (novoLOG) injection 0-5 Units  0-5 Units Subcutaneous QHS Cox, Amy N, DO       morphine 2 MG/ML injection 1 mg  1  mg Intravenous Q4H PRN Cox, Amy N, DO       ondansetron (ZOFRAN) tablet 4 mg  4 mg Oral Q6H PRN Cox, Amy N, DO       Or   ondansetron (ZOFRAN) injection 4 mg  4 mg Intravenous Q6H PRN Cox, Amy N, DO       oxyCODONE (Oxy IR/ROXICODONE) immediate release tablet 10 mg  10 mg Oral Q6H PRN Cox, Amy N, DO   10 mg at 12/18/21 1429   Current Outpatient Medications  Medication Sig Dispense Refill   atorvastatin (LIPITOR) 40 MG tablet TOMA UNA TABLETA POR BOCA AL ACOSTARSE PARA COLESTEROL ALTO 90 tablet 3   glipiZIDE (GLUCOTROL XL) 5 MG 24 hr tablet TOME 1 TABLETA POR BOCA DIARIO PARA LA DIABETES 90 tablet 1   metFORMIN (GLUCOPHAGE) 1000 MG tablet Take 1,000 mg by mouth daily.     oxyCODONE (OXY IR/ROXICODONE) 5 MG immediate release tablet Take 10 mg by mouth every 6 (six) hours as needed for pain.     HYDROcodone-acetaminophen (NORCO/VICODIN) 5-325 MG tablet Take 1 tablet by mouth every 6 (six) hours as needed for moderate pain. (Patient not taking: Reported on 12/18/2021) 30 tablet  0   nortriptyline (PAMELOR) 25 MG capsule Take 25 mg by mouth at bedtime. (Patient not taking: Reported on 12/18/2021)      Allergies as of 12/18/2021   (No Known Allergies)     Review of Systems:    All systems reviewed and negative except where noted in HPI.      Physical Exam:  Vital signs in last 24 hours: Temp:  [98.1 F (36.7 C)-98.6 F (37 C)] 98.5 F (36.9 C) (01/09 1423) Pulse Rate:  [80-90] 84 (01/09 1423) Resp:  [15-18] 15 (01/09 1423) BP: (94-136)/(52-73) 97/52 (01/09 1423) SpO2:  [98 %-100 %] 100 % (01/09 1423) Weight:  [33 kg] 67 kg (01/09 0906)   General:   Pleasant man in NAD Head:  Normocephalic and atraumatic. Eyes:   No icterus.   Conjunctiva pink. Ears:  Normal auditory acuity. Mouth: Mucosa pink moist, no lesions. Neck:  Supple; no masses felt Lungs:  Respirations even and unlabored. Lungs clear to auscultation bilaterally.   No wheezes, crackles, or rhonchi.  Heart:  S1S2, RRR, no  MRG. No edema. Abdomen:   Flat, soft, nondistended, nontender. Normal bowel sounds. No appreciable masses or hepatomegaly. No rebound signs or other peritoneal signs. Rectal:  Not performed.  Msk:  MAEW x4, No clubbing or cyanosis. Strength 5/5. Symmetrical without gross deformities. Neurologic:  Alert and  oriented x4;  Cranial nerves II-XII intact.  Skin:  Warm, dry, pink without significant lesions or rashes. Psych:  Alert and cooperative. Normal affect.  LAB RESULTS: Recent Labs    12/18/21 0908  WBC 7.3  HGB 5.6*  HCT 18.8*  PLT 328   BMET Recent Labs    12/18/21 0908  NA 137  K 4.2  CL 102  CO2 26  GLUCOSE 188*  BUN 15  CREATININE 0.83  CALCIUM 8.4*   LFT Recent Labs    12/18/21 0908  PROT 6.8  ALBUMIN 3.5  AST 19  ALT 11  ALKPHOS 57  BILITOT 0.5   PT/INR No results for input(s): LABPROT, INR in the last 72 hours.  STUDIES: No results found.     Impression / Plan:   Melena/symptomatic anemia- relating to his ulcerating esophageal mass- likely a slow loss. He is hemodynamically stable and not having signs of gi blood loss today so endoscopy not likely to add benefit to his care at this time. Agree with transfusions to improve hgb >7-8. Consider PPI/carafate. Recommend nutritional consult to help maximize his nutrition in anticipation of his cancer treatment. Note patient wants to keep his appt tomorrow- this could be done as o/p. He can also take some otc mvi drops and glucerna nutritional supplement  Thank you very much for this consult. These services were provided by Stephens November, NP-C, in collaboration with Lesly Rubenstein, MD, with whom I have discussed this patient in full.   Stephens November, NP-C

## 2021-12-18 NOTE — ED Provider Notes (Signed)
[facility] Provider Note    Event Date/Time   First MD Initiated Contact with Patient 12/18/21 1156     (approximate)   History   Rectal Bleeding   HPI  Samuel Pennington is a 56 y.o. male with past medical history of diabetes, hypertension, hyperlipidemia, esophageal mass who presents with dark stools and fatigue.  I reviewed patient's recent admission from 11/15/2021, patient was admitted with dark stools and hemoglobin of 4.7.  He had a colonoscopy that showed 1 polyp and an EGD which showed an esophageal mass that was highly concerning for malignancy.  CT chest abdomen pelvis did not show any signs of metastatic disease.  He was transfused 3 units and hemoglobin improved to around 8 plan was to follow-up with Dr. Grayland Ormond.  Patient also had an endoscopic ultrasound done on 12/14/2021.  Patient tells me he is planned to start radiation treatment tomorrow.  He notes that when he left the hospital he was feeling better and had normal stools for about a week but then started having black stools again.  Over the last several days has been progressively fatigued with some dyspnea.  He endorses some discomfort in his chest but says that this is chronic and he thinks that this is likely due to his cancer.  Denies any hematemesis but does have nausea and decreased p.o. intake.     Past Medical History:  Diagnosis Date   Cancer ([place])    Diabetes mellitus without complication ([place])    Hyperlipidemia    Neuromuscular disorder [facility])     Patient Active Problem List   Diagnosis Date Noted   Upper GI bleed 12/18/2021   Esophageal adenocarcinoma ([facility])    Esophageal mass    GIB (gastrointestinal bleeding) 11/12/2021   Iron deficiency anemia    GI bleeding 11/11/2021   Blurry vision 11/11/2021   Diabetes mellitus without complication ([facility])    Hyperlipidemia    Chest pain    Acute blood loss anemia    DM2 (diabetes mellitus, type 2) ([place]) 10/02/2018   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 --      Excl. in [place]? --     Most recent vital signs: Vitals:   12/18/21 1200 12/18/21 1210  BP: 136/73   Pulse: 90 80  Resp:  16  Temp:    SpO2: 98% 100%     General: Awake, no distress.  CV:  Good peripheral perfusion.  Resp:  Normal effort.  Abd:  No distention.  Neuro:  Awake, Alert, Oriented x 3  Other:     ED Results / Procedures / Treatments  Labs (all labs ordered are listed, but only abnormal results are displayed) Labs Reviewed  COMPREHENSIVE METABOLIC PANEL - Abnormal; Notable for the following components:      Result Value   Glucose, Bld 188 (*)    Calcium 8.4 (*)    All other components within normal limits  CBC - Abnormal; Notable for the following components:   RBC 2.07 (*)    Hemoglobin 5.6 (*)    HCT 18.8 (*)    MCHC 29.8 (*)    RDW 16.3 (*)    All other components within normal limits  RESP PANEL BY RT-PCR (FLU A&B, COVID) ARPGX2  HEMOGLOBIN AND HEMATOCRIT, BLOOD  POC OCCULT BLOOD, ED  TYPE AND SCREEN  PREPARE RBC (CROSSMATCH)     EKG     RADIOLOGY    PROCEDURES:  Critical Care performed: Yes, see critical care procedure note(s)  .1-3 Lead EKG Interpretation Performed by: Rada Hay, MD Authorized by: Rada Hay, MD     Interpretation: normal     ECG rate assessment: normal      Ectopy: none     Conduction: normal   .Critical Care Performed by: Rada Hay, MD Authorized by: Rada Hay, MD   Critical care provider statement:    Critical care time (minutes):  30   Critical care was time spent personally by me on the following activities:  Development of treatment plan with patient or surrogate, discussions with consultants, evaluation of patient's response to treatment, examination of patient, ordering and review of laboratory studies, ordering and review of radiographic studies, ordering and performing treatments and interventions, pulse oximetry, re-evaluation of patient's condition and review of old charts  The patient is on the cardiac monitor to evaluate for evidence of arrhythmia and/or significant heart rate changes.   MEDICATIONS ORDERED IN ED: Medications  0.9 %  sodium chloride infusion (has no administration in time range)  pantoprazole (PROTONIX) 80 mg /NS 100 mL IVPB (80 mg Intravenous New Bag/Given 12/18/21 1336)  HYDROcodone-acetaminophen (NORCO/VICODIN) 5-325 MG per tablet 1 tablet (has no administration in time range)  atorvastatin (LIPITOR) tablet 40 mg (has no administration in time range)  acetaminophen (TYLENOL) tablet 1,000 mg (has no administration in time range)    Or  acetaminophen (TYLENOL) suppository 650 mg (has no administration in time range)  ondansetron (ZOFRAN) tablet 4 mg (has no administration in time range)    Or  ondansetron (ZOFRAN) injection 4 mg (has no administration in time range)  insulin aspart (novoLOG) injection 0-15 Units (has no administration in time range)  insulin aspart (novoLOG) injection 0-5 Units (has no administration in time range)     IMPRESSION / MDM / ASSESSMENT AND PLAN / ED COURSE  I reviewed the triage vital signs and the nursing notes.                              Differential diagnosis includes, but is not limited to, upper GI bleed, bleeding secondary to esophageal mass,  symptomatic anemia  The patient is a 56 year old male with newly diagnosed esophageal mass and prior upper GI bleed secondary to this who presents with fatigue and dark stools.  Recently admitted for similar and had been doing well for only about a week after admission and then having dark stools again for the last 2 weeks.  Feeling more fatigued over the last several days.  He is hemodynamically stable with normal vital signs.  He is nontoxic on exam.  Abdomen is benign.  Labs are notable for hemoglobin of 5.7, otherwise his CMP is unremarkable.  Suspect upper GI bleed secondary to the known esophageal mass.  Will give him a dose of Protonix and transfused 2 units of PRBCs.  He will require admission to the hospital.  Discussed with hospitalist for admission     FINAL CLINICAL IMPRESSION(S) / ED DIAGNOSES   Final diagnoses:  Symptomatic anemia  UGIB (upper gastrointestinal bleed)     Rx / DC Orders   ED Discharge Orders     None        Note:  This document was prepared using Dragon voice recognition software and may include unintentional dictation errors.   Rada Hay, MD 12/18/21 [phone]

## 2021-12-18 NOTE — Progress Notes (Signed)
[facility] - No Charge  Outpatient oncology clinic appointment, with Dr. Grayland Ormond - Has been changed to 12/20/2021 at 90 AM - Dr. Gary Fleet clinic has requested that patient arrive by 10:45 AM for lab draw - I have communicated with patient at bedside that his appointment has been changed to 12/20/2021 and that he needs to arrive by 10:45 AM for lab draw. - Google English to Spanish translation utilized to give patients the above instruction  Dr. Tobie Poet [facility]

## 2021-12-18 NOTE — H&P (Addendum)
History and Physical   Samuel Pennington ENI:[id number] DOB: 07-30-1966 DOA: 12/18/2021  PCP: Theotis Burrow, MD  Outpatient Specialists: Dr. Grayland Ormond, medical oncology Patient coming from:   I have personally briefly reviewed patient's old medical records in [place].  Chief Concern: Black stools  HPI: Samuel Pennington is a 56 y.o. male with medical history significant for non-insulin-dependent diabetes mellitus, hyperlipidemia, squamous cell esophageal neoplasm, who presents to the emergency department for chief concerns of black stools for approximately 1 week.  At bedside he is able to tell me his name, his age, his current location.  He is awake and alert and oriented and able to provide me his full HPI.  He states he has been having black stools that has been gradual onset again in the last 1 week.  He denies any bright red blood.  He denies any fever, cough, chills, abdominal pain.  He also endorses shortness of breath and chest pain with breathing.  He denies any trauma.  He denies any diarrhea, dysuria, syncopal episode.  He reports that in the last 6 months he has lost approximately 15 pounds though he is not 100% sure.  NOTE: patient has oncology appointment with Dr. Grayland Ormond at 10 AM outpatient on 12/19/21. Patient would like to make that appointment in order to establish plan of care for his GI carcinoma.  Social history: He is a former tobacco user, quitting greater than 20 years ago.  He endorses infrequent EtOH, stating that he will drink 1-2 beers per week.  He denies any history of recreational drug use.  Prior to cancer diagnosis, he worked as a Brewing technologist.  Vaccination history: He is vaccinated for COVID-19 with 3 doses.  ROS: Constitutional: + weight change, no fever ENT/Mouth: no sore throat, no rhinorrhea Eyes: no eye pain, no vision changes Cardiovascular: no chest pain, no dyspnea,  no edema, no palpitations Respiratory: no cough, no sputum,  no wheezing Gastrointestinal: no nausea, no vomiting, no diarrhea, no constipation Genitourinary: no urinary incontinence, no dysuria, no hematuria Musculoskeletal: no arthralgias, no myalgias Skin: no skin lesions, no pruritus, Neuro: + weakness, no loss of consciousness, no syncope Psych: no anxiety, no depression, + decrease appetite Heme/Lymph: no bruising, no bleeding  ED Course: Discussed with emergency medicine provider, patient requiring hospitalization for chief concerns of acute blood loss anemia.  Vitals in the emergency department showed temperature of 98.6, respiration rate of 18, heart rate of 87, blood pressure 136/67, SPO2 100% on room air.  Serum sodium is 137, potassium 4.2, chloride 102, bicarb of 26, BUN of 15, serum creatinine of 0.83, nonfasting blood glucose 188, GFR greater than 60.  WBC was 7.3, hemoglobin is 5.6, platelets 328.  In the emergency department patient was ordered 2 units of blood with transfusion, Protonix gtt. and bolus.  Assessment/Plan  Principal Problem:   Upper GI bleed Active Problems:   Diabetes mellitus without complication (HCC)   Hyperlipidemia   Acute blood loss anemia   Iron deficiency anemia   Esophageal mass   Esophageal adenocarcinoma (HCC)   # Black stool-in setting of acute anemia with recent history of upper GI mass, this is presumed to be secondary to upper GI bleed - Continue Protonix gtt. - 2 units of blood has been ordered for transfusion by EDP, we will monitor - Repeat hemoglobin hematocrit scheduled for 1630 on day of admission - GI service, Dr. Haig Prophet has been consulted - Clear liquid diet now, n.p.o. except for sips with  meds and ice chips after med  # Non-insulin-dependent diabetes mellitus-holding home oral antiglycemic agents at this time, metformin 1000 mg daily and glipizide 5 mg daily - Insulin SSI with at bedtime coverage ordered - Goal inpatient blood glucose is 140-180  # Hyperlipidemia-atorvastatin  40 mg nightly ordered  # Chronic pain syndrome-resumed home hydrocodone-acetaminophen 5-3 25, 1 tablet every 6 hours as needed for moderate pain - Patient takes oxycodone 5 mg every 6 hours as needed for pain, which has been resumed - Morphine 1 mg IV every 4 hours as needed for severe pain, 18 hours ordered  # DVT prophylaxis-I have not ordered pharmacologic DVT prophylaxis due to acute active bleeding - Primary team to initiate pharmacologic DVT prophylaxis when appropriate  NOTE: patient has oncology appointment with Dr. Grayland Ormond at 10 AM outpatient on 12/19/21. Patient would like to make that appointment in order to establish plan of care for his GI carcinoma. - I sent a message to Dr. Grayland Ormond via secure chat for possible change of his outpatient appointment and Dr. Grayland Ormond responded that they will try to move his appointment  Chart reviewed.   Staging endoscopy on 12/14/2021: Was read as squamous cell esophageal neoplasm with large ulcerating mass at the GE junction/gastric cardia.  Normal examined duodenum.  - EUS impressions: Mass was found at the [place] J/cardia of the stomach.  Tissue diagnosis was obtained prior to this exam. This is consistent with squamous cell carcinoma.  Stage T3N0MX by endosonographic criteria.  1 benign lymph node was visualized in the subcarinal mediastinum (level 7) tissue has not been obtained.  However endosonographic appearance is suggestive of benign inflammatory changes.  Normal visualized portions of the liver. Normal celiac region.  DVT prophylaxis: TED hose Code Status: Full code Diet: No except for sips with meds and ice Family Communication: no Disposition Plan: Pending clinical course, no Consults called: GI Admission status: Telemetry medical, observation  Past Medical History:  Diagnosis Date   Cancer ([place])    Diabetes mellitus without complication ([place])    Hyperlipidemia    Neuromuscular disorder ([place])    Past Surgical History:  Procedure  Laterality Date   COLONOSCOPY WITH PROPOFOL N/A 11/14/2021   Procedure: COLONOSCOPY WITH PROPOFOL;  Surgeon: Lesly Rubenstein, MD;  Location: [facility] ENDOSCOPY;  Service: Endoscopy;  Laterality: N/A;   ESOPHAGOGASTRODUODENOSCOPY (EGD) WITH PROPOFOL N/A 11/14/2021   Procedure: ESOPHAGOGASTRODUODENOSCOPY (EGD) WITH PROPOFOL;  Surgeon: Lesly Rubenstein, MD;  Location: [facility] ENDOSCOPY;  Service: Endoscopy;  Laterality: N/A;   EUS N/A 12/14/2021   Procedure: FULL UPPER ENDOSCOPIC ULTRASOUND (EUS) RADIAL;  Surgeon: Holly Bodily, MD;  Location: [facility] ENDOSCOPY;  Service: Gastroenterology;  Laterality: N/A;  SPANISH INTERPRETER   Social History:  reports that he has never smoked. He has never used smokeless tobacco. He reports current alcohol use. He reports that he does not use drugs.  No Known Allergies Family History  Problem Relation Age of Onset   Hypertension Mother    Family history: Family history reviewed and not pertinent.  Prior to Admission medications   Medication Sig Start Date End Date Taking? Authorizing Provider  atorvastatin (LIPITOR) 40 MG tablet TOMA UNA TABLETA POR BOCA AL ACOSTARSE PARA COLESTEROL ALTO 07/06/20 11/11/21  Revelo, Elyse Jarvis, MD  glipiZIDE (GLUCOTROL XL) 5 MG 24 hr tablet TOME 1 TABLETA POR BOCA DIARIO PARA LA DIABETES 12/13/20 12/13/21  Revelo, Elyse Jarvis, MD  HYDROcodone-acetaminophen (NORCO/VICODIN) 5-325 MG tablet Take 1 tablet by mouth every 6 (six) hours as needed for  moderate pain. 12/12/21   Lloyd Huger, MD  metFORMIN (GLUCOPHAGE) 1000 MG tablet Take 1,000 mg by mouth daily. 08/27/18   Revelo, Elyse Jarvis, MD  oxyCODONE (OXY IR/ROXICODONE) 5 MG immediate release tablet Take 5 mg by mouth every 6 (six) hours as needed for pain. 11/06/21   [provider]   Physical Exam: Vitals:   12/18/21 0905 12/18/21 0906 12/18/21 1200 12/18/21 1210  BP: 136/67  136/73   Pulse: 87  90 80  Resp: 18   16  Temp:  98.6 F (37 C)     TempSrc:  Oral    SpO2: 100%  98% 100%  Weight:  67 kg    Height:  5\' 6"  (1.676 m)     Constitutional: appears age-appropriate, frail, NAD, calm, comfortable Eyes: PERRL, lids and conjunctivae normal ENMT: Mucous membranes are moist. Posterior pharynx clear of any exudate or lesions. Age-appropriate dentition. Hearing appropriate Neck: normal, supple, no masses, no thyromegaly Respiratory: clear to auscultation bilaterally, no wheezing, no crackles. Normal respiratory effort. No accessory muscle use.  Cardiovascular: Regular rate and rhythm, no murmurs / rubs / gallops. No extremity edema. 2+ pedal pulses. No carotid bruits.  Abdomen: no tenderness, no masses palpated, no hepatosplenomegaly. Bowel sounds positive.  Musculoskeletal: no clubbing / cyanosis. No joint deformity upper and lower extremities. Good ROM, no contractures, no atrophy. Normal muscle tone.  Skin: no rashes, lesions, ulcers. No induration Neurologic: Sensation intact. Strength 5/5 in all 4.  Psychiatric: Normal judgment and insight. Alert and oriented x 3. Normal mood.   EKG: independently reviewed, showing sinus rhythm with rate of 84, QTC 428  Chest x-ray on Admission: I personally reviewed portable chest x-ray and agree with radiology reading.  Labs on Admission: I have personally reviewed following labs  CBC: Recent Labs  Lab 12/18/21 0908  WBC 7.3  HGB 5.6*  HCT 18.8*  MCV 90.8  PLT 578   Basic Metabolic Panel: Recent Labs  Lab 12/18/21 0908  NA 137  K 4.2  CL 102  CO2 26  GLUCOSE 188*  BUN 15  CREATININE 0.83  CALCIUM 8.4*   GFR: Estimated Creatinine Clearance: 88.6 mL/min (by C-G formula based on SCr of 0.83 mg/dL).  Liver Function Tests: Recent Labs  Lab 12/18/21 0908  AST 19  ALT 11  ALKPHOS 57  BILITOT 0.5  PROT 6.8  ALBUMIN 3.5   CBG: Recent Labs  Lab 12/13/21 1239 12/14/21 1257  GLUCAP 124* 126*   Dr. Tobie Poet [facility]  If 7PM-7AM, please contact  overnight-coverage provider If 7AM-7PM, please contact day coverage provider [email or web address]  12/18/2021, 2:04 PM

## 2021-12-19 ENCOUNTER — Inpatient Hospital Stay: Payer: Self-pay | Admitting: Oncology

## 2021-12-19 ENCOUNTER — Ambulatory Visit: Payer: Self-pay | Admitting: Radiation Oncology

## 2021-12-19 ENCOUNTER — Inpatient Hospital Stay: Payer: Self-pay

## 2021-12-19 DIAGNOSIS — C159 Malignant neoplasm of esophagus, unspecified: Secondary | ICD-10-CM

## 2021-12-19 DIAGNOSIS — D649 Anemia, unspecified: Secondary | ICD-10-CM | POA: Diagnosis present

## 2021-12-19 LAB — HEMOGLOBIN AND HEMATOCRIT, BLOOD
HCT: 21.1 % — ABNORMAL LOW (ref 39.0–52.0)
HCT: 27.4 % — ABNORMAL LOW (ref 39.0–52.0)
Hemoglobin: 7.1 g/dL — ABNORMAL LOW (ref 13.0–17.0)
Hemoglobin: 9.2 g/dL — ABNORMAL LOW (ref 13.0–17.0)

## 2021-12-19 LAB — COMPREHENSIVE METABOLIC PANEL
ALT: 9 U/L (ref 0–44)
AST: 14 U/L — ABNORMAL LOW (ref 15–41)
Albumin: 2.8 g/dL — ABNORMAL LOW (ref 3.5–5.0)
Alkaline Phosphatase: 43 U/L (ref 38–126)
Anion gap: 7 (ref 5–15)
BUN: 9 mg/dL (ref 6–20)
CO2: 22 mmol/L (ref 22–32)
Calcium: 7.8 mg/dL — ABNORMAL LOW (ref 8.9–10.3)
Chloride: 106 mmol/L (ref 98–111)
Creatinine, Ser: 0.53 mg/dL — ABNORMAL LOW (ref 0.61–1.24)
GFR, Estimated: >60 mL/min (ref >60)
Glucose, Bld: 101 mg/dL — ABNORMAL HIGH (ref 70–99)
Potassium: 3.8 mmol/L (ref 3.5–5.1)
Sodium: 135 mmol/L (ref 135–145)
Total Bilirubin: 0.6 mg/dL (ref 0.3–1.2)
Total Protein: 5.4 g/dL — ABNORMAL LOW (ref 6.5–8.1)

## 2021-12-19 LAB — HEMOGLOBIN: Hemoglobin: 9.9 g/dL — ABNORMAL LOW (ref 13.0–17.0)

## 2021-12-19 LAB — CBC
HCT: 27.1 % — ABNORMAL LOW (ref 39.0–52.0)
Hemoglobin: 8.6 g/dL — ABNORMAL LOW (ref 13.0–17.0)
MCH: 28.3 pg (ref 26.0–34.0)
MCHC: 31.7 g/dL (ref 30.0–36.0)
MCV: 89.1 fL (ref 80.0–100.0)
Platelets: 165 10*3/uL (ref 150–400)
RBC: 3.04 MIL/uL — ABNORMAL LOW (ref 4.22–5.81)
RDW: 15.7 % — ABNORMAL HIGH (ref 11.5–15.5)
WBC: 6.2 10*3/uL (ref 4.0–10.5)
nRBC: 0 % (ref 0.0–0.2)

## 2021-12-19 LAB — MAGNESIUM: Magnesium: 2 mg/dL (ref 1.7–2.4)

## 2021-12-19 LAB — CBG MONITORING, ED
Glucose-Capillary: 98 mg/dL (ref 70–99)
Glucose-Capillary: 171 mg/dL — ABNORMAL HIGH (ref 70–99)

## 2021-12-19 LAB — PHOSPHORUS: Phosphorus: 4.3 mg/dL (ref 2.5–4.6)

## 2021-12-19 LAB — GLUCOSE, CAPILLARY
Glucose-Capillary: 113 mg/dL — ABNORMAL HIGH (ref 70–99)
Glucose-Capillary: 90 mg/dL (ref 70–99)

## 2021-12-19 LAB — PREPARE RBC (CROSSMATCH)

## 2021-12-19 MED ORDER — SODIUM CHLORIDE 0.9% IV SOLUTION
Freq: Once | INTRAVENOUS | Status: DC
  Filled 2021-12-19: qty 250

## 2021-12-19 MED ORDER — SUCRALFATE 1 GM/10ML PO SUSP
1.0000 g | Freq: Three times a day (TID) | ORAL | Status: DC
  Administered 2021-12-19 – 2021-12-20 (×4): 1 g via ORAL
  Filled 2021-12-19 (×7): qty 10

## 2021-12-19 MED ORDER — PANTOPRAZOLE SODIUM 40 MG PO TBEC
40.0000 mg | DELAYED_RELEASE_TABLET | Freq: Every day | ORAL | Status: DC
  Administered 2021-12-19 – 2021-12-20 (×2): 40 mg via ORAL
  Filled 2021-12-19 (×2): qty 1

## 2021-12-19 NOTE — ED Notes (Signed)
Pt is requesting a hospital bed and a remote, states he thinks his may have been thrown away

## 2021-12-19 NOTE — ED Notes (Signed)
Hospital bed brought into pt's room and remote given to pt for comfort and entertainment, pt grateful

## 2021-12-19 NOTE — ED Notes (Signed)
Lab contacted about H&H for pt and is running bloodwork now.

## 2021-12-19 NOTE — Progress Notes (Signed)
GI Inpatient Follow-up Note  Subjective:  Patient with no overt GI bleeding. Required some more blood.  Scheduled Inpatient Medications:   sodium chloride   Intravenous Once   atorvastatin  40 mg Oral QHS   insulin aspart  0-15 Units Subcutaneous TID WC   insulin aspart  0-5 Units Subcutaneous QHS   pantoprazole  40 mg Oral Daily    Continuous Inpatient Infusions:    PRN Inpatient Medications:  acetaminophen **OR** acetaminophen, HYDROcodone-acetaminophen, ondansetron **OR** ondansetron (ZOFRAN) IV, oxyCODONE  Review of Systems:  Review of Systems  Constitutional:  Negative for chills and fever.  Respiratory:  Negative for shortness of breath.   Cardiovascular:  Negative for chest pain.  Gastrointestinal:  Negative for blood in stool, melena and vomiting.  Skin:  Negative for itching and rash.  Neurological:  Negative for focal weakness.  Psychiatric/Behavioral:  Negative for substance abuse.   All other systems reviewed and are negative.    Physical Examination: BP 97/60    Pulse (!) 57    Temp 98.2 F (36.8 C) (Oral)    Resp 14    Ht 5\' 6"  (1.676 m)    Wt 67 kg    SpO2 100%    BMI 23.84 kg/m  Gen: NAD, alert and oriented x 4 HEENT: PEERLA, EOMI, Neck: supple, no JVD or thyromegaly Chest: No respiratory distress Abd: soft, non-tender, non-distended Ext: no edema, well perfused with 2+ pulses, Skin: no rash or lesions noted Lymph: no lymphadenopathy  Data: Lab Results  Component Value Date   WBC 6.2 12/19/2021   HGB 9.2 (L) 12/19/2021   HCT 27.4 (L) 12/19/2021   MCV 89.1 12/19/2021   PLT 165 12/19/2021   Recent Labs  Lab 12/19/21 0030 12/19/21 0512 12/19/21 0801  HGB 7.1* 8.6* 9.2*   Lab Results  Component Value Date   NA 135 12/19/2021   K 3.8 12/19/2021   CL 106 12/19/2021   CO2 22 12/19/2021   BUN 9 12/19/2021   CREATININE 0.53 (L) 12/19/2021   Lab Results  Component Value Date   ALT 9 12/19/2021   AST 14 (L) 12/19/2021   ALKPHOS 43  12/19/2021   BILITOT 0.6 12/19/2021   No results for input(s): APTT, INR, PTT in the last 168 hours. Assessment/Plan: [name] is a 56 y.o. gentleman with esophageal cancer with anemia likely secondary to ulcerated mass. No hemodynamically significant bleeding. No real good options at the moment, if hemodynamically significant bleeding could consider hemospray of IR but currently stable  Recommendations:  - continue PPI, added carafate suspension - trend CBC's daily - transfuse for hemoglobin < 7 - supportive care  Please call with any questions or concerns.  Raylene Miyamoto MD, MPH [place]

## 2021-12-19 NOTE — Progress Notes (Addendum)
PROGRESS NOTE    Samuel Pennington  NID:[id number] DOB: [birth year] DOA: 12/18/2021 PCP: Theotis Burrow, MD  Outpatient Specialists: oncology    Brief Narrative:   From admission h and p Samuel Pennington is a 56 y.o. male with medical history significant for non-insulin-dependent diabetes mellitus, hyperlipidemia, squamous cell esophageal neoplasm, who presents to the emergency department for chief concerns of black stools for approximately 1 week.   At bedside he is able to tell me his name, his age, his current location.  He is awake and alert and oriented and able to provide me his full HPI.   He states he has been having black stools that has been gradual onset again in the last 1 week.  He denies any bright red blood.  He denies any fever, cough, chills, abdominal pain.  He also endorses shortness of breath and chest pain with breathing.  He denies any trauma.  He denies any diarrhea, dysuria, syncopal episode.   He reports that in the last 6 months he has lost approximately 15 pounds though he is not 100% sure.   Assessment & Plan:   Principal Problem:   Upper GI bleed Active Problems:   Diabetes mellitus without complication (HCC)   Hyperlipidemia   Acute blood loss anemia   Iron deficiency anemia   Esophageal mass   Esophageal adenocarcinoma (HCC)  # Melena # Upper GI bleed # Acute blood loss anemia Hx esophageal cancer s/p recent staging w/ PET and EUS. Admitted last month for symptomatic anemia. Here with 5 days of melena and symptoms (dyspnea, light-headedness. Presenting hgb 5.6 from baseline in the 8s. 4th unit of blood currently transfusing. No hematemesis. Last BM was 1/8 which was melanotic. Benign abdomen. GI is following, advising monitoring for now, hemospray vs ir intervention if continues to bleed. - monitor hgb today and for s/s bleeding - if post-transfusion stable, symptoms improve, and no sig bleeding anticipate d/c tomorrow - patient was set  to f/u with oncology on 1/11. I have contacted Dr. Grayland Ormond and the patient has been re-scheduled to f/u with him on Thursday, 1/12, at 9 AM  # T2DM Glucose wnl - SSI  # Chronic pain - home oxy   DVT prophylaxis: SCDs Code Status: full Family Communication: none @ bedside  Level of care: Telemetry Medical Status is: Observation  The patient will require care spanning > 2 midnights and should be moved to inpatient because: requiring blood transfusions, monitoring       Consultants:  GI  Procedures: none  Antimicrobials:  none    Subjective: This morning feels well, no presyncope or palpitations  Objective: Vitals:   12/19/21 0630 12/19/21 0700 12/19/21 0720 12/19/21 0730  BP: (!) 86/52 (!) 80/49 100/61 93/63  Pulse: 62 60 63 (!) 57  Resp: 19 14 14 12   Temp:      TempSrc:      SpO2: 100% 100% 100% 100%  Weight:      Height:        Intake/Output Summary (Last 24 hours) at 12/19/2021 0919 Last data filed at 12/19/2021 0520 Gross per 24 hour  Intake 1757.58 ml  Output --  Net 1757.58 ml   Filed Weights   12/18/21 0906  Weight: 67 kg    Examination:  General exam: Appears calm and comfortable  Respiratory system: Clear to auscultation. Respiratory effort normal. Cardiovascular system: S1 & S2 heard, RRR. No JVD, murmurs, rubs, gallops or clicks. No pedal edema. Gastrointestinal system: Abdomen is  nondistended, soft and nontender. No organomegaly or masses felt. Normal bowel sounds heard. Central nervous system: Alert and oriented. No focal neurological deficits. Extremities: Symmetric 5 x 5 power. Skin: No rashes, lesions or ulcers Psychiatry: Judgement and insight appear normal. Mood & affect appropriate.     Data Reviewed: I have personally reviewed following labs and imaging studies  CBC: Recent Labs  Lab 12/18/21 0908 12/18/21 1623 12/18/21 1929 12/19/21 0030 12/19/21 0512 12/19/21 0801  WBC 7.3  --   --   --  6.2  --   HGB 5.6* 6.7*  7.5* 7.1* 8.6* 9.2*  HCT 18.8* 20.9* 22.4* 21.1* 27.1* 27.4*  MCV 90.8  --   --   --  89.1  --   PLT 328  --   --   --  165  --    Basic Metabolic Panel: Recent Labs  Lab 12/18/21 0908 12/19/21 0512  NA 137 135  K 4.2 3.8  CL 102 106  CO2 26 22  GLUCOSE 188* 101*  BUN 15 9  CREATININE 0.83 0.53*  CALCIUM 8.4* 7.8*  MG  --  2.0  PHOS  --  4.3   GFR: Estimated Creatinine Clearance: 91.9 mL/min (A) (by C-G formula based on SCr of 0.53 mg/dL (L)). Liver Function Tests: Recent Labs  Lab 12/18/21 0908 12/19/21 0512  AST 19 14*  ALT 11 9  ALKPHOS 57 43  BILITOT 0.5 0.6  PROT 6.8 5.4*  ALBUMIN 3.5 2.8*   No results for input(s): LIPASE, AMYLASE in the last 168 hours. No results for input(s): AMMONIA in the last 168 hours. Coagulation Profile: No results for input(s): INR, PROTIME in the last 168 hours. Cardiac Enzymes: No results for input(s): CKTOTAL, CKMB, CKMBINDEX, TROPONINI in the last 168 hours. BNP (last 3 results) No results for input(s): PROBNP in the last 8760 hours. HbA1C: No results for input(s): HGBA1C in the last 72 hours. CBG: Recent Labs  Lab 12/13/21 1239 12/14/21 1257 12/18/21 1836 12/18/21 2124  GLUCAP 124* 126* 148* 161*   Lipid Profile: No results for input(s): CHOL, HDL, LDLCALC, TRIG, CHOLHDL, LDLDIRECT in the last 72 hours. Thyroid Function Tests: No results for input(s): TSH, T4TOTAL, FREET4, T3FREE, THYROIDAB in the last 72 hours. Anemia Panel: No results for input(s): VITAMINB12, FOLATE, FERRITIN, TIBC, IRON, RETICCTPCT in the last 72 hours. Urine analysis: No results found for: COLORURINE, APPEARANCEUR, LABSPEC, PHURINE, GLUCOSEU, HGBUR, BILIRUBINUR, KETONESUR, PROTEINUR, UROBILINOGEN, NITRITE, LEUKOCYTESUR Sepsis Labs: @LABRCNTIP (procalcitonin:4,lacticidven:4)  ) Recent Results (from the past 240 hour(s))  Resp Panel by RT-PCR (Flu A&B, Covid) Nasopharyngeal Swab     Status: None   Collection Time: 12/18/21 12:29 PM   Specimen:  Nasopharyngeal Swab; Nasopharyngeal(NP) swabs in vial transport medium  Result Value Ref Range Status   SARS Coronavirus 2 by RT PCR NEGATIVE NEGATIVE Final    Comment: (NOTE) SARS-CoV-2 target nucleic acids are NOT DETECTED.  The SARS-CoV-2 RNA is generally detectable in upper respiratory specimens during the acute phase of infection. The lowest concentration of SARS-CoV-2 viral copies this assay can detect is 138 copies/mL. A negative result does not preclude SARS-Cov-2 infection and should not be used as the sole basis for treatment or other patient management decisions. A negative result may occur with  improper specimen collection/handling, submission of specimen other than nasopharyngeal swab, presence of viral mutation(s) within the areas targeted by this assay, and inadequate number of viral copies(<138 copies/mL). A negative result must be combined with clinical observations, patient history, and epidemiological information.  The expected result is Negative.  Fact Sheet for Patients:  EntrepreneurPulse.com.au  Fact Sheet for Healthcare Providers:  IncredibleEmployment.be  This test is no t yet approved or cleared by the [place] FDA and  has been authorized for detection and/or diagnosis of SARS-CoV-2 by FDA under an Emergency Use Authorization (EUA). This EUA will remain  in effect (meaning this test can be used) for the duration of the COVID-19 declaration under Section 564(b)(1) of the Act, 21 U.S.C.section 360bbb-3(b)(1), unless the authorization is terminated  or revoked sooner.       Influenza A by PCR NEGATIVE NEGATIVE Final   Influenza B by PCR NEGATIVE NEGATIVE Final    Comment: (NOTE) The Xpert Xpress SARS-CoV-2/FLU/RSV plus assay is intended as an aid in the diagnosis of influenza from Nasopharyngeal swab specimens and should not be used as a sole basis for treatment. Nasal washings and aspirates are unacceptable for  Xpert Xpress SARS-CoV-2/FLU/RSV testing.  Fact Sheet for Patients: EntrepreneurPulse.com.au  Fact Sheet for Healthcare Providers: IncredibleEmployment.be  This test is not yet approved or cleared by the [place] FDA and has been authorized for detection and/or diagnosis of SARS-CoV-2 by FDA under an Emergency Use Authorization (EUA). This EUA will remain in effect (meaning this test can be used) for the duration of the COVID-19 declaration under Section 564(b)(1) of the Act, 21 U.S.C. section 360bbb-3(b)(1), unless the authorization is terminated or revoked.  Performed at [facility], [address], [place], [place] [address]          Radiology Studies: [facility] Chest [place] 1 View  Result Date: 12/18/2021 CLINICAL DATA:  Black stools that started this weekend. Recently diagnosed with esophageal adenocarcinoma. EXAM: PORTABLE CHEST 1 VIEW COMPARISON:  Radiographs 11/11/2021 and 02/13/2009. CT 11/15/2021. PET-CT 12/13/2021. FINDINGS: 1502 hours. The heart size and mediastinal contours are stable. There are lower lung volumes with mild resulting atelectasis at both lung bases. No edema, confluent airspace opacity, pleural effusion or pneumothorax. The bones appear unchanged. Telemetry leads overlie the chest. IMPRESSION: No evidence of acute cardiopulmonary process. Suboptimal inspiration with resulting mild bibasilar atelectasis. Electronically Signed   By: Richardean Sale M.D.   On: 12/18/2021 15:13        Scheduled Meds:  sodium chloride   Intravenous Once   atorvastatin  40 mg Oral QHS   feeding supplement (GLUCERNA SHAKE)  237 mL Oral TID BM   insulin aspart  0-15 Units Subcutaneous TID WC   insulin aspart  0-5 Units Subcutaneous QHS   pantoprazole  40 mg Oral BID   sucralfate  1 g Oral TID WC & HS   Continuous Infusions:   LOS: 0 days    Time spent: 40 min    Desma Maxim, MD [facility]   If 7PM-7AM, please  contact night-coverage [email or web address] Password [facility] 12/19/2021, 9:19 AM

## 2021-12-19 NOTE — ED Notes (Signed)
Pt given a fresh cup of coffee with splenda and creamer, pt states that he needs a pain pill, states pain at 6/10

## 2021-12-19 NOTE — Progress Notes (Signed)
° °      CROSS COVER NOTE  NAME: Samuel Pennington MRN: [id number] DOB : Feb 26, 1966   Secure chat received from nurse reporting patient was refused by the floor due to hypotension.  At that time ~2330 patient blood pressure was 94/54 with a MAP of 67.  Patient in with a GI bleed and most recent hemoglobin at that time was 7.5. Fluid bolus ordered and repeat H&H ordered.  Repeat hemoglobin 7.1 and blood pressure has continued to downtrend, 83/59 with map of 68.  Will transfuse another 2 units of blood.   Neomia Glass MHA, MSN, FNP-BC Nurse Practitioner [facility] Pager [phone]

## 2021-12-20 ENCOUNTER — Other Ambulatory Visit: Payer: Self-pay

## 2021-12-20 ENCOUNTER — Ambulatory Visit: Payer: Self-pay | Admitting: Radiation Oncology

## 2021-12-20 ENCOUNTER — Inpatient Hospital Stay: Payer: Self-pay | Admitting: Oncology

## 2021-12-20 ENCOUNTER — Inpatient Hospital Stay: Payer: Self-pay

## 2021-12-20 LAB — TYPE AND SCREEN
ABO/RH(D): A POS
Antibody Screen: NEGATIVE
Unit division: 0
Unit division: 0
Unit division: 0
Unit division: 0

## 2021-12-20 LAB — GLUCOSE, CAPILLARY
Glucose-Capillary: 124 mg/dL — ABNORMAL HIGH (ref 70–99)
Glucose-Capillary: 132 mg/dL — ABNORMAL HIGH (ref 70–99)
Glucose-Capillary: 54 mg/dL — ABNORMAL LOW (ref 70–99)

## 2021-12-20 LAB — BPAM RBC
Blood Product Expiration Date: 202301292359
Blood Product Expiration Date: 202301302359
Blood Product Expiration Date: 202302032359
Blood Product Expiration Date: 202302032359
ISSUE DATE / TIME: 202301091355
ISSUE DATE / TIME: 202301091644
ISSUE DATE / TIME: 202301100309
ISSUE DATE / TIME: 202301100507
Unit Type and Rh: 6200
Unit Type and Rh: 6200
Unit Type and Rh: 6200
Unit Type and Rh: 6200

## 2021-12-20 LAB — CBC
HCT: 27.8 % — ABNORMAL LOW (ref 39.0–52.0)
Hemoglobin: 9.3 g/dL — ABNORMAL LOW (ref 13.0–17.0)
MCH: 28.6 pg (ref 26.0–34.0)
MCHC: 33.5 g/dL (ref 30.0–36.0)
MCV: 85.5 fL (ref 80.0–100.0)
Platelets: 261 10*3/uL (ref 150–400)
RBC: 3.25 MIL/uL — ABNORMAL LOW (ref 4.22–5.81)
RDW: 15.2 % (ref 11.5–15.5)
WBC: 5.2 10*3/uL (ref 4.0–10.5)
nRBC: 0 % (ref 0.0–0.2)

## 2021-12-20 MED ORDER — SUCRALFATE 1 GM/10ML PO SUSP: 1.0000 g | Freq: Three times a day (TID) | ORAL | 0 refills | Status: DC

## 2021-12-20 MED ORDER — PANTOPRAZOLE SODIUM 40 MG PO TBEC
40.0000 mg | DELAYED_RELEASE_TABLET | Freq: Every day | ORAL | 0 refills | Status: DC
Start: 2021-12-21 — End: 2022-03-15
  Filled 2021-12-20: qty 30, 30d supply, fill #0

## 2021-12-20 MED ORDER — OXYCODONE HCL 5 MG PO TABS
10.0000 mg | ORAL_TABLET | Freq: Four times a day (QID) | ORAL | Status: DC | PRN
  Administered 2021-12-20: 10 mg via ORAL
  Filled 2021-12-20: qty 2

## 2021-12-20 MED ORDER — SUCRALFATE 1 GM/10ML PO SUSP
1.0000 g | Freq: Three times a day (TID) | ORAL | 0 refills | Status: DC
  Filled 2021-12-20: qty 1200, 30d supply, fill #0

## 2021-12-20 MED ORDER — ADULT MULTIVITAMIN W/MINERALS CH
1.0000 | ORAL_TABLET | Freq: Every day | ORAL | 0 refills | Status: DC
  Filled 2021-12-20: qty 30, 30d supply, fill #0

## 2021-12-20 MED ORDER — ADULT MULTIVITAMIN W/MINERALS CH: 1.0000 | ORAL_TABLET | Freq: Every day | ORAL | Status: DC

## 2021-12-20 MED ORDER — GLUCERNA SHAKE PO LIQD: 237.0000 mL | Freq: Three times a day (TID) | ORAL | Status: DC

## 2021-12-20 NOTE — Discharge Instructions (Addendum)
1) Follow up with PCP 2) Follow up with Oncology Dr. Grayland Ormond tomorrow at [facility] 3) Take medications as prescribed

## 2021-12-20 NOTE — Discharge Summary (Signed)
Discharge Summary:   Samuel Pennington   ZGY:[id number] DOB: [birth year] DOA: 12/18/2021  PCP: Theotis Burrow, MD  Admit date: 12/18/2021 Discharge date: 12/20/2021  Admitted From: Home Disposition: [place]: Not applicable Equipment/Devices: Not applicable Discharge Condition: Improved and stable CODE STATUS: Full code Diet recommendation: Carb controlled   Hospital Course:   56 year old Hispanic male that is Spanish-speaking but does know some minimal [place].  This patient has a past medical history of non-insulin-dependent diabetes mellitus, hyperlipidemia, esophageal cancer.  This patient presented to the emergency department with black stools for approximately 1 week.  Reported weight loss over the past 6 months as well.  Initial hemoglobin was 5.6 with baseline hemoglobin in the eights.  Received 4 units of packed red blood cell fusion during this hospitalization.  Hemodynamics have improved and hemoglobin remained stable.  Gastroenterology was consulted and he has known ulcerated esophageal cancer which is the likely source of his anemia.  Started on Protonix and Carafate.  Had EUS with normal stomach and duodenum several days ago prior to presentation.  They considered hemospray versus IR intervention if brisk bleeding did develop but hemodynamics have stabilized.  He has an appointment with oncology tomorrow to start his cancer treatment.  He reported he had his first bowel movement since hospitalization today and had some mild bleeding in his stool that was bright red.  He was straining a lot to have this bowel movement.  Briefly discussed with Dr. Haig Prophet and he felt this was likely due to hemorrhoids.  No acute intervention was warranted.  The patient will be discharged with follow-up with primary care physician and as mentioned above oncology tomorrow to start his cancer treatment.  Strict return precautions given.   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 Grayland Ormond 3) Take medications as prescribed   Increase activity slowly   Complete by: As directed       Allergies as of 12/20/2021   No Known Allergies      Medication List     TAKE these medications    atorvastatin 40 MG tablet Commonly known as: LIPITOR TOMA UNA TABLETA POR BOCA AL ACOSTARSE PARA COLESTEROL ALTO   glipiZIDE 5 MG 24 hr tablet Commonly known as: GLUCOTROL XL TOME 1 TABLETA POR BOCA DIARIO PARA LA DIABETES   metFORMIN 1000 MG tablet Commonly known as: GLUCOPHAGE Take 1,000 mg by mouth daily.   multivitamin with minerals Tabs tablet Take 1 tablet by mouth daily.   oxyCODONE 5 MG immediate release tablet Commonly known as: Oxy IR/ROXICODONE Take 10 mg by mouth every 6 (six) hours as needed for pain.   pantoprazole 40 MG tablet Commonly known as: PROTONIX Take 1 tablet (40 mg total) by mouth daily. Start taking on: December 21, 2021   sucralfate 1 GM/10ML suspension Commonly known as: CARAFATE Take 10 mLs (1 g total) by mouth 4 (four) times daily -  with meals and at bedtime.        Follow-up Information     Revelo, Elyse Jarvis, MD. Call in 1 week(s).   Specialty: Family Medicine Contact information: [address] [address] [phone]         Lloyd Huger, MD. Go on 12/21/2021.   Specialty: Oncology Why: at [facility] information: [place]  [id number] [phone]                No Known Allergies    Consultations:   Gastroenterology   Procedures/Studies:   NM PET Image Initial (PI) Skull Base To Thigh  Result Date: 12/14/2021 CLINICAL DATA:  Initial treatment strategy for staging of esophageal adenocarcinoma. EXAM: NUCLEAR MEDICINE PET SKULL BASE TO THIGH TECHNIQUE: 7.7 mCi F-18 FDG was injected intravenously. Full-ring PET imaging was performed from the skull base to thigh after the radiotracer. CT data was obtained and used for attenuation correction and anatomic localization. Fasting blood glucose: 124 mg/dl COMPARISON:  Chest abdomen and pelvic CTs of 11/15/2021. FINDINGS: Mediastinal blood pool activity: SUV max 1.9 Liver activity: SUV max NA NECK: No areas of abnormal hypermetabolism. Incidental CT findings: No cervical adenopathy. CHEST: No pulmonary parenchymal or thoracic nodal hypermetabolism. Incidental CT findings: Deferred to recent diagnostic CT. Aortic and coronary artery calcification. Left hemidiaphragm elevation. Esophageal fluid level may represent a component of obstruction secondary to the below described mass. ABDOMEN/PELVIS: Hypermetabolism within the gastric cardia consistent with the site of primary. Example at 5.1 x 3.4 cm and a S.U.V. max of 13.1 on 127/3. No abdominopelvic nodal hypermetabolism. Incidental CT findings: Deferred to recent diagnostic CT. Abdominal aortic atherosclerosis. Mild prostatomegaly. SKELETON: No abnormal marrow activity. Incidental CT findings: none IMPRESSION: 1. Hypermetabolic GE junction mass, currently centered in the gastric cardia. 2. No evidence of hypermetabolic nodal or distant metastasis. 3. Incidental findings, including: Coronary artery atherosclerosis. Aortic Atherosclerosis (ICD10-I70.0). Prostatomegaly. Electronically Signed   By: Abigail Miyamoto M.D.   On: 12/14/2021 09:10   DG Chest Port 1 View  Result Date: 12/18/2021 CLINICAL DATA:  Black stools that started this  weekend. Recently diagnosed with esophageal adenocarcinoma. EXAM: PORTABLE CHEST 1 VIEW COMPARISON:  Radiographs 11/11/2021 and 02/13/2009. CT 11/15/2021. PET-CT 12/13/2021. FINDINGS: 1502 hours. The heart size and mediastinal contours are stable. There are lower lung volumes with mild resulting atelectasis at both lung bases. No edema, confluent airspace opacity, pleural effusion or pneumothorax. The bones appear unchanged. Telemetry leads overlie the chest. IMPRESSION: No evidence of acute cardiopulmonary process. Suboptimal inspiration with resulting mild bibasilar atelectasis. Electronically Signed   By: Richardean Sale M.D.   On: 12/18/2021 15:13       Discharge Exam:   Vitals:   12/20/21 0749 12/20/21 1232  BP: (!) 101/58 115/76  Pulse: 74 60  Resp: 18 18  Temp: 98.9 F (37.2 C) 97.9 F (36.6 C)  SpO2: 100% 100%   Vitals:   12/20/21 0037 12/20/21 0603 12/20/21 0749 12/20/21 1232  BP: (!) 94/56 (!) 94/56 (!) 101/58 115/76  Pulse: (!) 56 (!) 51 74 60  Resp: 16 16 18 18   Temp: 98 F (36.7 C) 98 F (36.7 C) 98.9 F (37.2 C) 97.9 F (36.6 C)  TempSrc: Oral Oral Oral Oral  SpO2: 100% 100% 100% 100%  Weight:      Height:        General: Pt is alert, awake, not in acute distress Cardiovascular: RRR, S1/S2 +, no rubs, no gallops  Respiratory: CTA bilaterally, no wheezing, no rhonchi Abdominal: Soft, NT, ND, bowel sounds + Extremities: no edema, no cyanosis    The results of significant diagnostics from this hospitalization (including imaging, microbiology, ancillary and laboratory) are listed below for reference.     Microbiology:   Recent Results (from the past 240 hour(s))  Resp Panel by RT-PCR (Flu A&B, Covid) Nasopharyngeal Swab     Status: None   Collection Time: 12/18/21 12:29 PM   Specimen: Nasopharyngeal Swab; Nasopharyngeal(NP) swabs in vial transport medium  Result Value Ref Range Status   SARS Coronavirus 2 by RT PCR NEGATIVE NEGATIVE Final    Comment:  (NOTE) SARS-CoV-2 target nucleic acids are NOT DETECTED.  The SARS-CoV-2 RNA is generally detectable in upper respiratory specimens during the acute phase of infection. The lowest concentration of SARS-CoV-2 viral copies this assay can detect is 138 copies/mL. A negative result does not preclude SARS-Cov-2 infection and should not be used as the sole basis for treatment or other patient management decisions. A negative result may occur with  improper specimen collection/handling, submission of specimen other than nasopharyngeal swab, presence of viral mutation(s) within the areas targeted by this assay, and inadequate number of viral copies(<138 copies/mL). A negative result must be combined with clinical observations, patient history, and epidemiological information. The expected result is Negative.  Fact Sheet for Patients:  EntrepreneurPulse.com.au  Fact Sheet for Healthcare Providers:  IncredibleEmployment.be  This test is no t yet approved or cleared by the [place] FDA and  has been authorized for detection and/or diagnosis of SARS-CoV-2 by FDA under an Emergency Use Authorization (EUA). This EUA will remain  in effect (meaning this test can be used) for the duration of the COVID-19 declaration under Section 564(b)(1) of the Act, 21 U.S.C.section 360bbb-3(b)(1), unless the authorization is terminated  or revoked sooner.       Influenza A by PCR NEGATIVE NEGATIVE Final   Influenza B by PCR NEGATIVE NEGATIVE Final    Comment: (NOTE) The Xpert Xpress SARS-CoV-2/FLU/RSV plus assay is intended as an aid in the diagnosis of influenza from Nasopharyngeal swab specimens and should not be used as a sole basis for treatment. Nasal washings and aspirates are unacceptable for Xpert Xpress SARS-CoV-2/FLU/RSV testing.  Fact Sheet for Patients: EntrepreneurPulse.com.au  Fact Sheet for Healthcare  Providers: IncredibleEmployment.be  This test is not yet approved or cleared by the [place] FDA and has been authorized for detection and/or diagnosis of SARS-CoV-2 by FDA under an Emergency Use Authorization (EUA). This EUA will remain in effect (meaning this test can be used) for the duration of the COVID-19 declaration under Section 564(b)(1) of the Act, 21 U.S.C. section 360bbb-3(b)(1), unless the authorization is terminated or revoked.  Performed at [facility], [address] [place], [place] [address]        Labs:   BNP (last 3 results) No results for input(s): BNP in the last 8760 hours. Basic Metabolic Panel: Recent Labs  Lab 12/18/21 0908 12/19/21 0512  NA 137 135  K 4.2 3.8  CL 102 106  CO2 26 22  GLUCOSE 188* 101*  BUN 15 9  CREATININE 0.83 0.53*  CALCIUM 8.4* 7.8*  MG  --  2.0  PHOS  --  4.3   Liver Function Tests: Recent Labs  Lab 12/18/21 0908 12/19/21 0512  AST 19 14*  ALT 11 9  ALKPHOS 57 43  BILITOT 0.5 0.6  PROT 6.8 5.4*  ALBUMIN 3.5 2.8*   No results for  input(s): LIPASE, AMYLASE in the last 168 hours. No results for input(s): AMMONIA in the last 168 hours. CBC: Recent Labs  Lab 12/18/21 0908 12/18/21 1623 12/18/21 1929 12/19/21 0030 12/19/21 0512 12/19/21 0801 12/19/21 1544 12/20/21 0523  WBC 7.3  --   --   --  6.2  --   --  5.2  HGB 5.6*   < > 7.5* 7.1* 8.6* 9.2* 9.9* 9.3*  HCT 18.8*   < > 22.4* 21.1* 27.1* 27.4*  --  27.8*  MCV 90.8  --   --   --  89.1  --   --  85.5  PLT 328  --   --   --  165  --   --  261   < > = values in this interval not displayed.   Cardiac Enzymes: No results for input(s): CKTOTAL, CKMB, CKMBINDEX, TROPONINI in the last 168 hours. BNP: Invalid input(s): POCBNP CBG: Recent Labs  Lab 12/19/21 1850 12/19/21 2124 12/20/21 0752 12/20/21 1230 12/20/21 1326  GLUCAP 113* 90 124* 54* 132*   D-Dimer No results for input(s): DDIMER in the last 72 hours. Hgb  A1c No results for input(s): HGBA1C in the last 72 hours. Lipid Profile No results for input(s): CHOL, HDL, LDLCALC, TRIG, CHOLHDL, LDLDIRECT in the last 72 hours. Thyroid function studies No results for input(s): TSH, T4TOTAL, T3FREE, THYROIDAB in the last 72 hours.  Invalid input(s): FREET3 Anemia work up No results for input(s): VITAMINB12, FOLATE, FERRITIN, TIBC, IRON, RETICCTPCT in the last 72 hours. Urinalysis No results found for: COLORURINE, APPEARANCEUR, [place], [place], GLUCOSEU, [place], [place], [place], PROTEINUR, UROBILINOGEN, NITRITE, LEUKOCYTESUR Sepsis Labs Invalid input(s): PROCALCITONIN,  WBC,  LACTICIDVEN Microbiology Recent Results (from the past 240 hour(s))  Resp Panel by RT-PCR (Flu A&B, Covid) Nasopharyngeal Swab     Status: None   Collection Time: 12/18/21 12:29 PM   Specimen: Nasopharyngeal Swab; Nasopharyngeal(NP) swabs in vial transport medium  Result Value Ref Range Status   SARS Coronavirus 2 by RT PCR NEGATIVE NEGATIVE Final    Comment: (NOTE) SARS-CoV-2 target nucleic acids are NOT DETECTED.  The SARS-CoV-2 RNA is generally detectable in upper respiratory specimens during the acute phase of infection. The lowest concentration of SARS-CoV-2 viral copies this assay can detect is 138 copies/mL. A negative result does not preclude SARS-Cov-2 infection and should not be used as the sole basis for treatment or other patient management decisions. A negative result may occur with  improper specimen collection/handling, submission of specimen other than nasopharyngeal swab, presence of viral mutation(s) within the areas targeted by this assay, and inadequate number of viral copies(<138 copies/mL). A negative result must be combined with clinical observations, patient history, and epidemiological information. The expected result is Negative.  Fact Sheet for Patients:  EntrepreneurPulse.com.au  Fact Sheet for Healthcare Providers:   IncredibleEmployment.be  This test is no t yet approved or cleared by the [place] FDA and  has been authorized for detection and/or diagnosis of SARS-CoV-2 by FDA under an Emergency Use Authorization (EUA). This EUA will remain  in effect (meaning this test can be used) for the duration of the COVID-19 declaration under Section 564(b)(1) of the Act, 21 U.S.C.section 360bbb-3(b)(1), unless the authorization is terminated  or revoked sooner.       Influenza A by PCR NEGATIVE NEGATIVE Final   Influenza B by PCR NEGATIVE NEGATIVE Final    Comment: (NOTE) The Xpert Xpress SARS-CoV-2/FLU/RSV plus assay is intended as an aid in the diagnosis of influenza from  Nasopharyngeal swab specimens and should not be used as a sole basis for treatment. Nasal washings and aspirates are unacceptable for Xpert Xpress SARS-CoV-2/FLU/RSV testing.  Fact Sheet for Patients: EntrepreneurPulse.com.au  Fact Sheet for Healthcare Providers: IncredibleEmployment.be  This test is not yet approved or cleared by the [place] FDA and has been authorized for detection and/or diagnosis of SARS-CoV-2 by FDA under an Emergency Use Authorization (EUA). This EUA will remain in effect (meaning this test can be used) for the duration of the COVID-19 declaration under Section 564(b)(1) of the Act, 21 U.S.C. section 360bbb-3(b)(1), unless the authorization is terminated or revoked.  Performed at [facility], [address] [place], [place] [address]      Time coordinating discharge: 45 minutes  SIGNED:   Leslee Home, MD  [facility] 12/20/2021, 3:24 PM Pager   If 7PM-7AM, please contact night-coverage [email or web address] Password [email or web address]

## 2021-12-20 NOTE — TOC CM/SW Note (Addendum)
Patient has orders to discharge home today. [facility] can fill the Protonix but not the multivitamin or Carafate. RN spoke with patient and he prefers those prescriptions be sent to the pharmacy at [facility]. MD is in secure chat. Med Mgmt will call CSW when Protonix is ready so it can be picked up.  Samuel Pennington, CSW [phone]  4:44 pm: Protonix delivered to the room. Patient said he has a ride home. No further concerns. CSW signing off.  Samuel Pennington, [place]

## 2021-12-20 NOTE — Plan of Care (Signed)

## 2021-12-20 NOTE — Progress Notes (Signed)
Initial Nutrition Assessment  DOCUMENTATION CODES:   Not applicable  INTERVENTION:   -Glucerna Shake po TID, each supplement provides 220 kcal and 10 grams of protein  -MVI with minerals daily -RD provided "Nutrition During and After Cancer Treatment" and "Head and Neck Cancer Nutrition Therapy" handout from [facility] Nutrition Care Manual in pt's preferred language of Spanish.  -Sent secure message to [place] RD for further follow-up  NUTRITION DIAGNOSIS:   Increased nutrient needs related to cancer and cancer related treatments as evidenced by estimated needs.  GOAL:   Patient will meet greater than or equal to 90% of their needs  MONITOR:   PO intake, Supplement acceptance, Labs, Weight trends, Skin, I & O's  REASON FOR ASSESSMENT:   Consult Diet education  ASSESSMENT:   Samuel Pennington is a 56 y.o. male with medical history significant for non-insulin-dependent diabetes mellitus, hyperlipidemia, squamous cell esophageal neoplasm, who presents to the emergency department for chief concerns of black stools for approximately 1 week.  Pt admitted with upper GI bleed.   Reviewed I/O's: +400 ml x 24 hours and +2.2 L since admission  Attempted to speak with pt x 2, however, unavailable at times of visits.   Pt currently on a regular diet. Noted meal completions 100%.   Per H&P, pt admits to a 15 pound weight loss over the past 6 months, however, no wt hx available to confirms this. Wt has been stable over the past month.   Pt with increased nutritional risk for malnutrition given esophageal cancer. RD provided "Nutrition During and After Cancer Treatment" and "Head and Neck Cancer Nutrition Therapy" handout from [facility] Nutrition Care Manual in pt's preferred language of Spanish. RD also alerted cancer center RD for further follow-up.   Lab Results  Component Value Date   HGBA1C 6.5 (H) 11/11/2021   PTA DM medications are 1000 mg metformin daily.   Labs reviewed:  CBGS: 54-171 (inpatient orders for glycemic control are 0-15 units insulin aspart daily TID with meals and 0-5 units insulin aspart daily at bedtime).    Diet Order:   Diet Order             Diet regular Room service appropriate? Yes; Fluid consistency: Thin  Diet effective now                   EDUCATION NEEDS:   Education needs have been addressed  Skin:  Skin Assessment: Reviewed RN Assessment  Last BM:  12/19/20  Height:   Ht Readings from Last 1 Encounters:  12/18/21 5\' 6"  (1.676 m)    Weight:   Wt Readings from Last 1 Encounters:  12/18/21 67 kg    Ideal Body Weight:  64.5 kg  BMI:  Body mass index is 23.84 kg/m.  Estimated Nutritional Needs:   Kcal:  2000-2200  Protein:  100-115 grams  Fluid:  > 2 L    Loistine Chance, RD, LDN, [place] Registered Dietitian II Certified Diabetes Care and Education Specialist Please refer to [facility] for RD and/or RD on-call/weekend/after hours pager

## 2021-12-21 ENCOUNTER — Encounter (INDEPENDENT_AMBULATORY_CARE_PROVIDER_SITE_OTHER): Payer: Self-pay

## 2021-12-21 ENCOUNTER — Encounter: Payer: Self-pay | Admitting: Radiation Oncology

## 2021-12-21 ENCOUNTER — Inpatient Hospital Stay: Payer: Self-pay | Attending: Oncology

## 2021-12-21 ENCOUNTER — Inpatient Hospital Stay (HOSPITAL_BASED_OUTPATIENT_CLINIC_OR_DEPARTMENT_OTHER): Payer: Self-pay | Admitting: Oncology

## 2021-12-21 ENCOUNTER — Ambulatory Visit
Admission: RE | Admit: 2021-12-21 | Discharge: 2021-12-21 | Disposition: A | Discharge status: Home / Self Care | Payer: Self-pay | Source: Ambulatory Visit | Attending: Radiation Oncology | Admitting: Radiation Oncology

## 2021-12-21 ENCOUNTER — Other Ambulatory Visit: Payer: Self-pay

## 2021-12-21 VITALS — BP 122/67 | HR 79 | Temp 98.0°F | Resp 16 | Wt 143.5 lb

## 2021-12-21 VITALS — BP 101/56 | HR 79 | Temp 98.6°F | Resp 20 | Ht 66.0 in | Wt 147.0 lb

## 2021-12-21 DIAGNOSIS — Z79899 Other long term (current) drug therapy: Secondary | ICD-10-CM | POA: Insufficient documentation

## 2021-12-21 DIAGNOSIS — C159 Malignant neoplasm of esophagus, unspecified: Secondary | ICD-10-CM

## 2021-12-21 DIAGNOSIS — E119 Type 2 diabetes mellitus without complications: Secondary | ICD-10-CM | POA: Insufficient documentation

## 2021-12-21 DIAGNOSIS — M549 Dorsalgia, unspecified: Secondary | ICD-10-CM | POA: Insufficient documentation

## 2021-12-21 DIAGNOSIS — D508 Other iron deficiency anemias: Secondary | ICD-10-CM

## 2021-12-21 DIAGNOSIS — Z7984 Long term (current) use of oral hypoglycemic drugs: Secondary | ICD-10-CM | POA: Insufficient documentation

## 2021-12-21 DIAGNOSIS — R5383 Other fatigue: Secondary | ICD-10-CM | POA: Insufficient documentation

## 2021-12-21 DIAGNOSIS — G8929 Other chronic pain: Secondary | ICD-10-CM | POA: Insufficient documentation

## 2021-12-21 DIAGNOSIS — K921 Melena: Secondary | ICD-10-CM | POA: Insufficient documentation

## 2021-12-21 DIAGNOSIS — Z510 Encounter for antineoplastic radiation therapy: Secondary | ICD-10-CM | POA: Insufficient documentation

## 2021-12-21 DIAGNOSIS — E785 Hyperlipidemia, unspecified: Secondary | ICD-10-CM | POA: Insufficient documentation

## 2021-12-21 DIAGNOSIS — D630 Anemia in neoplastic disease: Secondary | ICD-10-CM | POA: Insufficient documentation

## 2021-12-21 DIAGNOSIS — G709 Myoneural disorder, unspecified: Secondary | ICD-10-CM | POA: Insufficient documentation

## 2021-12-21 DIAGNOSIS — Z5111 Encounter for antineoplastic chemotherapy: Secondary | ICD-10-CM | POA: Insufficient documentation

## 2021-12-21 DIAGNOSIS — R531 Weakness: Secondary | ICD-10-CM | POA: Insufficient documentation

## 2021-12-21 LAB — CBC WITH DIFFERENTIAL/PLATELET
Abs Immature Granulocytes: 0.04 10*3/uL (ref 0.00–0.07)
Basophils Absolute: 0 10*3/uL (ref 0.0–0.1)
Basophils Relative: 0 %
Eosinophils Absolute: 0.1 10*3/uL (ref 0.0–0.5)
Eosinophils Relative: 1 %
HCT: 29.5 % — ABNORMAL LOW (ref 39.0–52.0)
Hemoglobin: 9.7 g/dL — ABNORMAL LOW (ref 13.0–17.0)
Immature Granulocytes: 0 %
Lymphocytes Relative: 10 %
Lymphs Abs: 1 10*3/uL (ref 0.7–4.0)
MCH: 29 pg (ref 26.0–34.0)
MCHC: 32.9 g/dL (ref 30.0–36.0)
MCV: 88.3 fL (ref 80.0–100.0)
Monocytes Absolute: 0.7 10*3/uL (ref 0.1–1.0)
Monocytes Relative: 7 %
Neutro Abs: 8.2 10*3/uL — ABNORMAL HIGH (ref 1.7–7.7)
Neutrophils Relative %: 82 %
Platelets: 298 10*3/uL (ref 150–400)
RBC: 3.34 MIL/uL — ABNORMAL LOW (ref 4.22–5.81)
RDW: 14.9 % (ref 11.5–15.5)
WBC: 10 10*3/uL (ref 4.0–10.5)
nRBC: 0 % (ref 0.0–0.2)

## 2021-12-21 LAB — COMPREHENSIVE METABOLIC PANEL
ALT: 13 U/L (ref 0–44)
AST: 15 U/L (ref 15–41)
Albumin: 3.5 g/dL (ref 3.5–5.0)
Alkaline Phosphatase: 60 U/L (ref 38–126)
Anion gap: 6 (ref 5–15)
BUN: 15 mg/dL (ref 6–20)
CO2: 26 mmol/L (ref 22–32)
Calcium: 8.3 mg/dL — ABNORMAL LOW (ref 8.9–10.3)
Chloride: 102 mmol/L (ref 98–111)
Creatinine, Ser: 0.81 mg/dL (ref 0.61–1.24)
GFR, Estimated: >60 mL/min (ref >60)
Glucose, Bld: 180 mg/dL — ABNORMAL HIGH (ref 70–99)
Potassium: 4 mmol/L (ref 3.5–5.1)
Sodium: 134 mmol/L — ABNORMAL LOW (ref 135–145)
Total Bilirubin: 0.6 mg/dL (ref 0.3–1.2)
Total Protein: 6.7 g/dL (ref 6.5–8.1)

## 2021-12-21 LAB — IRON AND TIBC
Iron: 26 ug/dL — ABNORMAL LOW (ref 45–182)
Saturation Ratios: 8 % — ABNORMAL LOW (ref 17.9–39.5)
TIBC: 325 ug/dL (ref 250–450)
UIBC: 299 ug/dL

## 2021-12-21 LAB — FERRITIN: Ferritin: 15 ng/mL — ABNORMAL LOW (ref 24–336)

## 2021-12-21 LAB — SAMPLE TO BLOOD BANK

## 2021-12-21 NOTE — Consult Note (Signed)
NEW PATIENT EVALUATION  Name: Samuel Pennington  MRN: [id number]  Date:   12/21/2021     DOB: [birth year]   This 56 y.o. 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   PREVIOUS INVESTIGATIONS:  PET scan and CT scans reviewed Pathology report reviewed Clinical notes reviewed  HPI: Patient is a 56 year old Spanish male accompanied by interpreter who presented over a month ago with GI bleed.  He initially presented with significant weakness fatigue and dark tarry stools.  He had a hemoglobin of 4.7.  He underwent upper endoscopy showing a 3.7 x 3.0 cm mass at the GE junction partially obstructing and partially circumferential involving two thirds of the lumen circumference.  On endoscopic sonography there was sonographic evidence of invasion into the serosa layer 5.  Biopsy was positive for poorly differentiated squamous cell carcinoma.  Initial CT scans showed distal esophageal junction mass without evidence of metastatic disease.  PET/CT is confirmed hypermetabolic GE junction mass currently centered in the gastric cardia.  Had no evidence of hypermetabolic nodal or distant metastatic disease.  He has been seen by medical oncology is now referred to radiation oncology for consideration of treatment.  He continues to have some dysphagia although states while he was hospitalized for transfusions he had very little to eat.  He is having no significant abdominal pain at this point.  PLANNED TREATMENT REGIMEN: Concurrent chemoradiation therapy with then possible resection  PAST MEDICAL HISTORY:  has a past medical history of Cancer ([place]), Diabetes mellitus without  complication ([place]), Hyperlipidemia, and Neuromuscular disorder ([place]).    PAST SURGICAL HISTORY:  Past Surgical History:  Procedure Laterality Date   COLONOSCOPY WITH PROPOFOL N/A 11/14/2021   Procedure: COLONOSCOPY WITH PROPOFOL;  Surgeon: Lesly Rubenstein, MD;  Location: [facility] ENDOSCOPY;  Service: Endoscopy;  Laterality: N/A;   ESOPHAGOGASTRODUODENOSCOPY (EGD) WITH PROPOFOL N/A 11/14/2021   Procedure: ESOPHAGOGASTRODUODENOSCOPY (EGD) WITH PROPOFOL;  Surgeon: Lesly Rubenstein, MD;  Location: [facility] ENDOSCOPY;  Service: Endoscopy;  Laterality: N/A;   EUS N/A 12/14/2021   Procedure: FULL UPPER ENDOSCOPIC ULTRASOUND (EUS) RADIAL;  Surgeon: Holly Bodily, MD;  Location: [facility] ENDOSCOPY;  Service: Gastroenterology;  Laterality: N/A;  SPANISH INTERPRETER    FAMILY HISTORY: family history includes Hypertension in his mother.  SOCIAL HISTORY:  reports that he has never smoked. He has never used smokeless tobacco. He reports current alcohol use. He reports that he does not use drugs.  ALLERGIES: Patient has no known allergies.  MEDICATIONS:  Current Outpatient Medications  Medication Sig Dispense Refill   atorvastatin (LIPITOR) 40 MG tablet TOMA UNA TABLETA POR BOCA AL ACOSTARSE PARA COLESTEROL ALTO 90 tablet 3   glipiZIDE (GLUCOTROL XL) 5 MG 24 hr tablet TOME 1 TABLETA POR BOCA DIARIO PARA LA DIABETES 90 tablet 1   metFORMIN (GLUCOPHAGE) 1000 MG tablet Take 1,000 mg by mouth daily.     oxyCODONE (OXY IR/ROXICODONE) 5 MG immediate release tablet Take 10 mg by mouth every 6 (six) hours as needed for pain.     pantoprazole (PROTONIX) 40 MG tablet Take 1 tablet (40 mg total) by mouth daily. 30 tablet 0   sucralfate (CARAFATE) 1 GM/10ML suspension Take 10 mLs (1 g total)  by mouth 4 (four) times daily -  with meals and at bedtime. 1200 mL 0   No current facility-administered medications for this encounter.    ECOG PERFORMANCE STATUS:  1 - Symptomatic but completely ambulatory  REVIEW OF  SYSTEMS: Patient denies any weight loss, fatigue, weakness, fever, chills or night sweats. Patient denies any loss of vision, blurred vision. Patient denies any ringing  of the ears or hearing loss. No irregular heartbeat. Patient denies heart murmur or history of fainting. Patient denies any chest pain or pain radiating to her upper extremities. Patient denies any shortness of breath, difficulty breathing at night, cough or hemoptysis. Patient denies any swelling in the lower legs. Patient denies any nausea vomiting, vomiting of blood, or coffee ground material in the vomitus. Patient denies any stomach pain. Patient states has had normal bowel movements no significant constipation or diarrhea. Patient denies any dysuria, hematuria or significant nocturia. Patient denies any problems walking, swelling in the joints or loss of balance. Patient denies any skin changes, loss of hair or loss of weight. Patient denies any excessive worrying or anxiety or significant depression. Patient denies any problems with insomnia. Patient denies excessive thirst, polyuria, polydipsia. Patient denies any swollen glands, patient denies easy bruising or easy bleeding. Patient denies any recent infections, allergies or URI. Patient "s visual fields have not changed significantly in recent time.   PHYSICAL EXAM: BP 122/67 (BP Location: Left Arm, Patient Position: Sitting)    Pulse 79    Temp 98 F (36.7 C) (Tympanic)    Resp 16    Wt 143 lb 8 oz (65.1 kg)    BMI 23.16 kg/m  Well-developed well-nourished patient in NAD. HEENT reveals PERLA, EOMI, discs not visualized.  Oral cavity is clear. No oral mucosal lesions are identified. Neck is clear without evidence of cervical or supraclavicular adenopathy. Lungs are clear to A&P. Cardiac examination is essentially unremarkable with regular rate and rhythm without murmur rub or thrill. Abdomen is benign with no organomegaly or masses noted. Motor sensory and DTR levels are equal and  symmetric in the upper and lower extremities. Cranial nerves II through XII are grossly intact. Proprioception is intact. No peripheral adenopathy or edema is identified. No motor or sensory levels are noted. Crude visual fields are within normal range.  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  Would plan on delivering 54 Gray in 30 fractions using IMRT treatment planning and delivery.  I would choose IMRT to spare critical structures such as small bowel: Spleen and cardiac structures.  Risks and benefits of treatment including possible increased dysphagia fatigue alteration of blood counts skin reaction or were reviewed in detail with the patient.  There will be extra effort by both professional staff as well as technical staff to coordinate and manage concurrent chemoradiation and ensuing side effects during his treatments.  Patient to interpreter comprehends my treatment plan well.  Based on the continued bleeding I am scheduling him for simulation today and will possibly start his treatments next week with coordination with medical oncology.  I would like to take this opportunity to thank you for allowing me to participate in the care of your patient.Marland Kitchen  Noreene Filbert, MD

## 2021-12-22 ENCOUNTER — Encounter: Payer: Self-pay | Admitting: Oncology

## 2021-12-22 ENCOUNTER — Other Ambulatory Visit: Payer: Self-pay

## 2021-12-22 MED ORDER — PROCHLORPERAZINE MALEATE 10 MG PO TABS
10.0000 mg | ORAL_TABLET | Freq: Four times a day (QID) | ORAL | 2 refills | Status: DC | PRN
  Filled 2021-12-22: qty 54, 14d supply, fill #0

## 2021-12-22 MED ORDER — LIDOCAINE-PRILOCAINE 2.5-2.5 % EX CREA
TOPICAL_CREAM | CUTANEOUS | 3 refills | Status: DC
  Filled 2021-12-22: qty 30, 30d supply, fill #0

## 2021-12-22 MED ORDER — ONDANSETRON HCL 4 MG PO TABS
8.0000 mg | ORAL_TABLET | Freq: Two times a day (BID) | ORAL | 1 refills | Status: DC | PRN
  Filled 2021-12-22: qty 120, 30d supply, fill #0

## 2021-12-22 NOTE — Progress Notes (Signed)
[place]  Telephone:([phone] Fax:(336) [phone]  ID: [name] OB: 1966/07/12  MR#: [id number]  QZR#:[id number]  Patient Care Team: Theotis Burrow, MD as PCP - General (Family Medicine) Lloyd Huger, MD as Consulting Physician (Oncology) Clent Jacks, RN as Oncology Nurse Navigator  CHIEF COMPLAINT: Stage III poorly differentiated squamous cell of the esophagus.  INTERVAL HISTORY: Patient returns to clinic today for further evaluation, discussion of his imaging and pathology results, and treatment planning.  He was recently discharged from the hospital after GI bleed.  He continues to have occasional difficulty swallowing, but denies any pain.  He otherwise feels well. He has no neurologic complaints.  He denies any recent fevers.  He has a fair appetite, but denies weight loss.  He has no chest pain, shortness of breath, cough, or hemoptysis.  He denies any nausea, vomiting, constipation, or diarrhea.  He has no further melena or hematochezia.  He has no urinary complaints.  Patient offers no further specific complaints today.  REVIEW OF SYSTEMS:   Review of Systems  Constitutional: Negative.  Negative for fever, malaise/fatigue and weight loss.  Respiratory: Negative.  Negative for cough, hemoptysis and shortness of breath.   Cardiovascular: Negative.  Negative for chest pain and leg swelling.  Gastrointestinal: Negative.  Negative for abdominal pain, blood in stool and melena.  Genitourinary: Negative.  Negative for dysuria.  Musculoskeletal: Negative.  Negative for back pain.  Skin: Negative.  Negative for rash.  Neurological: Negative.  Negative for dizziness, focal weakness, weakness and headaches.  Psychiatric/Behavioral: Negative.  The patient is not nervous/anxious.    As per HPI. Otherwise, a complete review of systems is negative.  PAST MEDICAL HISTORY: Past Medical History:  Diagnosis Date   Cancer ([place])    Diabetes  mellitus without complication ([place])    Hyperlipidemia    Neuromuscular disorder ([place])     PAST SURGICAL HISTORY: Past Surgical History:  Procedure Laterality Date   COLONOSCOPY WITH PROPOFOL N/A 11/14/2021   Procedure: COLONOSCOPY WITH PROPOFOL;  Surgeon: Lesly Rubenstein, MD;  Location: [facility] ENDOSCOPY;  Service: Endoscopy;  Laterality: N/A;   ESOPHAGOGASTRODUODENOSCOPY (EGD) WITH PROPOFOL N/A 11/14/2021   Procedure: ESOPHAGOGASTRODUODENOSCOPY (EGD) WITH PROPOFOL;  Surgeon: Lesly Rubenstein, MD;  Location: [facility] ENDOSCOPY;  Service: Endoscopy;  Laterality: N/A;   EUS N/A 12/14/2021   Procedure: FULL UPPER ENDOSCOPIC ULTRASOUND (EUS) RADIAL;  Surgeon: Holly Bodily, MD;  Location: [facility] ENDOSCOPY;  Service: Gastroenterology;  Laterality: N/A;  SPANISH INTERPRETER    FAMILY HISTORY: Family History  Problem Relation Age of Onset   Hypertension Mother     ADVANCED DIRECTIVES (Y/N):  N  HEALTH MAINTENANCE: Social History   Tobacco Use   Smoking status: Never   Smokeless tobacco: Never  Vaping Use   Vaping Use: Never used  Substance Use Topics   Alcohol use: Yes    Comment: occassionallh   Drug use: Never     Colonoscopy:  PAP:  Bone density:  Lipid panel:  No Known Allergies  Current Outpatient Medications  Medication Sig Dispense Refill   atorvastatin (LIPITOR) 40 MG tablet TOMA UNA TABLETA POR BOCA AL ACOSTARSE PARA COLESTEROL ALTO 90 tablet 3   glipiZIDE (GLUCOTROL XL) 5 MG 24 hr tablet TOME 1 TABLETA POR BOCA DIARIO PARA LA DIABETES 90 tablet 1   lidocaine-prilocaine (EMLA) cream Apply to affected area once 30 g 3   metFORMIN (GLUCOPHAGE) 1000 MG tablet Take 1,000 mg by mouth daily.  ondansetron (ZOFRAN) 8 MG tablet Take 1 tablet (8 mg total) by mouth 2 (two) times daily as needed for refractory nausea / vomiting. 60 tablet 1   oxyCODONE (OXY IR/ROXICODONE) 5 MG immediate release tablet Take 10 mg by mouth every 6 (six) hours as needed for pain.      pantoprazole (PROTONIX) 40 MG tablet Take 1 tablet (40 mg total) by mouth daily. 30 tablet 0   prochlorperazine (COMPAZINE) 10 MG tablet Take 1 tablet (10 mg total) by mouth every 6 (six) hours as needed (Nausea or vomiting). 60 tablet 2   sucralfate (CARAFATE) 1 GM/10ML suspension Take 10 mLs (1 g total) by mouth 4 (four) times daily -  with meals and at bedtime. 1200 mL 0   No current facility-administered medications for this visit.    OBJECTIVE: Vitals:   12/21/21 1004  BP: (!) 101/56  Pulse: 79  Resp: 20  Temp: 98.6 F (37 C)  SpO2: 100%      Body mass index is 23.73 kg/m.    ECOG FS:0 - Asymptomatic  General: Well-developed, well-nourished, no acute distress. Eyes: Pink conjunctiva, anicteric sclera. HEENT: Normocephalic, moist mucous membranes. Lungs: No audible wheezing or coughing. Heart: Regular rate and rhythm. Abdomen: Soft, nontender, no obvious distention. Musculoskeletal: No edema, cyanosis, or clubbing. Neuro: Alert, answering all questions appropriately. Cranial nerves grossly intact. Skin: No rashes or petechiae noted. Psych: Normal affect.    LAB RESULTS:  Lab Results  Component Value Date   NA 134 (L) 12/21/2021   K 4.0 12/21/2021   CL 102 12/21/2021   CO2 26 12/21/2021   GLUCOSE 180 (H) 12/21/2021   BUN 15 12/21/2021   CREATININE 0.81 12/21/2021   CALCIUM 8.3 (L) 12/21/2021   PROT 6.7 12/21/2021   ALBUMIN 3.5 12/21/2021   AST 15 12/21/2021   ALT 13 12/21/2021   ALKPHOS 60 12/21/2021   BILITOT 0.6 12/21/2021   GFRNONAA >60 12/21/2021    Lab Results  Component Value Date   WBC 10.0 12/21/2021   NEUTROABS 8.2 (H) 12/21/2021   HGB 9.7 (L) 12/21/2021   HCT 29.5 (L) 12/21/2021   MCV 88.3 12/21/2021   PLT 298 12/21/2021   Lab Results  Component Value Date   IRON 26 (L) 12/21/2021   TIBC 325 12/21/2021   IRONPCTSAT 8 (L) 12/21/2021   Lab Results  Component Value Date   FERRITIN 15 (L) 12/21/2021     STUDIES: NM PET Image Initial  (PI) Skull Base To Thigh  Result Date: 12/14/2021 CLINICAL DATA:  Initial treatment strategy for staging of esophageal adenocarcinoma. EXAM: NUCLEAR MEDICINE PET SKULL BASE TO THIGH TECHNIQUE: 7.7 mCi F-18 FDG was injected intravenously. Full-ring PET imaging was performed from the skull base to thigh after the radiotracer. CT data was obtained and used for attenuation correction and anatomic localization. Fasting blood glucose: 124 mg/dl COMPARISON:  Chest abdomen and pelvic CTs of 11/15/2021. FINDINGS: Mediastinal blood pool activity: SUV max 1.9 Liver activity: SUV max NA NECK: No areas of abnormal hypermetabolism. Incidental CT findings: No cervical adenopathy. CHEST: No pulmonary parenchymal or thoracic nodal hypermetabolism. Incidental CT findings: Deferred to recent diagnostic CT. Aortic and coronary artery calcification. Left hemidiaphragm elevation. Esophageal fluid level may represent a component of obstruction secondary to the below described mass. ABDOMEN/PELVIS: Hypermetabolism within the gastric cardia consistent with the site of primary. Example at 5.1 x 3.4 cm and a S.U.V. max of 13.1 on 127/3. No abdominopelvic nodal hypermetabolism. Incidental CT findings: Deferred to recent diagnostic  CT. Abdominal aortic atherosclerosis. Mild prostatomegaly. SKELETON: No abnormal marrow activity. Incidental CT findings: none IMPRESSION: 1. Hypermetabolic GE junction mass, currently centered in the gastric cardia. 2. No evidence of hypermetabolic nodal or distant metastasis. 3. Incidental findings, including: Coronary artery atherosclerosis. Aortic Atherosclerosis (ICD10-I70.0). Prostatomegaly. Electronically Signed   By: Abigail Miyamoto M.D.   On: 12/14/2021 09:10   DG Chest Port 1 View  Result Date: 12/18/2021 CLINICAL DATA:  Black stools that started this weekend. Recently diagnosed with esophageal adenocarcinoma. EXAM: PORTABLE CHEST 1 VIEW COMPARISON:  Radiographs 11/11/2021 and 02/13/2009. CT 11/15/2021.  PET-CT 12/13/2021. FINDINGS: 1502 hours. The heart size and mediastinal contours are stable. There are lower lung volumes with mild resulting atelectasis at both lung bases. No edema, confluent airspace opacity, pleural effusion or pneumothorax. The bones appear unchanged. Telemetry leads overlie the chest. IMPRESSION: No evidence of acute cardiopulmonary process. Suboptimal inspiration with resulting mild bibasilar atelectasis. Electronically Signed   By: Richardean Sale M.D.   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  PET scan results from December 13, 2021 reviewed independently and report as above with no obvious metastatic disease outside of patient's known esophageal mass.  EUS completed on December 14, 2021 confirmed stage.  Patient had consultation with radiation oncology today as well.  We discussed possible port placement, but patient declined.  He will benefit from concurrent chemotherapy and XRT using weekly carboplatinum and Taxol.  This will be followed by surgical resection, [facility] at [facility].  Return to clinic in 1 week for further evaluation and consideration of cycle 1 of weekly treatment.. 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  Patient's most recent hemoglobin is improved to 9.7.  He may benefit from IV iron in the future. Pain: Continue hydrocodone as needed. Appetite: Patient reports his weight is stable and he is able to tolerate p.o. intake.  Continue to monitor and if patient's weight begins to decline we will send a referral to dietary.  Patient expressed understanding and was in agreement with this plan. He also understands that He can call clinic at any time with any questions, concerns, or complaints.    Cancer Staging  Esophageal adenocarcinoma  [facility]) Staging form: Esophagus - Adenocarcinoma, AJCC 8th Edition - Clinical stage from 12/22/2021: Stage III (cT3, cN0, cM0) - Signed by Lloyd Huger, MD on 12/22/2021 Stage prefix: Initial diagnosis Total positive nodes: 0   Lloyd Huger, MD   12/22/2021 9:47 AM

## 2021-12-22 NOTE — Progress Notes (Signed)
START ON PATHWAY REGIMEN - Gastroesophageal     Administer weekly during RT:     Paclitaxel      Carboplatin   **Always confirm dose/schedule in your pharmacy ordering system**  Patient Characteristics: Esophageal & GE Junction, Adenocarcinoma, Preoperative or Nonsurgical Candidate (Clinical Staging), cT2 or Higher or cN+, Surgical Candidate (Up to cT4a) - Preoperative Therapy, Esophageal Histology: Adenocarcinoma Disease Classification: Esophageal Therapeutic Status: Preoperative or Nonsurgical Candidate (Clinical Staging) AJCC Grade: G3 AJCC 8 Stage Grouping: III AJCC T Category: cT3 AJCC N Category: cN0 AJCC M Category: cM0 Intent of Therapy: Curative Intent, Discussed with Patient

## 2021-12-26 NOTE — Progress Notes (Deleted)
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  INTERVAL HISTORY: Patient returns to clinic today for further evaluation, discussion of his imaging and pathology results, and treatment planning.  He was recently discharged from the hospital after GI bleed.  He continues to have occasional difficulty swallowing, but denies any pain.  He otherwise feels well. He has no neurologic complaints.  He denies any recent fevers.  He has a fair appetite, but denies weight loss.  He has no chest pain, shortness of breath, cough, or hemoptysis.  He denies any nausea, vomiting, constipation, or diarrhea.  He has no further melena or hematochezia.  He has no urinary complaints.  Patient offers no further specific complaints today.  REVIEW OF SYSTEMS:   Review of Systems  Constitutional: Negative.  Negative for fever, malaise/fatigue and weight loss.  Respiratory: Negative.  Negative for cough, hemoptysis and shortness of breath.   Cardiovascular: Negative.  Negative for chest pain and leg swelling.  Gastrointestinal: Negative.  Negative for abdominal pain, blood in stool and melena.  Genitourinary: Negative.  Negative for dysuria.  Musculoskeletal: Negative.  Negative for back pain.  Skin: Negative.  Negative for rash.  Neurological: Negative.  Negative for dizziness, focal weakness, weakness and headaches.  Psychiatric/Behavioral: Negative.  The patient is not nervous/anxious.    As per HPI. Otherwise, a complete review of systems is negative.  PAST MEDICAL HISTORY: Past Medical History:  Diagnosis Date   Cancer ([place])    Diabetes  mellitus without complication ([place])    Hyperlipidemia    Neuromuscular disorder ([place])     PAST SURGICAL HISTORY: Past Surgical History:  Procedure Laterality Date   COLONOSCOPY WITH PROPOFOL N/A 11/14/2021   Procedure: COLONOSCOPY WITH PROPOFOL;  Surgeon: Lesly Rubenstein, MD;  Location: [facility] ENDOSCOPY;  Service: Endoscopy;  Laterality: N/A;   ESOPHAGOGASTRODUODENOSCOPY (EGD) WITH PROPOFOL N/A 11/14/2021   Procedure: ESOPHAGOGASTRODUODENOSCOPY (EGD) WITH PROPOFOL;  Surgeon: Lesly Rubenstein, MD;  Location: [facility] ENDOSCOPY;  Service: Endoscopy;  Laterality: N/A;   EUS N/A 12/14/2021   Procedure: FULL UPPER ENDOSCOPIC ULTRASOUND (EUS) RADIAL;  Surgeon: Holly Bodily, MD;  Location: [facility] ENDOSCOPY;  Service: Gastroenterology;  Laterality: N/A;  SPANISH INTERPRETER    FAMILY HISTORY: Family History  Problem Relation Age of Onset   Hypertension Mother     ADVANCED DIRECTIVES (Y/N):  N  HEALTH MAINTENANCE: Social History   Tobacco Use   Smoking status: Never   Smokeless tobacco: Never  Vaping Use   Vaping Use: Never used  Substance Use Topics   Alcohol use: Yes    Comment: occassionallh   Drug use: Never     Colonoscopy:  PAP:  Bone density:  Lipid panel:  No Known Allergies  Current Outpatient Medications  Medication Sig Dispense Refill   atorvastatin (LIPITOR) 40 MG tablet TOMA UNA TABLETA POR BOCA AL ACOSTARSE PARA COLESTEROL ALTO 90 tablet 3   glipiZIDE (GLUCOTROL XL) 5 MG 24 hr tablet TOME 1 TABLETA POR BOCA DIARIO PARA LA DIABETES 90 tablet 1   lidocaine-prilocaine (EMLA) cream Apply to affected area once 30 g 3   metFORMIN (GLUCOPHAGE) 1000 MG tablet Take 1,000 mg by mouth daily.  ondansetron (ZOFRAN) 4 MG tablet Take 2 tablets (8 mg total) by mouth 2 (two) times daily as needed for refractory nausea / vomiting. 120 tablet 1   oxyCODONE (OXY IR/ROXICODONE) 5 MG immediate release tablet Take 10 mg by mouth every 6 (six) hours as needed for pain.      pantoprazole (PROTONIX) 40 MG tablet Take 1 tablet (40 mg total) by mouth daily. 30 tablet 0   prochlorperazine (COMPAZINE) 10 MG tablet Take 1 tablet (10 mg total) by mouth every 6 (six) hours as needed (Nausea or vomiting). 60 tablet 2   sucralfate (CARAFATE) 1 GM/10ML suspension Take 10 mLs (1 g total) by mouth 4 (four) times daily -  with meals and at bedtime. 1200 mL 0   No current facility-administered medications for this visit.    OBJECTIVE: There were no vitals filed for this visit.     There is no height or weight on file to calculate BMI.    ECOG FS:0 - Asymptomatic  General: Well-developed, well-nourished, no acute distress. Eyes: Pink conjunctiva, anicteric sclera. HEENT: Normocephalic, moist mucous membranes. Lungs: No audible wheezing or coughing. Heart: Regular rate and rhythm. Abdomen: Soft, nontender, no obvious distention. Musculoskeletal: No edema, cyanosis, or clubbing. Neuro: Alert, answering all questions appropriately. Cranial nerves grossly intact. Skin: No rashes or petechiae noted. Psych: Normal affect.    LAB RESULTS:  Lab Results  Component Value Date   NA 134 (L) 12/21/2021   K 4.0 12/21/2021   CL 102 12/21/2021   CO2 26 12/21/2021   GLUCOSE 180 (H) 12/21/2021   BUN 15 12/21/2021   CREATININE 0.81 12/21/2021   CALCIUM 8.3 (L) 12/21/2021   PROT 6.7 12/21/2021   ALBUMIN 3.5 12/21/2021   AST 15 12/21/2021   ALT 13 12/21/2021   ALKPHOS 60 12/21/2021   BILITOT 0.6 12/21/2021   GFRNONAA >60 12/21/2021    Lab Results  Component Value Date   WBC 10.0 12/21/2021   NEUTROABS 8.2 (H) 12/21/2021   HGB 9.7 (L) 12/21/2021   HCT 29.5 (L) 12/21/2021   MCV 88.3 12/21/2021   PLT 298 12/21/2021   Lab Results  Component Value Date   IRON 26 (L) 12/21/2021   TIBC 325 12/21/2021   IRONPCTSAT 8 (L) 12/21/2021   Lab Results  Component Value Date   FERRITIN 15 (L) 12/21/2021     STUDIES: NM PET Image Initial (PI) Skull Base To Thigh  Result  Date: 12/14/2021 CLINICAL DATA:  Initial treatment strategy for staging of esophageal adenocarcinoma. EXAM: NUCLEAR MEDICINE PET SKULL BASE TO THIGH TECHNIQUE: 7.7 mCi F-18 FDG was injected intravenously. Full-ring PET imaging was performed from the skull base to thigh after the radiotracer. CT data was obtained and used for attenuation correction and anatomic localization. Fasting blood glucose: 124 mg/dl COMPARISON:  Chest abdomen and pelvic CTs of 11/15/2021. FINDINGS: Mediastinal blood pool activity: SUV max 1.9 Liver activity: SUV max NA NECK: No areas of abnormal hypermetabolism. Incidental CT findings: No cervical adenopathy. CHEST: No pulmonary parenchymal or thoracic nodal hypermetabolism. Incidental CT findings: Deferred to recent diagnostic CT. Aortic and coronary artery calcification. Left hemidiaphragm elevation. Esophageal fluid level may represent a component of obstruction secondary to the below described mass. ABDOMEN/PELVIS: Hypermetabolism within the gastric cardia consistent with the site of primary. Example at 5.1 x 3.4 cm and a S.U.V. max of 13.1 on 127/3. No abdominopelvic nodal hypermetabolism. Incidental CT findings: Deferred to recent diagnostic CT. Abdominal aortic atherosclerosis. Mild prostatomegaly. SKELETON: No abnormal marrow activity. Incidental  CT findings: none IMPRESSION: 1. Hypermetabolic GE junction mass, currently centered in the gastric cardia. 2. No evidence of hypermetabolic nodal or distant metastasis. 3. Incidental findings, including: Coronary artery atherosclerosis. Aortic Atherosclerosis (ICD10-I70.0). Prostatomegaly. Electronically Signed   By: Abigail Miyamoto M.D.   On: 12/14/2021 09:10   DG Chest Port 1 View  Result Date: 12/18/2021 CLINICAL DATA:  Black stools that started this weekend. Recently diagnosed with esophageal adenocarcinoma. EXAM: PORTABLE CHEST 1 VIEW COMPARISON:  Radiographs 11/11/2021 and 02/13/2009. CT 11/15/2021. PET-CT 12/13/2021. FINDINGS: 1502  hours. The heart size and mediastinal contours are stable. There are lower lung volumes with mild resulting atelectasis at both lung bases. No edema, confluent airspace opacity, pleural effusion or pneumothorax. The bones appear unchanged. Telemetry leads overlie the chest. IMPRESSION: No evidence of acute cardiopulmonary process. Suboptimal inspiration with resulting mild bibasilar atelectasis. Electronically Signed   By: Richardean Sale M.D.   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  PET scan results from December 13, 2021 reviewed independently and report as above with no obvious metastatic disease outside of patient's known esophageal mass.  EUS completed on December 14, 2021 confirmed stage.  Patient had consultation with radiation oncology today as well.  We discussed possible port placement, but patient declined.  He will benefit from concurrent chemotherapy and XRT using weekly carboplatinum and Taxol.  This will be followed by surgical resection, likely at [facility].  Return to clinic in 1 week for further evaluation and consideration of cycle 1 of weekly treatment.. 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  Patient's most recent hemoglobin is improved to 9.7.  He may benefit from IV iron in the future. Pain: Continue hydrocodone as needed. Appetite: Patient reports his weight is stable and he is able to tolerate p.o. intake.  Continue to monitor and if patient's weight begins to decline we will send a referral to dietary.  Patient expressed understanding and was in agreement with this plan. He also understands that He can call clinic at any time with any questions, concerns, or complaints.    Cancer Staging  Esophageal adenocarcinoma [facility]) Staging form: Esophagus -  Adenocarcinoma, AJCC 8th Edition - Clinical stage from 12/22/2021: Stage III (cT3, cN0, cM0) - Signed by Lloyd Huger, MD on 12/22/2021 Stage prefix: Initial diagnosis Total positive nodes: 0   Lloyd Huger, MD   12/26/2021 6:08 AM

## 2021-12-27 ENCOUNTER — Inpatient Hospital Stay: Payer: Self-pay

## 2021-12-28 ENCOUNTER — Inpatient Hospital Stay: Payer: Self-pay

## 2021-12-28 ENCOUNTER — Inpatient Hospital Stay: Payer: Self-pay | Admitting: Oncology

## 2021-12-28 DIAGNOSIS — C159 Malignant neoplasm of esophagus, unspecified: Secondary | ICD-10-CM

## 2022-01-01 ENCOUNTER — Ambulatory Visit: Admission: RE | Admit: 2022-01-01 | Payer: Self-pay | Source: Ambulatory Visit

## 2022-01-02 ENCOUNTER — Encounter: Payer: Self-pay | Admitting: Oncology

## 2022-01-02 ENCOUNTER — Inpatient Hospital Stay (HOSPITAL_BASED_OUTPATIENT_CLINIC_OR_DEPARTMENT_OTHER): Payer: Self-pay | Admitting: Oncology

## 2022-01-02 ENCOUNTER — Inpatient Hospital Stay: Payer: Self-pay

## 2022-01-02 ENCOUNTER — Ambulatory Visit
Admission: RE | Admit: 2022-01-02 | Discharge: 2022-01-02 | Disposition: A | Discharge status: Home / Self Care | Payer: Self-pay | Source: Ambulatory Visit | Attending: Radiation Oncology | Admitting: Radiation Oncology

## 2022-01-02 ENCOUNTER — Other Ambulatory Visit: Payer: Self-pay

## 2022-01-02 VITALS — BP 101/51 | HR 73 | Temp 99.4°F | Resp 18

## 2022-01-02 VITALS — BP 103/67 | HR 68 | Temp 97.8°F | Wt 143.4 lb

## 2022-01-02 DIAGNOSIS — C159 Malignant neoplasm of esophagus, unspecified: Secondary | ICD-10-CM

## 2022-01-02 LAB — COMPREHENSIVE METABOLIC PANEL
ALT: 12 U/L (ref 0–44)
AST: 13 U/L — ABNORMAL LOW (ref 15–41)
Albumin: 4 g/dL (ref 3.5–5.0)
Alkaline Phosphatase: 56 U/L (ref 38–126)
Anion gap: 6 (ref 5–15)
BUN: 17 mg/dL (ref 6–20)
CO2: 26 mmol/L (ref 22–32)
Calcium: 8.6 mg/dL — ABNORMAL LOW (ref 8.9–10.3)
Chloride: 105 mmol/L (ref 98–111)
Creatinine, Ser: 0.57 mg/dL — ABNORMAL LOW (ref 0.61–1.24)
GFR, Estimated: >60 mL/min (ref >60)
Glucose, Bld: 88 mg/dL (ref 70–99)
Potassium: 3.8 mmol/L (ref 3.5–5.1)
Sodium: 137 mmol/L (ref 135–145)
Total Bilirubin: 0.4 mg/dL (ref 0.3–1.2)
Total Protein: 7.5 g/dL (ref 6.5–8.1)

## 2022-01-02 LAB — CBC WITH DIFFERENTIAL/PLATELET
Abs Immature Granulocytes: 0.02 10*3/uL (ref 0.00–0.07)
Basophils Absolute: 0.1 10*3/uL (ref 0.0–0.1)
Basophils Relative: 1 %
Eosinophils Absolute: 0.1 10*3/uL (ref 0.0–0.5)
Eosinophils Relative: 2 %
HCT: 33.4 % — ABNORMAL LOW (ref 39.0–52.0)
Hemoglobin: 10.4 g/dL — ABNORMAL LOW (ref 13.0–17.0)
Immature Granulocytes: 0 %
Lymphocytes Relative: 22 %
Lymphs Abs: 1.3 10*3/uL (ref 0.7–4.0)
MCH: 27.9 pg (ref 26.0–34.0)
MCHC: 31.1 g/dL (ref 30.0–36.0)
MCV: 89.5 fL (ref 80.0–100.0)
Monocytes Absolute: 0.4 10*3/uL (ref 0.1–1.0)
Monocytes Relative: 6 %
Neutro Abs: 4.3 10*3/uL (ref 1.7–7.7)
Neutrophils Relative %: 69 %
Platelets: 266 10*3/uL (ref 150–400)
RBC: 3.73 MIL/uL — ABNORMAL LOW (ref 4.22–5.81)
RDW: 14.9 % (ref 11.5–15.5)
WBC: 6.1 10*3/uL (ref 4.0–10.5)
nRBC: 0 % (ref 0.0–0.2)

## 2022-01-02 MED ORDER — SODIUM CHLORIDE 0.9 % IV SOLN
10.0000 mg | Freq: Once | INTRAVENOUS | Status: AC
  Administered 2022-01-02: 12:00:00 10 mg via INTRAVENOUS
  Filled 2022-01-02: qty 10

## 2022-01-02 MED ORDER — SODIUM CHLORIDE 0.9 % IV SOLN
242.2000 mg | Freq: Once | INTRAVENOUS | Status: AC
  Administered 2022-01-02: 15:00:00 240 mg via INTRAVENOUS
  Filled 2022-01-02: qty 24

## 2022-01-02 MED ORDER — IRON SUCROSE 20 MG/ML IV SOLN
200.0000 mg | Freq: Once | INTRAVENOUS | Status: AC
  Administered 2022-01-02: 200 mg via INTRAVENOUS
  Filled 2022-01-02: qty 10

## 2022-01-02 MED ORDER — FAMOTIDINE IN NACL 20-0.9 MG/50ML-% IV SOLN
20.0000 mg | Freq: Once | INTRAVENOUS | Status: AC
  Administered 2022-01-02: 11:00:00 20 mg via INTRAVENOUS
  Filled 2022-01-02: qty 50

## 2022-01-02 MED ORDER — HEPARIN SOD (PORK) LOCK FLUSH 100 UNIT/ML IV SOLN
INTRAVENOUS | Status: AC
  Filled 2022-01-02: qty 5

## 2022-01-02 MED ORDER — PALONOSETRON HCL INJECTION 0.25 MG/5ML
0.2500 mg | Freq: Once | INTRAVENOUS | Status: AC
  Administered 2022-01-02: 11:00:00 0.25 mg via INTRAVENOUS
  Filled 2022-01-02: qty 5

## 2022-01-02 MED ORDER — SODIUM CHLORIDE 0.9 % IV SOLN: 200.0000 mg | Freq: Once | INTRAVENOUS | Status: DC

## 2022-01-02 MED ORDER — DIPHENHYDRAMINE HCL 50 MG/ML IJ SOLN
25.0000 mg | Freq: Once | INTRAMUSCULAR | Status: AC
  Administered 2022-01-02: 11:00:00 25 mg via INTRAVENOUS
  Filled 2022-01-02: qty 1

## 2022-01-02 MED ORDER — SODIUM CHLORIDE 0.9 % IV SOLN
Freq: Once | INTRAVENOUS | Status: AC
  Filled 2022-01-02: qty 250

## 2022-01-02 MED ORDER — SODIUM CHLORIDE 0.9 % IV SOLN
50.0000 mg/m2 | Freq: Once | INTRAVENOUS | Status: AC
  Administered 2022-01-02: 13:00:00 90 mg via INTRAVENOUS
  Filled 2022-01-02: qty 15

## 2022-01-02 NOTE — Progress Notes (Signed)
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  INTERVAL HISTORY: Patient returns to clinic today for further evaluation and initiation of cycle 1 of weekly carboplatinum and Taxol.  He initiated daily XRT yesterday.  He is anxious, but otherwise feels well.  He has no further GI bleeds.  He continues to have occasional difficulty swallowing, but denies any pain.  He has no neurologic complaints.  He denies any recent fevers.  He has a fair appetite, but denies weight loss.  He has no chest pain, shortness of breath, cough, or hemoptysis.  He denies any nausea, vomiting, constipation, or diarrhea.  He has no further melena or hematochezia.  He has no urinary complaints.  Patient offers no further specific complaints today.  REVIEW OF SYSTEMS:   Review of Systems  Constitutional: Negative.  Negative for fever, malaise/fatigue and weight loss.  Respiratory: Negative.  Negative for cough, hemoptysis and shortness of breath.   Cardiovascular: Negative.  Negative for chest pain and leg swelling.  Gastrointestinal: Negative.  Negative for abdominal pain, blood in stool and melena.  Genitourinary: Negative.  Negative for dysuria.  Musculoskeletal: Negative.  Negative for back pain.  Skin: Negative.  Negative for rash.  Neurological: Negative.  Negative for dizziness, focal weakness, weakness and headaches.  Psychiatric/Behavioral:  The patient is nervous/anxious.    As per HPI. Otherwise, a complete review of systems is negative.  PAST MEDICAL HISTORY: Past Medical History:  Diagnosis Date   Cancer ([place])    Diabetes  mellitus without complication ([place])    Hyperlipidemia    Neuromuscular disorder ([place])     PAST SURGICAL HISTORY: Past Surgical History:  Procedure Laterality Date   COLONOSCOPY WITH PROPOFOL N/A 11/14/2021   Procedure: COLONOSCOPY WITH PROPOFOL;  Surgeon: Lesly Rubenstein, MD;  Location: [facility] ENDOSCOPY;  Service: Endoscopy;  Laterality: N/A;   ESOPHAGOGASTRODUODENOSCOPY (EGD) WITH PROPOFOL N/A 11/14/2021   Procedure: ESOPHAGOGASTRODUODENOSCOPY (EGD) WITH PROPOFOL;  Surgeon: Lesly Rubenstein, MD;  Location: [facility] ENDOSCOPY;  Service: Endoscopy;  Laterality: N/A;   EUS N/A 12/14/2021   Procedure: FULL UPPER ENDOSCOPIC ULTRASOUND (EUS) RADIAL;  Surgeon: Holly Bodily, MD;  Location: [facility] ENDOSCOPY;  Service: Gastroenterology;  Laterality: N/A;  SPANISH INTERPRETER    FAMILY HISTORY: Family History  Problem Relation Age of Onset   Hypertension Mother     ADVANCED DIRECTIVES (Y/N):  N  HEALTH MAINTENANCE: Social History   Tobacco Use   Smoking status: Never   Smokeless tobacco: Never  Vaping Use   Vaping Use: Never used  Substance Use Topics   Alcohol use: Yes    Comment: occassionallh   Drug use: Never     Colonoscopy:  PAP:  Bone density:  Lipid panel:  No Known Allergies  Current Outpatient Medications  Medication Sig Dispense Refill   lidocaine-prilocaine (EMLA) cream Apply to affected area once 30 g 3   metFORMIN (GLUCOPHAGE) 1000 MG tablet Take 1,000 mg by mouth daily.     ondansetron (ZOFRAN) 4 MG tablet Take 2 tablets (8 mg total) by mouth 2 (two) times daily as needed for refractory nausea / vomiting. 120 tablet 1   oxyCODONE (OXY IR/ROXICODONE) 5 MG immediate release tablet  Take 10 mg by mouth every 6 (six) hours as needed for pain.     pantoprazole (PROTONIX) 40 MG tablet Take 1 tablet (40 mg total) by mouth daily. 30 tablet 0   prochlorperazine (COMPAZINE) 10 MG tablet Take 1 tablet (10 mg total) by mouth every 6 (six) hours as needed (Nausea or  vomiting). 60 tablet 2   sucralfate (CARAFATE) 1 GM/10ML suspension Take 10 mLs (1 g total) by mouth 4 (four) times daily -  with meals and at bedtime. 1200 mL 0   atorvastatin (LIPITOR) 40 MG tablet TOMA UNA TABLETA POR BOCA AL ACOSTARSE PARA COLESTEROL ALTO 90 tablet 3   glipiZIDE (GLUCOTROL XL) 5 MG 24 hr tablet TOME 1 TABLETA POR BOCA DIARIO PARA LA DIABETES 90 tablet 1   No current facility-administered medications for this visit.   Facility-Administered Medications Ordered in Other Visits  Medication Dose Route Frequency Provider Last Rate Last Admin   0.9 %  sodium chloride infusion   Intravenous Once Grayland Ormond, Kathlene November, MD       CARBOplatin (PARAPLATIN) 240 mg in sodium chloride 0.9 % 250 mL chemo infusion  240 mg Intravenous Once Lloyd Huger, MD       dexamethasone (DECADRON) 10 mg in sodium chloride 0.9 % 50 mL IVPB  10 mg Intravenous Once Lloyd Huger, MD       diphenhydrAMINE (BENADRYL) injection 25 mg  25 mg Intravenous Once Lloyd Huger, MD       famotidine (PEPCID) IVPB 20 mg premix  20 mg Intravenous Once Lloyd Huger, MD       iron sucrose (VENOFER) injection 200 mg  200 mg Intravenous Once Lloyd Huger, MD       PACLitaxel (TAXOL) 90 mg in sodium chloride 0.9 % 250 mL chemo infusion (</= 80mg /m2)  50 mg/m2 (Treatment Plan Recorded) Intravenous Once Lloyd Huger, MD       palonosetron (ALOXI) injection 0.25 mg  0.25 mg Intravenous Once Lloyd Huger, MD        OBJECTIVE: Vitals:   01/02/22 1000  BP: 103/67  Pulse: 68  Temp: 97.8 F (36.6 C)       Body mass index is 23.15 kg/m.    ECOG FS:0 - Asymptomatic  General: Well-developed, well-nourished, no acute distress. Eyes: Pink conjunctiva, anicteric sclera. HEENT: Normocephalic, moist mucous membranes. Lungs: No audible wheezing or coughing. Heart: Regular rate and rhythm. Abdomen: Soft, nontender, no obvious distention. Musculoskeletal: No edema, cyanosis, or  clubbing. Neuro: Alert, answering all questions appropriately. Cranial nerves grossly intact. Skin: No rashes or petechiae noted. Psych: Normal affect.   LAB RESULTS:  Lab Results  Component Value Date   NA 137 01/02/2022   K 3.8 01/02/2022   CL 105 01/02/2022   CO2 26 01/02/2022   GLUCOSE 88 01/02/2022   BUN 17 01/02/2022   CREATININE 0.57 (L) 01/02/2022   CALCIUM 8.6 (L) 01/02/2022   PROT 7.5 01/02/2022   ALBUMIN 4.0 01/02/2022   AST 13 (L) 01/02/2022   ALT 12 01/02/2022   ALKPHOS 56 01/02/2022   BILITOT 0.4 01/02/2022   GFRNONAA >60 01/02/2022    Lab Results  Component Value Date   WBC 6.1 01/02/2022   NEUTROABS 4.3 01/02/2022   HGB 10.4 (L) 01/02/2022   HCT 33.4 (L) 01/02/2022   MCV 89.5 01/02/2022   PLT 266 01/02/2022   Lab Results  Component Value Date   IRON 26 (L) 12/21/2021   TIBC 325 12/21/2021  IRONPCTSAT 8 (L) 12/21/2021   Lab Results  Component Value Date   FERRITIN 15 (L) 12/21/2021     STUDIES: NM PET Image Initial (PI) Skull Base To Thigh  Result Date: 12/14/2021 CLINICAL DATA:  Initial treatment strategy for staging of esophageal adenocarcinoma. EXAM: NUCLEAR MEDICINE PET SKULL BASE TO THIGH TECHNIQUE: 7.7 mCi F-18 FDG was injected intravenously. Full-ring PET imaging was performed from the skull base to thigh after the radiotracer. CT data was obtained and used for attenuation correction and anatomic localization. Fasting blood glucose: 124 mg/dl COMPARISON:  Chest abdomen and pelvic CTs of 11/15/2021. FINDINGS: Mediastinal blood pool activity: SUV max 1.9 Liver activity: SUV max NA NECK: No areas of abnormal hypermetabolism. Incidental CT findings: No cervical adenopathy. CHEST: No pulmonary parenchymal or thoracic nodal hypermetabolism. Incidental CT findings: Deferred to recent diagnostic CT. Aortic and coronary artery calcification. Left hemidiaphragm elevation. Esophageal fluid level may represent a component of obstruction secondary to the  below described mass. ABDOMEN/PELVIS: Hypermetabolism within the gastric cardia consistent with the site of primary. Example at 5.1 x 3.4 cm and a S.U.V. max of 13.1 on 127/3. No abdominopelvic nodal hypermetabolism. Incidental CT findings: Deferred to recent diagnostic CT. Abdominal aortic atherosclerosis. Mild prostatomegaly. SKELETON: No abnormal marrow activity. Incidental CT findings: none IMPRESSION: 1. Hypermetabolic GE junction mass, currently centered in the gastric cardia. 2. No evidence of hypermetabolic nodal or distant metastasis. 3. Incidental findings, including: Coronary artery atherosclerosis. Aortic Atherosclerosis (ICD10-I70.0). Prostatomegaly. Electronically Signed   By: Abigail Miyamoto M.D.   On: 12/14/2021 09:10   DG Chest Port 1 View  Result Date: 12/18/2021 CLINICAL DATA:  Black stools that started this weekend. Recently diagnosed with esophageal adenocarcinoma. EXAM: PORTABLE CHEST 1 VIEW COMPARISON:  Radiographs 11/11/2021 and 02/13/2009. CT 11/15/2021. PET-CT 12/13/2021. FINDINGS: 1502 hours. The heart size and mediastinal contours are stable. There are lower lung volumes with mild resulting atelectasis at both lung bases. No edema, confluent airspace opacity, pleural effusion or pneumothorax. The bones appear unchanged. Telemetry leads overlie the chest. IMPRESSION: No evidence of acute cardiopulmonary process. Suboptimal inspiration with resulting mild bibasilar atelectasis. Electronically Signed   By: Richardean Sale M.D.   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  PET scan results from December 13, 2021 reviewed independently and report as above with no obvious metastatic disease outside of patient's known esophageal mass.  EUS completed on December 14, 2021 confirmed stage.  Patient had  consultation with radiation oncology today as well.  We discussed possible port placement, but patient declined.  He will benefit from concurrent chemotherapy and XRT using weekly carboplatinum and Taxol.  This will be followed by surgical resection, likely at [facility].  Proceed with cycle 1 of weekly carboplatin and Taxol.  Continue daily XRT.  Return to clinic in 1 week for further evaluation and consideration of cycle 2.   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  Patient's most recent hemoglobin is improved to 10.4.  Proceed with IV Venofer today.  Patient will receive a second dose with treatment next week.  Pain: Continue hydrocodone as needed. Appetite: Patient reports his weight is stable and he is able to tolerate p.o. intake.  Continue to monitor and if patient's weight begins to decline we will send a referral to dietary.  Patient expressed understanding and was in  agreement with this plan. He also understands that He can call clinic at any time with any questions, concerns, or complaints.    Cancer Staging  Esophageal adenocarcinoma [facility]) Staging form: Esophagus - Adenocarcinoma, AJCC 8th Edition - Clinical stage from 12/22/2021: Stage III (cT3, cN0, cM0) - Signed by Lloyd Huger, MD on 12/22/2021 Stage prefix: Initial diagnosis Total positive nodes: 0   Lloyd Huger, MD   01/02/2022 11:11 AM

## 2022-01-02 NOTE — Progress Notes (Signed)
Pt is very nervous about his tx plan and what is to come.

## 2022-01-02 NOTE — Patient Instructions (Signed)
[facility]  Discharge Instructions: Thank you for choosing [facility] to provide your oncology and hematology care.  If you have a lab appointment with the Cancer Center, please go directly to the Cancer Center and check in at the registration area.  Wear comfortable clothing and clothing appropriate for easy access to any Portacath or PICC line.   We strive to give you quality time with your provider. You may need to reschedule your appointment if you arrive late (15 or more minutes).  Arriving late affects you and other patients whose appointments are after yours.  Also, if you miss three or more appointments without notifying the office, you may be dismissed from the clinic at the provider's discretion.      For prescription refill requests, have your pharmacy contact our office and allow 72 hours for refills to be completed.    Today you received the following chemotherapy and/or immunotherapy agents Taxol, Carboplatin    To help prevent nausea and vomiting after your treatment, we encourage you to take your nausea medication as directed.  BELOW ARE SYMPTOMS THAT SHOULD BE REPORTED IMMEDIATELY: *FEVER GREATER THAN 100.4 F (38 C) OR HIGHER *CHILLS OR SWEATING *NAUSEA AND VOMITING THAT IS NOT CONTROLLED WITH YOUR NAUSEA MEDICATION *UNUSUAL SHORTNESS OF BREATH *UNUSUAL BRUISING OR BLEEDING *URINARY PROBLEMS (pain or burning when urinating, or frequent urination) *BOWEL PROBLEMS (unusual diarrhea, constipation, pain near the anus) TENDERNESS IN MOUTH AND THROAT WITH OR WITHOUT PRESENCE OF ULCERS (sore throat, sores in mouth, or a toothache) UNUSUAL RASH, SWELLING OR PAIN  UNUSUAL VAGINAL DISCHARGE OR ITCHING   Items with * indicate a potential emergency and should be followed up as soon as possible or go to the Emergency Department if any problems should occur.  Please show the CHEMOTHERAPY ALERT CARD or IMMUNOTHERAPY ALERT CARD at  check-in to the Emergency Department and triage nurse.  Should you have questions after your visit or need to cancel or reschedule your appointment, please contact [facility]  [phone] and follow the prompts.  Office hours are 8:00 a.m. to 4:30 p.m. Monday - Friday. Please note that voicemails left after 4:00 p.m. may not be returned until the following business day.  We are closed weekends and major holidays. You have access to a nurse at all times for urgent questions. Please call the main number to the clinic [phone] and follow the prompts.  For any non-urgent questions, you may also contact your provider using MyChart. We now offer e-Visits for anyone 18 and older to request care online for non-urgent symptoms. For details visit [email or web address].   Also download the MyChart app! Go to the app store, search "MyChart", open the app, select [place], and log in with your MyChart username and password.  Due to Covid, a mask is required upon entering the hospital/clinic. If you do not have a mask, one will be given to you upon arrival. For doctor visits, patients may have 1 support person aged 18 or older with them. For treatment visits, patients cannot have anyone with them due to current Covid guidelines and our immunocompromised population.  

## 2022-01-02 NOTE — Progress Notes (Signed)
Patient is receiving Replacement Medication. Medication: Venofer (Iron Sucrose) Manufacture: [employer]  Approval Dates: Approved from 01/02/2022 until 01/02/2023. ID: [id number] Reason: Self First DOS: 01/02/2022   Madalyn Rob, CPhT IV Drug Replacement Specialist  [place] Phone: [phone]

## 2022-01-02 NOTE — Progress Notes (Signed)
Pt received IV venofer and first time taxol/carbo infusion in clinic today. Tolerated well. No complaints @ d/c.

## 2022-01-03 ENCOUNTER — Inpatient Hospital Stay: Payer: Self-pay

## 2022-01-03 ENCOUNTER — Ambulatory Visit
Admission: RE | Admit: 2022-01-03 | Discharge: 2022-01-03 | Disposition: A | Discharge status: Home / Self Care | Payer: Self-pay | Source: Ambulatory Visit | Attending: Radiation Oncology | Admitting: Radiation Oncology

## 2022-01-04 ENCOUNTER — Ambulatory Visit
Admission: RE | Admit: 2022-01-04 | Discharge: 2022-01-04 | Disposition: A | Discharge status: Home / Self Care | Payer: Self-pay | Source: Ambulatory Visit | Attending: Radiation Oncology | Admitting: Radiation Oncology

## 2022-01-04 ENCOUNTER — Other Ambulatory Visit: Payer: Self-pay | Admitting: *Deleted

## 2022-01-04 ENCOUNTER — Other Ambulatory Visit: Payer: Self-pay

## 2022-01-05 ENCOUNTER — Ambulatory Visit
Admission: RE | Admit: 2022-01-05 | Discharge: 2022-01-05 | Disposition: A | Discharge status: Home / Self Care | Payer: Self-pay | Source: Ambulatory Visit | Attending: Radiation Oncology | Admitting: Radiation Oncology

## 2022-01-08 ENCOUNTER — Ambulatory Visit
Admission: RE | Admit: 2022-01-08 | Discharge: 2022-01-08 | Disposition: A | Discharge status: Home / Self Care | Payer: Self-pay | Source: Ambulatory Visit | Attending: Radiation Oncology | Admitting: Radiation Oncology

## 2022-01-09 ENCOUNTER — Inpatient Hospital Stay: Payer: Self-pay

## 2022-01-09 ENCOUNTER — Ambulatory Visit
Admission: RE | Admit: 2022-01-09 | Discharge: 2022-01-09 | Disposition: A | Discharge status: Home / Self Care | Payer: Self-pay | Source: Ambulatory Visit | Attending: Radiation Oncology | Admitting: Radiation Oncology

## 2022-01-09 ENCOUNTER — Other Ambulatory Visit: Payer: Self-pay

## 2022-01-09 ENCOUNTER — Encounter: Payer: Self-pay | Admitting: Oncology

## 2022-01-09 ENCOUNTER — Inpatient Hospital Stay (HOSPITAL_BASED_OUTPATIENT_CLINIC_OR_DEPARTMENT_OTHER): Payer: Self-pay | Admitting: Oncology

## 2022-01-09 VITALS — BP 120/75 | HR 69 | Temp 96.7°F | Wt 147.0 lb

## 2022-01-09 DIAGNOSIS — C159 Malignant neoplasm of esophagus, unspecified: Secondary | ICD-10-CM

## 2022-01-09 LAB — CBC WITH DIFFERENTIAL/PLATELET
Abs Immature Granulocytes: 0.04 10*3/uL (ref 0.00–0.07)
Basophils Absolute: 0 10*3/uL (ref 0.0–0.1)
Basophils Relative: 1 %
Eosinophils Absolute: 0.2 10*3/uL (ref 0.0–0.5)
Eosinophils Relative: 3 %
HCT: 33.7 % — ABNORMAL LOW (ref 39.0–52.0)
Hemoglobin: 10.5 g/dL — ABNORMAL LOW (ref 13.0–17.0)
Immature Granulocytes: 1 %
Lymphocytes Relative: 17 %
Lymphs Abs: 0.8 10*3/uL (ref 0.7–4.0)
MCH: 27.8 pg (ref 26.0–34.0)
MCHC: 31.2 g/dL (ref 30.0–36.0)
MCV: 89.2 fL (ref 80.0–100.0)
Monocytes Absolute: 0.3 10*3/uL (ref 0.1–1.0)
Monocytes Relative: 7 %
Neutro Abs: 3.6 10*3/uL (ref 1.7–7.7)
Neutrophils Relative %: 71 %
Platelets: 238 10*3/uL (ref 150–400)
RBC: 3.78 MIL/uL — ABNORMAL LOW (ref 4.22–5.81)
RDW: 15.5 % (ref 11.5–15.5)
WBC: 5 10*3/uL (ref 4.0–10.5)
nRBC: 0 % (ref 0.0–0.2)

## 2022-01-09 LAB — COMPREHENSIVE METABOLIC PANEL
ALT: 13 U/L (ref 0–44)
AST: 14 U/L — ABNORMAL LOW (ref 15–41)
Albumin: 3.9 g/dL (ref 3.5–5.0)
Alkaline Phosphatase: 56 U/L (ref 38–126)
Anion gap: 7 (ref 5–15)
BUN: 16 mg/dL (ref 6–20)
CO2: 28 mmol/L (ref 22–32)
Calcium: 9.2 mg/dL (ref 8.9–10.3)
Chloride: 104 mmol/L (ref 98–111)
Creatinine, Ser: 0.83 mg/dL (ref 0.61–1.24)
GFR, Estimated: >60 mL/min (ref >60)
Glucose, Bld: 99 mg/dL (ref 70–99)
Potassium: 4.1 mmol/L (ref 3.5–5.1)
Sodium: 139 mmol/L (ref 135–145)
Total Bilirubin: 0.1 mg/dL — ABNORMAL LOW (ref 0.3–1.2)
Total Protein: 7.3 g/dL (ref 6.5–8.1)

## 2022-01-09 MED ORDER — FAMOTIDINE IN NACL 20-0.9 MG/50ML-% IV SOLN
20.0000 mg | Freq: Once | INTRAVENOUS | Status: AC
  Administered 2022-01-09: 20 mg via INTRAVENOUS
  Filled 2022-01-09: qty 50

## 2022-01-09 MED ORDER — SODIUM CHLORIDE 0.9 % IV SOLN
50.0000 mg/m2 | Freq: Once | INTRAVENOUS | Status: AC
  Administered 2022-01-09: 90 mg via INTRAVENOUS
  Filled 2022-01-09: qty 15

## 2022-01-09 MED ORDER — SODIUM CHLORIDE 0.9 % IV SOLN
10.0000 mg | Freq: Once | INTRAVENOUS | Status: AC
  Administered 2022-01-09: 10 mg via INTRAVENOUS
  Filled 2022-01-09: qty 10

## 2022-01-09 MED ORDER — SODIUM CHLORIDE 0.9 % IV SOLN
235.2000 mg | Freq: Once | INTRAVENOUS | Status: AC
  Administered 2022-01-09: 240 mg via INTRAVENOUS
  Filled 2022-01-09: qty 24

## 2022-01-09 MED ORDER — SODIUM CHLORIDE 0.9 % IV SOLN: 200.0000 mg | Freq: Once | INTRAVENOUS | Status: DC

## 2022-01-09 MED ORDER — IRON SUCROSE 20 MG/ML IV SOLN
200.0000 mg | Freq: Once | INTRAVENOUS | Status: AC
  Administered 2022-01-09: 200 mg via INTRAVENOUS
  Filled 2022-01-09: qty 10

## 2022-01-09 MED ORDER — PALONOSETRON HCL INJECTION 0.25 MG/5ML
0.2500 mg | Freq: Once | INTRAVENOUS | Status: AC
  Administered 2022-01-09: 0.25 mg via INTRAVENOUS
  Filled 2022-01-09: qty 5

## 2022-01-09 MED ORDER — SODIUM CHLORIDE 0.9 % IV SOLN
Freq: Once | INTRAVENOUS | Status: AC
  Filled 2022-01-09: qty 250

## 2022-01-09 MED ORDER — DIPHENHYDRAMINE HCL 50 MG/ML IJ SOLN
25.0000 mg | Freq: Once | INTRAMUSCULAR | Status: AC
  Administered 2022-01-09: 25 mg via INTRAVENOUS
  Filled 2022-01-09: qty 1

## 2022-01-09 NOTE — Progress Notes (Signed)
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  Past medical history of DM, HLD.  Patient receiving concurrent radiation and chemotherapy (carbo/taxol).  Met with patient today in infusion.  Patient reports that appetite is good.  Sometimes has nausea but says that he takes the pill and it helps.  Says that he has a little bit of pain with swallowing foods but does not keep him from eating particular foods.  Typically has 2 eggs, whole wheat bread sometimes 2 slices of bacon and coffee for breakfast.  Lunch is typically chicken or fish (salmon), beans and cooked vegetables.  Limits corn tortillas to 3 per day due to blood glucose.  Evening meal is similar to lunch.  Liked glucerna shake that was given in the hospital but can't afford them.     Medications: glipizide, metformin, zofran, protonix, compazine, carafate    Labs: reviewed   Anthropometrics:   Height: 66 inches Weight: 147 lb 143 lb 1/24 147 lb on 1/12 146 lb on 12/5 BMI: 23  Patient says that he lost about 30 lb recently but this was intentional and he was walking.   He wants to keep weight ~150 lb   Estimated Energy Needs  Kcals: 1675-2000 Protein: 84-100 g Fluid: 1675-2000 ml   NUTRITION DIAGNOSIS: Predicted suboptimal energy intake related to cancer related treatment side effects and esophageal cancer as evidenced by pain on swallowing, some nausea with treatment limited intake   INTERVENTION:  Discussed importance of good nutrition during treatment and weight maintenance Encouraged good sources of protein and examples given Provided samples of glucerna and ensure max protein along with coupons.  Written additional low sugar shake options provided to patient.   Contact information provided   MONITORING, EVALUATION, GOAL: weight trends, intake   Next Visit: Tuesday,  Feb 7 during infusion  Danira Nylander B. Zenia Resides, [place], [place] Registered Dietitian [phone] (mobile)

## 2022-01-09 NOTE — Progress Notes (Signed)
Pt is needing a refill on his pain medication.  He states that the 10MG  is not working enough and would like a refill of extra ones because he is having to take more than 4 a day sometimes. Pt was prescribed 120 tabs on 12/18/2021 he has around 32 pills left and says that he is worried that it will not last him til his next refill. He states that he pain is getting worse. Pt states that he sees [place] for this but would like to transition the pain management here.

## 2022-01-09 NOTE — Progress Notes (Signed)
[place]  [phone]) [phone] [phone]) [phone]  ID: Samuel Pennington OB: Jun 19, 1966  MR#: [id number]  XQJ#:[id number]  Patient Care Team: Theotis Burrow, MD as PCP - General (Family Medicine) Lloyd Huger, MD as Consulting Physician (Oncology) Clent Jacks, RN as Oncology Nurse Navigator  CHIEF COMPLAINT: Stage III poorly differentiated squamous cell of the esophagus.  INTERVAL HISTORY: Patient returns to clinic today for further evaluation and consideration of cycle 2 of weekly carboplatinum and Taxol along with daily XRT.  He tolerated his first infusion well without significant side effects.  He has some mild discomfort with swallowing.  He also has chronic back pain that is being managed by the [facility] pain clinic.  He otherwise feels well.  He has no further GI bleeds. He has no neurologic complaints.  He denies any recent fevers.  He has a fair appetite, but denies weight loss.  He has no chest pain, shortness of breath, cough, or hemoptysis.  He denies any nausea, vomiting, constipation, or diarrhea.  He has no further melena or hematochezia.  He has no urinary complaints.  Patient offers no further specific complaints today.   REVIEW OF SYSTEMS:   Review of Systems  Constitutional: Negative.  Negative for fever, malaise/fatigue and weight loss.  Respiratory: Negative.  Negative for cough, hemoptysis and shortness of breath.   Cardiovascular: Negative.  Negative for chest pain and leg swelling.  Gastrointestinal: Negative.  Negative for abdominal pain, blood in stool and melena.  Genitourinary: Negative.  Negative for dysuria.  Musculoskeletal:  Positive for back pain.  Skin: Negative.  Negative for rash.  Neurological: Negative.  Negative for dizziness, focal weakness, weakness and headaches.  Psychiatric/Behavioral: Negative.  The patient is not nervous/anxious.     As per HPI. Otherwise, a complete review of systems is  negative.  PAST MEDICAL HISTORY: Past Medical History:  Diagnosis Date   Cancer ([place])    Diabetes mellitus without complication ([place])    Hyperlipidemia    Neuromuscular disorder ([place])     PAST SURGICAL HISTORY: Past Surgical History:  Procedure Laterality Date   COLONOSCOPY WITH PROPOFOL N/A 11/14/2021   Procedure: COLONOSCOPY WITH PROPOFOL;  Surgeon: Lesly Rubenstein, MD;  Location: [facility] ENDOSCOPY;  Service: Endoscopy;  Laterality: N/A;   ESOPHAGOGASTRODUODENOSCOPY (EGD) WITH PROPOFOL N/A 11/14/2021   Procedure: ESOPHAGOGASTRODUODENOSCOPY (EGD) WITH PROPOFOL;  Surgeon: Lesly Rubenstein, MD;  Location: [facility] ENDOSCOPY;  Service: Endoscopy;  Laterality: N/A;   EUS N/A 12/14/2021   Procedure: FULL UPPER ENDOSCOPIC ULTRASOUND (EUS) RADIAL;  Surgeon: Holly Bodily, MD;  Location: [facility] ENDOSCOPY;  Service: Gastroenterology;  Laterality: N/A;  SPANISH INTERPRETER    FAMILY HISTORY: Family History  Problem Relation Age of Onset   Hypertension Mother     ADVANCED DIRECTIVES (Y/N):  N  HEALTH MAINTENANCE: Social History   Tobacco Use   Smoking status: Never   Smokeless tobacco: Never  Vaping Use   Vaping Use: Never used  Substance Use Topics   Alcohol use: Yes    Comment: occassionallh   Drug use: Never     Colonoscopy:  PAP:  Bone density:  Lipid panel:  No Known Allergies  Current Outpatient Medications  Medication Sig Dispense Refill   lidocaine-prilocaine (EMLA) cream Apply to affected area once 30 g 3   metFORMIN (GLUCOPHAGE) 1000 MG tablet Take 1,000 mg by mouth daily.     ondansetron (ZOFRAN) 4 MG tablet Take 2 tablets (8 mg total) by mouth 2 (two) times daily  as needed for refractory nausea / vomiting. 120 tablet 1   oxyCODONE (OXY IR/ROXICODONE) 5 MG immediate release tablet Take 10 mg by mouth every 6 (six) hours as needed for pain.     pantoprazole (PROTONIX) 40 MG tablet Take 1 tablet (40 mg total) by mouth daily. 30 tablet 0   prochlorperazine  (COMPAZINE) 10 MG tablet Take 1 tablet (10 mg total) by mouth every 6 (six) hours as needed (Nausea or vomiting). 60 tablet 2   sucralfate (CARAFATE) 1 GM/10ML suspension Take 10 mLs (1 g total) by mouth 4 (four) times daily -  with meals and at bedtime. 1200 mL 0   atorvastatin (LIPITOR) 40 MG tablet TOMA UNA TABLETA POR BOCA AL ACOSTARSE PARA COLESTEROL ALTO 90 tablet 3   glipiZIDE (GLUCOTROL XL) 5 MG 24 hr tablet TOME 1 TABLETA POR BOCA DIARIO PARA LA DIABETES 90 tablet 1   No current facility-administered medications for this visit.    OBJECTIVE: Vitals:   01/09/22 0912  BP: 120/75  Pulse: 69  Temp: (!) 96.7 F (35.9 C)       Body mass index is 23.73 kg/m.    ECOG FS:0 - Asymptomatic  General: Well-developed, well-nourished, no acute distress. Eyes: Pink conjunctiva, anicteric sclera. HEENT: Normocephalic, moist mucous membranes. Lungs: No audible wheezing or coughing. Heart: Regular rate and rhythm. Abdomen: Soft, nontender, no obvious distention. Musculoskeletal: No edema, cyanosis, or clubbing. Neuro: Alert, answering all questions appropriately. Cranial nerves grossly intact. Skin: No rashes or petechiae noted. Psych: Normal affect.  LAB RESULTS:  Lab Results  Component Value Date   NA 139 01/09/2022   K 4.1 01/09/2022   CL 104 01/09/2022   CO2 28 01/09/2022   GLUCOSE 99 01/09/2022   BUN 16 01/09/2022   CREATININE 0.83 01/09/2022   CALCIUM 9.2 01/09/2022   PROT 7.3 01/09/2022   ALBUMIN 3.9 01/09/2022   AST 14 (L) 01/09/2022   ALT 13 01/09/2022   ALKPHOS 56 01/09/2022   BILITOT 0.1 (L) 01/09/2022   GFRNONAA >60 01/09/2022    Lab Results  Component Value Date   WBC 5.0 01/09/2022   NEUTROABS 3.6 01/09/2022   HGB 10.5 (L) 01/09/2022   HCT 33.7 (L) 01/09/2022   MCV 89.2 01/09/2022   PLT 238 01/09/2022   Lab Results  Component Value Date   IRON 26 (L) 12/21/2021   TIBC 325 12/21/2021   IRONPCTSAT 8 (L) 12/21/2021   Lab Results  Component Value  Date   FERRITIN 15 (L) 12/21/2021     STUDIES: NM PET Image Initial (PI) Skull Base To Thigh  Result Date: 12/14/2021 CLINICAL DATA:  Initial treatment strategy for staging of esophageal adenocarcinoma. EXAM: NUCLEAR MEDICINE PET SKULL BASE TO THIGH TECHNIQUE: 7.7 mCi F-18 FDG was injected intravenously. Full-ring PET imaging was performed from the skull base to thigh after the radiotracer. CT data was obtained and used for attenuation correction and anatomic localization. Fasting blood glucose: 124 mg/dl COMPARISON:  Chest abdomen and pelvic CTs of 11/15/2021. FINDINGS: Mediastinal blood pool activity: SUV max 1.9 Liver activity: SUV max NA NECK: No areas of abnormal hypermetabolism. Incidental CT findings: No cervical adenopathy. CHEST: No pulmonary parenchymal or thoracic nodal hypermetabolism. Incidental CT findings: Deferred to recent diagnostic CT. Aortic and coronary artery calcification. Left hemidiaphragm elevation. Esophageal fluid level may represent a component of obstruction secondary to the below described mass. ABDOMEN/PELVIS: Hypermetabolism within the gastric cardia consistent with the site of primary. Example at 5.1 x 3.4 cm and a S.U.V.  max of 13.1 on 127/3. No abdominopelvic nodal hypermetabolism. Incidental CT findings: Deferred to recent diagnostic CT. Abdominal aortic atherosclerosis. Mild prostatomegaly. SKELETON: No abnormal marrow activity. Incidental CT findings: none IMPRESSION: 1. Hypermetabolic GE junction mass, currently centered in the gastric cardia. 2. No evidence of hypermetabolic nodal or distant metastasis. 3. Incidental findings, including: Coronary artery atherosclerosis. Aortic Atherosclerosis (ICD10-I70.0). Prostatomegaly. Electronically Signed   By: Abigail Miyamoto M.D.   On: 12/14/2021 09:10   DG Chest Port 1 View  Result Date: 12/18/2021 CLINICAL DATA:  Black stools that started this weekend. Recently diagnosed with esophageal adenocarcinoma. EXAM: PORTABLE CHEST 1  VIEW COMPARISON:  Radiographs 11/11/2021 and 02/13/2009. CT 11/15/2021. PET-CT 12/13/2021. FINDINGS: 1502 hours. The heart size and mediastinal contours are stable. There are lower lung volumes with mild resulting atelectasis at both lung bases. No edema, confluent airspace opacity, pleural effusion or pneumothorax. The bones appear unchanged. Telemetry leads overlie the chest. IMPRESSION: No evidence of acute cardiopulmonary process. Suboptimal inspiration with resulting mild bibasilar atelectasis. Electronically Signed   By: Richardean Sale M.D.   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  PET scan results from December 13, 2021 reviewed independently and report as above with no obvious metastatic disease outside of patient's known esophageal mass.  EUS completed on December 14, 2021 confirmed stage.  Patient had consultation with radiation oncology today as well.  We discussed possible port placement, but patient declined.  He will benefit from concurrent chemotherapy and XRT using weekly carboplatinum and Taxol.  This will be followed by surgical resection, likely at [facility].  Proceed with cycle 2 of weekly carboplatinum and Taxol.  Continue daily XRT.  Return to clinic in 1 week for further evaluation and consideration of cycle 3.     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  Hemoglobin stable at 10.5.  Proceed with IV Venofer today.   Pain: Patient is [facility] pain clinic and receives narcotics from them.  Continue follow-up as scheduled.   Appetite: Patient reports his weight is stable and he is able to tolerate p.o. intake.  Continue to monitor and if patient's weight begins to decline we will send a referral to dietary.  Patient expressed understanding and was in  agreement with this plan. He also understands that He can call clinic at any time with any questions, concerns, or complaints.    Cancer Staging  Esophageal adenocarcinoma [facility]) Staging form: Esophagus - Adenocarcinoma, AJCC 8th Edition - Clinical stage from 12/22/2021: Stage III (cT3, cN0, cM0) - Signed by Lloyd Huger, MD on 12/22/2021 Stage prefix: Initial diagnosis Total positive nodes: 0   [place] Huger, MD   01/09/2022 9:24 AM

## 2022-01-09 NOTE — Patient Instructions (Signed)
[facility]  Discharge Instructions: °Thank you for choosing [facility] to provide your oncology and hematology care.  °If you have a lab appointment with the Cancer Center, please go directly to the Cancer Center and check in at the registration area. ° °Wear comfortable clothing and clothing appropriate for easy access to any Portacath or PICC line.  ° °We strive to give you quality time with your provider. You may need to reschedule your appointment if you arrive late (15 or more minutes).  Arriving late affects you and other patients whose appointments are after yours.  Also, if you miss three or more appointments without notifying the office, you may be dismissed from the clinic at the provider’s discretion.    °  °For prescription refill requests, have your pharmacy contact our office and allow 72 hours for refills to be completed.   ° °Today you received the following chemotherapy and/or immunotherapy agents : Taxol / Carboplatin   °  °To help prevent nausea and vomiting after your treatment, we encourage you to take your nausea medication as directed. ° °BELOW ARE SYMPTOMS THAT SHOULD BE REPORTED IMMEDIATELY: °*FEVER GREATER THAN 100.4 F (38 °C) OR HIGHER °*CHILLS OR SWEATING °*NAUSEA AND VOMITING THAT IS NOT CONTROLLED WITH YOUR NAUSEA MEDICATION °*UNUSUAL SHORTNESS OF BREATH °*UNUSUAL BRUISING OR BLEEDING °*URINARY PROBLEMS (pain or burning when urinating, or frequent urination) °*BOWEL PROBLEMS (unusual diarrhea, constipation, pain near the anus) °TENDERNESS IN MOUTH AND THROAT WITH OR WITHOUT PRESENCE OF ULCERS (sore throat, sores in mouth, or a toothache) °UNUSUAL RASH, SWELLING OR PAIN  °UNUSUAL VAGINAL DISCHARGE OR ITCHING  ° °Items with * indicate a potential emergency and should be followed up as soon as possible or go to the Emergency Department if any problems should occur. ° °Please show the CHEMOTHERAPY ALERT CARD or IMMUNOTHERAPY ALERT CARD at  check-in to the Emergency Department and triage nurse. ° °Should you have questions after your visit or need to cancel or reschedule your appointment, please contact [facility]  [phone] and follow the prompts.  Office hours are 8:00 a.m. to 4:30 p.m. Monday - Friday. Please note that voicemails left after 4:00 p.m. may not be returned until the following business day.  We are closed weekends and major holidays. You have access to a nurse at all times for urgent questions. Please call the main number to the clinic [phone] and follow the prompts. ° °For any non-urgent questions, you may also contact your provider using MyChart. We now offer e-Visits for anyone 18 and older to request care online for non-urgent symptoms. For details visit [email or web address]. °  °Also download the MyChart app! Go to the app store, search "MyChart", open the app, select [place], and log in with your MyChart username and password. ° °Due to Covid, a mask is required upon entering the hospital/clinic. If you do not have a mask, one will be given to you upon arrival. For doctor visits, patients may have 1 support person aged 18 or older with them. For treatment visits, patients cannot have anyone with them due to current Covid guidelines and our immunocompromised population.  °

## 2022-01-10 ENCOUNTER — Ambulatory Visit
Admission: RE | Admit: 2022-01-10 | Discharge: 2022-01-10 | Disposition: A | Discharge status: Home / Self Care | Payer: Self-pay | Source: Ambulatory Visit | Attending: Radiation Oncology | Admitting: Radiation Oncology

## 2022-01-10 DIAGNOSIS — C159 Malignant neoplasm of esophagus, unspecified: Secondary | ICD-10-CM | POA: Insufficient documentation

## 2022-01-10 DIAGNOSIS — Z510 Encounter for antineoplastic radiation therapy: Secondary | ICD-10-CM | POA: Insufficient documentation

## 2022-01-10 DIAGNOSIS — M549 Dorsalgia, unspecified: Secondary | ICD-10-CM | POA: Diagnosis not present

## 2022-01-10 DIAGNOSIS — R1310 Dysphagia, unspecified: Secondary | ICD-10-CM | POA: Diagnosis not present

## 2022-01-10 DIAGNOSIS — D630 Anemia in neoplastic disease: Secondary | ICD-10-CM | POA: Diagnosis not present

## 2022-01-10 DIAGNOSIS — Z5111 Encounter for antineoplastic chemotherapy: Secondary | ICD-10-CM | POA: Diagnosis present

## 2022-01-10 DIAGNOSIS — G8929 Other chronic pain: Secondary | ICD-10-CM | POA: Diagnosis not present

## 2022-01-11 ENCOUNTER — Ambulatory Visit
Admission: RE | Admit: 2022-01-11 | Discharge: 2022-01-11 | Disposition: A | Discharge status: Home / Self Care | Payer: Self-pay | Source: Ambulatory Visit | Attending: Radiation Oncology | Admitting: Radiation Oncology

## 2022-01-11 DIAGNOSIS — Z510 Encounter for antineoplastic radiation therapy: Secondary | ICD-10-CM | POA: Diagnosis not present

## 2022-01-12 ENCOUNTER — Ambulatory Visit
Admission: RE | Admit: 2022-01-12 | Discharge: 2022-01-12 | Disposition: A | Discharge status: Home / Self Care | Payer: Self-pay | Source: Ambulatory Visit | Attending: Radiation Oncology | Admitting: Radiation Oncology

## 2022-01-12 DIAGNOSIS — Z510 Encounter for antineoplastic radiation therapy: Secondary | ICD-10-CM | POA: Diagnosis not present

## 2022-01-15 ENCOUNTER — Ambulatory Visit
Admission: RE | Admit: 2022-01-15 | Discharge: 2022-01-15 | Disposition: A | Discharge status: Home / Self Care | Payer: Self-pay | Source: Ambulatory Visit | Attending: Radiation Oncology | Admitting: Radiation Oncology

## 2022-01-15 DIAGNOSIS — Z510 Encounter for antineoplastic radiation therapy: Secondary | ICD-10-CM | POA: Diagnosis not present

## 2022-01-15 NOTE — Progress Notes (Signed)
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  INTERVAL HISTORY: Patient returns to clinic today for further evaluation and consideration of cycle 3 of weekly carboplatin and Taxol along with his daily XRT.  He continues to have mild discomfort with swallowing.  He also has chronic back pain that is being managed by the [facility] pain clinic.  He otherwise feels well.  He has no further GI bleeds. He has no neurologic complaints.  He denies any recent fevers.  He has a fair appetite, but denies weight loss.  He has no chest pain, shortness of breath, cough, or hemoptysis.  He denies any nausea, vomiting, constipation, or diarrhea.  He has no further melena or hematochezia.  He has no urinary complaints.  Patient offers no further specific complaints today.  REVIEW OF SYSTEMS:   Review of Systems  Constitutional: Negative.  Negative for fever, malaise/fatigue and weight loss.  Respiratory: Negative.  Negative for cough, hemoptysis and shortness of breath.   Cardiovascular: Negative.  Negative for chest pain and leg swelling.  Gastrointestinal: Negative.  Negative for abdominal pain, blood in stool and melena.  Genitourinary: Negative.  Negative for dysuria.  Musculoskeletal:  Positive for back pain.  Skin: Negative.  Negative for rash.  Neurological: Negative.  Negative for dizziness, focal weakness, weakness and headaches.  Psychiatric/Behavioral: Negative.  The patient is not nervous/anxious.     As per HPI. Otherwise, a complete review of systems is negative.  PAST MEDICAL HISTORY: Past Medical History:  Diagnosis Date    Cancer ([place])    Diabetes mellitus without complication ([place])    Hyperlipidemia    Neuromuscular disorder ([place])     PAST SURGICAL HISTORY: Past Surgical History:  Procedure Laterality Date   COLONOSCOPY WITH PROPOFOL N/A 11/14/2021   Procedure: COLONOSCOPY WITH PROPOFOL;  Surgeon: Lesly Rubenstein, MD;  Location: [facility] ENDOSCOPY;  Service: Endoscopy;  Laterality: N/A;   ESOPHAGOGASTRODUODENOSCOPY (EGD) WITH PROPOFOL N/A 11/14/2021   Procedure: ESOPHAGOGASTRODUODENOSCOPY (EGD) WITH PROPOFOL;  Surgeon: Lesly Rubenstein, MD;  Location: [facility] ENDOSCOPY;  Service: Endoscopy;  Laterality: N/A;   EUS N/A 12/14/2021   Procedure: FULL UPPER ENDOSCOPIC ULTRASOUND (EUS) RADIAL;  Surgeon: Holly Bodily, MD;  Location: [facility] ENDOSCOPY;  Service: Gastroenterology;  Laterality: N/A;  SPANISH INTERPRETER    FAMILY HISTORY: Family History  Problem Relation Age of Onset   Hypertension Mother     ADVANCED DIRECTIVES (Y/N):  N  HEALTH MAINTENANCE: Social History   Tobacco Use   Smoking status: Never   Smokeless tobacco: Never  Vaping Use   Vaping Use: Never used  Substance Use Topics   Alcohol use: Yes    Comment: occassionallh   Drug use: Never     Colonoscopy:  PAP:  Bone density:  Lipid panel:  No Known Allergies  Current Outpatient Medications  Medication Sig Dispense Refill   lidocaine-prilocaine (EMLA) cream Apply to affected area once 30 g 3   metFORMIN (GLUCOPHAGE) 1000 MG tablet Take 1,000 mg by mouth daily.     ondansetron (ZOFRAN) 4 MG tablet Take 2 tablets (8 mg total) by mouth 2 (two) times daily as needed for refractory nausea / vomiting. 120 tablet 1  oxyCODONE (OXY IR/ROXICODONE) 5 MG immediate release tablet Take 10 mg by mouth every 6 (six) hours as needed for pain.     pantoprazole (PROTONIX) 40 MG tablet Take 1 tablet (40 mg total) by mouth daily. 30 tablet 0   prochlorperazine (COMPAZINE) 10 MG tablet Take 1 tablet (10 mg total) by mouth every 6 (six)  hours as needed (Nausea or vomiting). 60 tablet 2   sucralfate (CARAFATE) 1 GM/10ML suspension Take 10 mLs (1 g total) by mouth 4 (four) times daily -  with meals and at bedtime. 1200 mL 0   atorvastatin (LIPITOR) 40 MG tablet TOMA UNA TABLETA POR BOCA AL ACOSTARSE PARA COLESTEROL ALTO 90 tablet 3   glipiZIDE (GLUCOTROL XL) 5 MG 24 hr tablet TOME 1 TABLETA POR BOCA DIARIO PARA LA DIABETES 90 tablet 1   No current facility-administered medications for this visit.   Facility-Administered Medications Ordered in Other Visits  Medication Dose Route Frequency Provider Last Rate Last Admin   CARBOplatin (PARAPLATIN) 240 mg in sodium chloride 0.9 % 250 mL chemo infusion  240 mg Intravenous Once Lloyd Huger, MD       heparin lock flush 100 unit/mL  500 Units Intracatheter Once PRN Lloyd Huger, MD       PACLitaxel (TAXOL) 90 mg in sodium chloride 0.9 % 250 mL chemo infusion (</= 80mg /m2)  50 mg/m2 (Treatment Plan Recorded) Intravenous Once Lloyd Huger, MD 265 mL/hr at 01/16/22 1105 90 mg at 01/16/22 1105   sodium chloride flush (NS) 0.9 % injection 10 mL  10 mL Intracatheter PRN Lloyd Huger, MD        OBJECTIVE: Vitals:   01/16/22 0900  BP: 122/60  Pulse: 63  Resp: 18  Temp: (!) 97.3 F (36.3 C)       Body mass index is 23.9 kg/m.    ECOG FS:0 - Asymptomatic  General: Well-developed, well-nourished, no acute distress. Eyes: Pink conjunctiva, anicteric sclera. HEENT: Normocephalic, moist mucous membranes. Lungs: No audible wheezing or coughing. Heart: Regular rate and rhythm. Abdomen: Soft, nontender, no obvious distention. Musculoskeletal: No edema, cyanosis, or clubbing. Neuro: Alert, answering all questions appropriately. Cranial nerves grossly intact. Skin: No rashes or petechiae noted. Psych: Normal affect.  LAB RESULTS:  Lab Results  Component Value Date   NA 138 01/16/2022   K 4.2 01/16/2022   CL 104 01/16/2022   CO2 28 01/16/2022   GLUCOSE  132 (H) 01/16/2022   BUN 20 01/16/2022   CREATININE 0.72 01/16/2022   CALCIUM 8.9 01/16/2022   PROT 7.1 01/16/2022   ALBUMIN 4.0 01/16/2022   AST 14 (L) 01/16/2022   ALT 15 01/16/2022   ALKPHOS 55 01/16/2022   BILITOT <0.1 (L) 01/16/2022   GFRNONAA >60 01/16/2022    Lab Results  Component Value Date   WBC 4.0 01/16/2022   NEUTROABS 2.7 01/16/2022   HGB 10.8 (L) 01/16/2022   HCT 34.3 (L) 01/16/2022   MCV 88.6 01/16/2022   PLT 161 01/16/2022   Lab Results  Component Value Date   IRON 26 (L) 12/21/2021   TIBC 325 12/21/2021   IRONPCTSAT 8 (L) 12/21/2021   Lab Results  Component Value Date   FERRITIN 15 (L) 12/21/2021     STUDIES: DG Chest Port 1 View  Result Date: 12/18/2021 CLINICAL DATA:  Black stools that started this weekend. Recently diagnosed with esophageal adenocarcinoma. EXAM: PORTABLE CHEST 1 VIEW COMPARISON:  Radiographs 11/11/2021 and 02/13/2009. CT 11/15/2021. PET-CT 12/13/2021. FINDINGS: 1502 hours. The heart  size and mediastinal contours are stable. There are lower lung volumes with mild resulting atelectasis at both lung bases. No edema, confluent airspace opacity, pleural effusion or pneumothorax. The bones appear unchanged. Telemetry leads overlie the chest. IMPRESSION: No evidence of acute cardiopulmonary process. Suboptimal inspiration with resulting mild bibasilar atelectasis. Electronically Signed   By: Richardean Sale M.D.   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  PET scan results from December 13, 2021 reviewed independently and report as above with no obvious metastatic disease outside of patient's known esophageal mass.  EUS completed on December 14, 2021 confirmed stage.  Patient had consultation with radiation oncology today as well.  We discussed possible  port placement, but patient declined.  He will benefit from concurrent chemotherapy and XRT using weekly carboplatinum and Taxol.  This will be followed by surgical resection, likely at [facility].  Proceed with cycle 3 of weekly carboplatin and Taxol today.  Continue daily XRT.  Return to clinic in 1 week for further evaluation and consideration of cycle 4.      Iron deficiency anemia: Chronic and unchanged.  Patient's hemoglobin is essentially stable at 10.8.  Proceed with his fifth and final dose of IV Venofer today.   Pain: Patient is seen regularly by [facility] pain clinic and receives narcotics from them.  Continue follow-up as scheduled.   Appetite: Patient reports his weight is stable and he is able to tolerate p.o. intake.  Monitor.  Consider referral to dietary if needed.  I spent a total of 30 minutes reviewing chart data, face-to-face evaluation with the patient, counseling and coordination of care as detailed above.   Patient expressed understanding and was in agreement with this plan. He also understands that He can call clinic at any time with any questions, concerns, or complaints.    Cancer Staging  Esophageal adenocarcinoma [facility]) Staging form: Esophagus - Adenocarcinoma, AJCC 8th Edition - Clinical stage from 12/22/2021: Stage III (cT3, cN0, cM0) - Signed by Lloyd Huger, MD on 12/22/2021 Stage prefix: Initial diagnosis Total positive nodes: 0   Lloyd Huger, MD   01/16/2022 11:39 AM

## 2022-01-16 ENCOUNTER — Inpatient Hospital Stay: Payer: Self-pay

## 2022-01-16 ENCOUNTER — Other Ambulatory Visit: Payer: Self-pay

## 2022-01-16 ENCOUNTER — Ambulatory Visit
Admission: RE | Admit: 2022-01-16 | Discharge: 2022-01-16 | Disposition: A | Discharge status: Home / Self Care | Payer: Self-pay | Source: Ambulatory Visit | Attending: Radiation Oncology | Admitting: Radiation Oncology

## 2022-01-16 ENCOUNTER — Inpatient Hospital Stay: Payer: Self-pay | Attending: Oncology

## 2022-01-16 ENCOUNTER — Ambulatory Visit: Payer: Self-pay

## 2022-01-16 ENCOUNTER — Inpatient Hospital Stay (HOSPITAL_BASED_OUTPATIENT_CLINIC_OR_DEPARTMENT_OTHER): Payer: Self-pay | Admitting: Oncology

## 2022-01-16 VITALS — BP 122/60 | HR 63 | Temp 97.3°F | Resp 18 | Wt 148.1 lb

## 2022-01-16 VITALS — BP 96/62 | HR 70 | Resp 16

## 2022-01-16 DIAGNOSIS — D709 Neutropenia, unspecified: Secondary | ICD-10-CM | POA: Insufficient documentation

## 2022-01-16 DIAGNOSIS — M549 Dorsalgia, unspecified: Secondary | ICD-10-CM | POA: Insufficient documentation

## 2022-01-16 DIAGNOSIS — C159 Malignant neoplasm of esophagus, unspecified: Secondary | ICD-10-CM

## 2022-01-16 DIAGNOSIS — Z5111 Encounter for antineoplastic chemotherapy: Secondary | ICD-10-CM | POA: Insufficient documentation

## 2022-01-16 DIAGNOSIS — G8929 Other chronic pain: Secondary | ICD-10-CM | POA: Insufficient documentation

## 2022-01-16 DIAGNOSIS — Z510 Encounter for antineoplastic radiation therapy: Secondary | ICD-10-CM | POA: Diagnosis not present

## 2022-01-16 DIAGNOSIS — D630 Anemia in neoplastic disease: Secondary | ICD-10-CM | POA: Insufficient documentation

## 2022-01-16 DIAGNOSIS — R1310 Dysphagia, unspecified: Secondary | ICD-10-CM | POA: Insufficient documentation

## 2022-01-16 LAB — CBC WITH DIFFERENTIAL/PLATELET
Abs Immature Granulocytes: 0.03 10*3/uL (ref 0.00–0.07)
Basophils Absolute: 0 10*3/uL (ref 0.0–0.1)
Basophils Relative: 1 %
Eosinophils Absolute: 0.2 10*3/uL (ref 0.0–0.5)
Eosinophils Relative: 4 %
HCT: 34.3 % — ABNORMAL LOW (ref 39.0–52.0)
Hemoglobin: 10.8 g/dL — ABNORMAL LOW (ref 13.0–17.0)
Immature Granulocytes: 1 %
Lymphocytes Relative: 18 %
Lymphs Abs: 0.7 10*3/uL (ref 0.7–4.0)
MCH: 27.9 pg (ref 26.0–34.0)
MCHC: 31.5 g/dL (ref 30.0–36.0)
MCV: 88.6 fL (ref 80.0–100.0)
Monocytes Absolute: 0.3 10*3/uL (ref 0.1–1.0)
Monocytes Relative: 8 %
Neutro Abs: 2.7 10*3/uL (ref 1.7–7.7)
Neutrophils Relative %: 68 %
Platelets: 161 10*3/uL (ref 150–400)
RBC: 3.87 MIL/uL — ABNORMAL LOW (ref 4.22–5.81)
RDW: 17 % — ABNORMAL HIGH (ref 11.5–15.5)
WBC: 4 10*3/uL (ref 4.0–10.5)
nRBC: 0 % (ref 0.0–0.2)

## 2022-01-16 LAB — COMPREHENSIVE METABOLIC PANEL
ALT: 15 U/L (ref 0–44)
AST: 14 U/L — ABNORMAL LOW (ref 15–41)
Albumin: 4 g/dL (ref 3.5–5.0)
Alkaline Phosphatase: 55 U/L (ref 38–126)
Anion gap: 6 (ref 5–15)
BUN: 20 mg/dL (ref 6–20)
CO2: 28 mmol/L (ref 22–32)
Calcium: 8.9 mg/dL (ref 8.9–10.3)
Chloride: 104 mmol/L (ref 98–111)
Creatinine, Ser: 0.72 mg/dL (ref 0.61–1.24)
GFR, Estimated: >60 mL/min (ref >60)
Glucose, Bld: 132 mg/dL — ABNORMAL HIGH (ref 70–99)
Potassium: 4.2 mmol/L (ref 3.5–5.1)
Sodium: 138 mmol/L (ref 135–145)
Total Bilirubin: <0.1 mg/dL — ABNORMAL LOW (ref 0.3–1.2)
Total Protein: 7.1 g/dL (ref 6.5–8.1)

## 2022-01-16 MED ORDER — SODIUM CHLORIDE 0.9 % IV SOLN
242.2000 mg | Freq: Once | INTRAVENOUS | Status: AC
  Administered 2022-01-16: 240 mg via INTRAVENOUS
  Filled 2022-01-16: qty 24

## 2022-01-16 MED ORDER — IRON SUCROSE 20 MG/ML IV SOLN
200.0000 mg | Freq: Once | INTRAVENOUS | Status: AC
  Administered 2022-01-16: 200 mg via INTRAVENOUS
  Filled 2022-01-16: qty 10

## 2022-01-16 MED ORDER — SODIUM CHLORIDE 0.9 % IV SOLN: 200.0000 mg | Freq: Once | INTRAVENOUS | Status: DC

## 2022-01-16 MED ORDER — FAMOTIDINE IN NACL 20-0.9 MG/50ML-% IV SOLN
20.0000 mg | Freq: Once | INTRAVENOUS | Status: AC
  Administered 2022-01-16: 20 mg via INTRAVENOUS
  Filled 2022-01-16: qty 50

## 2022-01-16 MED ORDER — SODIUM CHLORIDE 0.9 % IV SOLN
50.0000 mg/m2 | Freq: Once | INTRAVENOUS | Status: AC
  Administered 2022-01-16: 90 mg via INTRAVENOUS
  Filled 2022-01-16: qty 15

## 2022-01-16 MED ORDER — HEPARIN SOD (PORK) LOCK FLUSH 100 UNIT/ML IV SOLN
INTRAVENOUS | Status: AC
  Filled 2022-01-16: qty 5

## 2022-01-16 MED ORDER — SODIUM CHLORIDE 0.9 % IV SOLN
10.0000 mg | Freq: Once | INTRAVENOUS | Status: AC
  Administered 2022-01-16: 10 mg via INTRAVENOUS
  Filled 2022-01-16: qty 10

## 2022-01-16 MED ORDER — SODIUM CHLORIDE 0.9% FLUSH
10.0000 mL | INTRAVENOUS | Status: DC | PRN
  Filled 2022-01-16: qty 10

## 2022-01-16 MED ORDER — HEPARIN SOD (PORK) LOCK FLUSH 100 UNIT/ML IV SOLN
500.0000 [IU] | Freq: Once | INTRAVENOUS | Status: DC | PRN
  Filled 2022-01-16: qty 5

## 2022-01-16 MED ORDER — PALONOSETRON HCL INJECTION 0.25 MG/5ML
0.2500 mg | Freq: Once | INTRAVENOUS | Status: AC
  Administered 2022-01-16: 0.25 mg via INTRAVENOUS
  Filled 2022-01-16: qty 5

## 2022-01-16 MED ORDER — DIPHENHYDRAMINE HCL 50 MG/ML IJ SOLN
25.0000 mg | Freq: Once | INTRAMUSCULAR | Status: AC
  Administered 2022-01-16: 25 mg via INTRAVENOUS
  Filled 2022-01-16: qty 1

## 2022-01-16 MED ORDER — SODIUM CHLORIDE 0.9 % IV SOLN
Freq: Once | INTRAVENOUS | Status: AC
  Filled 2022-01-16: qty 250

## 2022-01-16 NOTE — Patient Instructions (Signed)
[facility] CANCER CTR AT [place]  Discharge Instructions: Thank you for choosing [facility] to provide your oncology and hematology care.  If you have a lab appointment with the [facility], please go directly to the [facility] and check in at the registration area.  Wear comfortable clothing and clothing appropriate for easy access to any Portacath or PICC line.   We strive to give you quality time with your provider. You may need to reschedule your appointment if you arrive late (15 or more minutes).  Arriving late affects you and other patients whose appointments are after yours.  Also, if you miss three or more appointments without notifying the office, you may be dismissed from the clinic at the providers discretion.      For prescription refill requests, have your pharmacy contact our office and allow 72 hours for refills to be completed.    Today you received the following chemotherapy and/or immunotherapy agents - paclitaxel, carboplatin      To help prevent nausea and vomiting after your treatment, we encourage you to take your nausea medication as directed.  BELOW ARE SYMPTOMS THAT SHOULD BE REPORTED IMMEDIATELY: *FEVER GREATER THAN 100.4 F (38 C) OR HIGHER *CHILLS OR SWEATING *NAUSEA AND VOMITING THAT IS NOT CONTROLLED WITH YOUR NAUSEA MEDICATION *UNUSUAL SHORTNESS OF BREATH *UNUSUAL BRUISING OR BLEEDING *URINARY PROBLEMS (pain or burning when urinating, or frequent urination) *BOWEL PROBLEMS (unusual diarrhea, constipation, pain near the anus) TENDERNESS IN MOUTH AND THROAT WITH OR WITHOUT PRESENCE OF ULCERS (sore throat, sores in mouth, or a toothache) UNUSUAL RASH, SWELLING OR PAIN  UNUSUAL VAGINAL DISCHARGE OR ITCHING   Items with * indicate a potential emergency and should be followed up as soon as possible or go to the Emergency Department if any problems should occur.  Please show the CHEMOTHERAPY ALERT CARD or IMMUNOTHERAPY ALERT CARD  at check-in to the Emergency Department and triage nurse.  Should you have questions after your visit or need to cancel or reschedule your appointment, please contact [facility] CANCER [place] AT [place]  [phone] and follow the prompts.  Office hours are 8:00 a.m. to 4:30 p.m. Monday - Friday. Please note that voicemails left after 4:00 p.m. may not be returned until the following business day.  We are closed weekends and major holidays. You have access to a nurse at all times for urgent questions. Please call the main number to the clinic [phone] and follow the prompts.  For any non-urgent questions, you may also contact your provider using MyChart. We now offer e-Visits for anyone 81 and older to request care online for non-urgent symptoms. For details visit [email or web address].   Also download the MyChart app! Go to the app store, search "MyChart", open the app, select [facility], and log in with your MyChart username and password.  Due to Covid, a mask is required upon entering the hospital/clinic. If you do not have a mask, one will be given to you upon arrival. For doctor visits, patients may have 1 support person aged 65 or older with them. For treatment visits, patients cannot have anyone with them due to current Covid guidelines and our immunocompromised population.   Paclitaxel injection Qu es este medicamento? El PACLITAXEL es un agente quimioteraputico. Este medicamento acta sobre las clulas que se dividen rpidamente, como las clulas cancerosas, y finalmente provoca la muerte de estas clulas. Se utiliza en el tratamiento del cncer de ovario, mama, pulmn, sarcoma de [place] y otros tipos de cncer.  Este medicamento puede ser utilizado para otros usos; si tiene alguna pregunta consulte con su proveedor de atencin mdica o con su farmacutico. MARCAS COMUNES: Onxol, Taxol Qu le debo informar a mi profesional de la salud antes de tomar este  medicamento? Necesitan saber si usted presenta alguno de los siguientes problemas o situaciones: antecedentes de frecuencia cardiaca irregular enfermedad heptica recuentos sanguneos bajos, como baja cantidad de glbulos blancos, plaquetas o glbulos rojos enfermedad pulmonar o respiratoria, como asma hormigueo en las manos o los pies, u otro trastorno del sistema nervioso una reaccin alrgica o inusual al paclitaxel, al alcohol, al aceite de ricino polioxietilado, a otros medicamentos quimioteraputicos, a otros medicamentos, alimentos, colorantes o conservantes si est embarazada o buscando quedar embarazada si est amamantando a un beb Cmo debo [employer]? Este medicamento se administra como infusin en una vena. Un profesional de la salud especialmente capacitado lo administra en un hospital o clnica. Hable con su pediatra para informarse acerca del uso de este medicamento en nios. Puede requerir atencin especial. Sobredosis: Pngase en contacto inmediatamente con un centro toxicolgico o una sala de urgencia si usted cree que haya tomado demasiado medicamento. ATENCIN: [employer] es solo para usted. No comparta este medicamento con nadie. Qu sucede si me olvido de una dosis? Es importante no olvidar ninguna dosis. Informe a su mdico o a su profesional de la salud si no puede asistir a Photographer. Qu puede interactuar con este medicamento? No use este medicamento con ninguno de los siguientes frmacos: vacunas de virus vivos Este medicamento tambin puede interactuar con los siguientes frmacos: medicamentos antivirales para la hepatitis, VIH o SIDA ciertos antibiticos, tales como eritromicina y Ambulance person ciertos medicamentos para infecciones micticas, tales como itraconazol y ketoconazol ciertos medicamentos para convulsiones, tales como [place], fenobarbital y fenitona gemfibrozil nefazodona rifampicina hierba de [facility] Puede ser que  esta lista no menciona todas las posibles interacciones. Informe a su profesional de [employer]	[place] de [employer] productos a base de hierbas, medicamentos de [place] o suplementos nutritivos que est tomando. Si usted fuma, consume bebidas alcohlicas o si utiliza drogas ilegales, indqueselo tambin a su profesional de [employer]	[place]. Algunas sustancias pueden interactuar con su medicamento. A qu debo estar atento al usar [employer]? Se supervisar su estado de salud atentamente mientras reciba este medicamento. Tendr que hacerse anlisis de sangre importantes mientras est usando este medicamento. Este medicamento puede causar Chief of Staff graves. Para reducir su riesgo, necesitar tomar otro(s) medicamento(s) antes del tratamiento con este medicamento. Si tiene [employer] erupcin cutnea, comezn/picazn o urticaria, hinchazn del rostro, los labios, o la [place], informe de inmediato a su mdico o profesional de Technical sales engineer. En algunos casos, podra recibir [employer] para ayudarlo con los efectos secundarios. Siga todas las instrucciones para usarlos. Este medicamento podra hacerle sentir un Nurse, mental health. Esto es normal, ya que la quimioterapia puede afectar tanto a las clulas sanas como a las clulas cancerosas. Si presenta algn efecto secundario, infrmelo. Contine con el tratamiento aun si se siente enfermo, a menos que su mdico le indique que lo suspenda. Llame a su mdico o a su profesional de la salud si tiene fiebre, escalofros o dolor de garganta, o cualquier otro sntoma de resfro o gripe. No se trate usted mismo. Este medicamento reduce la capacidad del cuerpo para combatir infecciones. Trate de no acercarse a personas que estn enfermas. Este medicamento podra aumentar el riesgo de moretones o sangrado. Consulte a su mdico o a su  profesional de la salud si observa sangrados inusuales. Proceda con cuidado al cepillar sus dientes, usar hilo dental  o Risk manager palillos para los dientes, ya que podra contraer una infeccin o Therapist, art con mayor facilidad. Si se somete a algn tratamiento dental, informe a su dentista que est [employer]. Evite usar productos que contienen aspirina, acetaminofeno, ibuprofeno, naproxeno o ketoprofeno, a menos que as lo indique su mdico. Estos productos pueden ocultar la fiebre. No debe quedar embarazada mientras est [employer]. Las mujeres deben informar a su mdico si estn buscando quedar embarazadas o si creen que podran estar embarazadas. Existe la posibilidad de efectos secundarios graves en un beb sin nacer. Para obtener ms informacin, hable con su profesional de la salud o su farmacutico. No debe Economist a un beb mientras est usando este medicamento. Para los hombres, se desaconseja concebir hijos mientras reciben Coca[employer]Cola. Este producto podra contener alcohol. Pregunte a Midwife o a su proveedor de atencin de la salud si este medicamento contiene alcohol. Asegrese de decirles a todos los proveedores de atencin de la salud que usted est tomando [employer]. Ciertos medicamentos, como metronidazol y disulfiram, pueden causar una reaccin desagradable cuando se usan con alcohol. Esta reaccin incluye enrojecimiento, dolor de cabeza, nuseas, vmitos, sudoracin y aumento de la sed. La reaccin puede durar de 30 minutos a varias horas. Qu efectos secundarios puedo tener al [employer] este medicamento? Efectos secundarios que debe informar a su mdico o a Barrister's clerk de la salud tan pronto como sea posible: Chief of Staff, tales como erupcin cutnea, comezn/picazn o urticaria, e hinchazn de la cara, los labios o la lengua problemas para respirar cambios en la visin frecuencia cardiaca rpida e irregular presin sangunea alta o baja llagas en la boca dolor, hormigueo o entumecimiento de las manos o los pies signos de disminucin en la  cantidad de plaquetas o sangrado: moretones, puntos rojos en la piel, heces de color negro y aspecto alquitranado, sangre en la orina signos de disminucin en la cantidad de glbulos rojos: debilidad o cansancio inusuales, sensacin de Secondary school teacher o aturdimiento, cadas signos de infeccin: fiebre o escalofros, tos, dolor de garganta, dolor o dificultad para orinar signos y sntomas de lesin en el hgado, tales como orina amarilla oscura o [place]; sensacin general de estar enfermo o sntomas gripales; heces claras; prdida del apetito; nuseas; dolor en la regin abdominal superior derecha; debilidad o cansancio inusuales; color amarillento de los ojos o la piel hinchazn de tobillos, pies, manos frecuencia cardiaca inusualmente lenta Efectos secundarios que generalmente no requieren atencin mdica (infrmelos a su mdico o a su profesional de la salud si persisten o si son molestos): diarrea cada del cabello prdida del apetito dolores musculares o articulares nuseas, vmito dolor, enrojecimiento o Actor de la inyeccin cansancio Puede ser que esta lista no menciona todos los posibles efectos secundarios. Comunquese a su mdico por asesoramiento mdico [employer]. Usted puede informar los efectos secundarios a la FDA por telfono al 1-800-FDA-1088. Dnde debo guardar mi medicina? Este medicamento se administra en hospitales o clnicas, y no necesitar guardarlo en su domicilio. ATENCIN: Este folleto es un resumen. Puede ser que no cubra toda la posible informacin. Si usted tiene preguntas acerca de esta medicina, consulte con su mdico, su farmacutico o su profesional de Technical sales engineer.  2022 Elsevier/Gold Standard (2020-03-31 00:00:00)  Carboplatin injection Qu es este medicamento? El CARBOPLATINO es un agente quimioteraputico. Este medicamento acta sobre las clulas que se  dividen rpidamente, como las Engineer, civil (consulting), y finalmente provoca la  muerte de estas clulas. Se utiliza en el tratamiento del cncer de ovario y muchos otros tipos de Hotel manager. Este medicamento puede ser utilizado para otros usos; si tiene alguna pregunta consulte con su proveedor de atencin mdica o con su farmacutico. MARCAS COMUNES: Paraplatin Qu le debo informar a mi profesional de la salud antes de tomar este medicamento? Necesita saber si usted presenta alguno de los siguientes problemas o situaciones: trastornos sanguneos problemas auditivos enfermedad renal radioterapia reciente o continuada una reaccin alrgica o inusual al carboplatino, al cisplatino, a otros agentes quimioteraputicos, a otros medicamentos, alimentos, colorantes o conservantes si est embarazada o buscando quedar embarazada si est amamantando a un beb Cmo debo [employer]? Este medicamento se administra normalmente mediante infusin por va intravenosa. Lo administra un profesional de la salud calificado en un hospital o en un entorno clnico. Hable con su pediatra para informarse acerca del uso de este medicamento en nios. Puede requerir atencin especial. Sobredosis: Pngase en contacto inmediatamente con un centro toxicolgico o una sala de urgencia si usted cree que haya tomado demasiado medicamento. ATENCIN: [employer] es solo para usted. No comparta este medicamento con nadie. Qu sucede si me olvido de una dosis? Es importante no olvidar ninguna dosis. Informe a su mdico o a su profesional de la salud si no puede asistir a Photographer. Qu puede interactuar con este medicamento? medicamentos para convulsiones medicamentos para incrementar los conteos sanguneos, tales como filgrastim, pegfilgrastim, sargramostim ciertos antibiticos, tales como amicacina, gentamicina, neomicina, estreptomicina, tobramicina vacunas Consulte a su mdico o a su profesional de la salud antes de tomar cualquiera de los siguientes  medicamentos: acetaminofeno aspirina ibuprofeno quetoprofeno naproxeno Puede ser que esta lista no menciona todas las posibles interacciones. Informe a su profesional de [employer]	[place] de [employer] productos a base de hierbas, medicamentos de [place] o suplementos nutritivos que est tomando. Si usted fuma, consume bebidas alcohlicas o si utiliza drogas ilegales, indqueselo tambin a su profesional de [employer]	[place]. Algunas sustancias pueden interactuar con su medicamento. A qu debo estar atento al usar Coca[employer]Cola? Se supervisar su estado de salud atentamente mientras reciba este medicamento. Tendr que hacerse anlisis de sangre peridicos mientras est tomando este medicamento. Este medicamento puede hacerle sentir un Nurse, mental health. Esto es normal ya que la quimioterapia afecta tanto a las clulas sanas como a las clulas cancerosas. Si presenta alguno de los [employer], infrmelos. Sin embargo, contine con el tratamiento aun si se siente enfermo, a menos que su mdico le indique que lo suspenda. En algunos casos, podr recibir [employer]ands para ayudarle con los efectos secundarios. Siga las instrucciones para usarlos. Consulte a su mdico o a su profesional de la salud por asesoramiento si tiene fiebre, escalofros, dolor de garganta o cualquier otro sntoma de resfro o gripe. No se trate usted mismo. Este medicamento puede reducir la capacidad del cuerpo para combatir infecciones. Trate de no acercarse a personas que estn enfermas. [employer] puede aumentar el riesgo de magulladuras o sangrado. Consulte a su mdico o a su profesional de la salud si observa sangrados inusuales. Proceda con cuidado al cepillar sus dientes, usar hilo dental o Risk manager palillos para los dientes, ya que puede contraer una infeccin o Therapist, art con mayor facilidad. Si se somete a algn tratamiento dental, informe a su dentista que est [employer]. Evite tomar productos que  contienen aspirina, acetaminofeno, ibuprofeno, naproxeno o quetoprofeno a menos que  as lo indique su mdico. Estos productos pueden disimular la fiebre. No se debe quedar embarazada mientras recibe este medicamento. Las mujeres deben informar a su mdico si estn buscando quedar embarazadas o si creen que estn embarazadas. Existe la posibilidad de efectos secundarios graves a un beb sin nacer. Para ms informacin hable con su profesional de la salud o su farmacutico. No debe Economist a un beb mientras est usando este medicamento. Qu efectos secundarios puedo tener al [employer] este medicamento? Efectos secundarios que debe informar a su mdico o a Barrister's clerk de la salud tan pronto como sea posible: Chief of Staff como erupcin cutnea, picazn o urticarias, hinchazn de la cara, labios o lengua signos de infeccin - fiebre o escalofros, tos, dolor de garganta, dolor o dificultad para orinar signos de reduccin de plaquetas o sangrado - magulladuras, puntos rojos en la piel, heces de color oscuro o con aspecto alquitranado, sangrando por la nariz signos de reduccin de glbulos rojos - cansancio o debilidad inusual, desmayos, sensacin de [employer] problemas respiratorios cambios de audicin cambios en la visin dolor en el pecho alta presin sangunea conteos sanguneos bajos - Este medicamento puede reducir la cantidad de glbulos blancos, glbulos rojos y plaquetas. Su riesgo de infeccin y [place]. nuseas, vmito dolor, enrojecimiento, hinchazn o irritacin en el lugar de la inyeccin dolor, hormigueo, entumecimiento de manos o pies problemas de coordinacin, del habla, al caminar dificultad para orinar o cambios en el volumen de orina Efectos secundarios que, por lo general, no requieren atencin mdica (debe informarlos a su mdico o a su profesional de la salud si persisten o si son molestos): cada del cabello prdida del apetito sabor metlico o  cambios en el sentido del gusto Puede ser que esta lista no menciona todos los posibles efectos secundarios. Comunquese a su mdico por asesoramiento mdico [employer]. Usted puede informar los efectos secundarios a la FDA por telfono al 1-800-FDA-1088. Dnde debo guardar mi medicina? Este medicamento se administra en hospitales o clnicas y no necesitar guardarlo en su domicilio. ATENCIN: Este folleto es un resumen. Puede ser que no cubra toda la posible informacin. Si usted tiene preguntas acerca de esta medicina, consulte con su mdico, su farmacutico o su profesional de Technical sales engineer.  2022 Elsevier/Gold Standard (2015-01-18 00:00:00)

## 2022-01-16 NOTE — Progress Notes (Signed)
With assistance from in-person intrepeter, Pt reports pain in chest after radiation lasting approx 5 minutes that goes away on its own as well as with pain medication.

## 2022-01-16 NOTE — Progress Notes (Signed)
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  Patient receiving concurrent radiation and chemotherapy.    Met with patient today in infusion.  Patient reports that appetite is still good.  Does have some pain with first bite of food but then it gets better.  Eating 2 eggs and sausage with toast and coffee for breakfast.  Eating soups for lunch (yesterday chicken with vegetables) and 3 corn tortillas.  Dinner last night was salmon with vegetables and toast.  Eating yogurt as well.  Drinking some glucerna shake but not everyday.      Medications: reviewed  Labs: reviewed  Anthropometrics:   Weight 148 lb 1.6 oz today  147 lb on 1/31 143 lb on 1/24 147 lb on 1/12 146 lb on 12/5  Patient does not want to get above 150 lb  NUTRITION DIAGNOSIS:  Predicted sub optimal energy intake continues   INTERVENTION:  Continue glucerna or other low sugar shake.  Coupons given today Invited patient to Spanish Speaking Support Group that will meet on 2/21.  Flyer given and encouraged patient to register Encouraged patient to continue to eat well balanced meals.     MONITORING, EVALUATION, GOAL: weight trends, intake   NEXT VISIT: Tuesday, Feb 14th during infusion  Terre Hanneman B. Zenia Resides, [place], [place] Registered Dietitian [phone] (mobile)

## 2022-01-17 ENCOUNTER — Ambulatory Visit
Admission: RE | Admit: 2022-01-17 | Discharge: 2022-01-17 | Disposition: A | Discharge status: Home / Self Care | Payer: Self-pay | Source: Ambulatory Visit | Attending: Radiation Oncology | Admitting: Radiation Oncology

## 2022-01-17 DIAGNOSIS — Z510 Encounter for antineoplastic radiation therapy: Secondary | ICD-10-CM | POA: Diagnosis not present

## 2022-01-18 ENCOUNTER — Ambulatory Visit
Admission: RE | Admit: 2022-01-18 | Discharge: 2022-01-18 | Disposition: A | Discharge status: Home / Self Care | Payer: Self-pay | Source: Ambulatory Visit | Attending: Radiation Oncology | Admitting: Radiation Oncology

## 2022-01-18 ENCOUNTER — Other Ambulatory Visit: Payer: Self-pay

## 2022-01-18 DIAGNOSIS — Z510 Encounter for antineoplastic radiation therapy: Secondary | ICD-10-CM | POA: Diagnosis not present

## 2022-01-19 ENCOUNTER — Ambulatory Visit
Admission: RE | Admit: 2022-01-19 | Discharge: 2022-01-19 | Disposition: A | Discharge status: Home / Self Care | Payer: Self-pay | Source: Ambulatory Visit | Attending: Radiation Oncology | Admitting: Radiation Oncology

## 2022-01-19 DIAGNOSIS — Z510 Encounter for antineoplastic radiation therapy: Secondary | ICD-10-CM | POA: Diagnosis not present

## 2022-01-22 ENCOUNTER — Ambulatory Visit
Admission: RE | Admit: 2022-01-22 | Discharge: 2022-01-22 | Disposition: A | Discharge status: Home / Self Care | Payer: Self-pay | Source: Ambulatory Visit | Attending: Radiation Oncology | Admitting: Radiation Oncology

## 2022-01-22 DIAGNOSIS — Z510 Encounter for antineoplastic radiation therapy: Secondary | ICD-10-CM | POA: Diagnosis not present

## 2022-01-22 NOTE — Progress Notes (Signed)
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  INTERVAL HISTORY: Patient returns to clinic today for further evaluation and consideration of cycle 4 of weekly carboplatin and Taxol along with his daily XRT.  He continues to have mild discomfort with swallowing, but otherwise feels well.  His pain is well controlled this week. He has no further GI bleeds. He has no neurologic complaints.  He denies any recent fevers.  He has a fair appetite, but denies weight loss.  He has no chest pain, shortness of breath, cough, or hemoptysis.  He denies any nausea, vomiting, constipation, or diarrhea.  He has no further melena or hematochezia.  He has no urinary complaints.  Patient offers no further specific complaints today.  REVIEW OF SYSTEMS:   Review of Systems  Constitutional: Negative.  Negative for fever, malaise/fatigue and weight loss.  Respiratory: Negative.  Negative for cough, hemoptysis and shortness of breath.   Cardiovascular: Negative.  Negative for chest pain and leg swelling.  Gastrointestinal: Negative.  Negative for abdominal pain, blood in stool and melena.  Genitourinary: Negative.  Negative for dysuria.  Musculoskeletal:  Positive for back pain.  Skin: Negative.  Negative for rash.  Neurological: Negative.  Negative for dizziness, focal weakness, weakness and headaches.  Psychiatric/Behavioral: Negative.  The patient is not nervous/anxious.     As per HPI. Otherwise, a complete review of systems is negative.  PAST MEDICAL HISTORY: Past Medical History:  Diagnosis Date   Cancer ([place])    Diabetes mellitus  without complication ([place])    Hyperlipidemia    Neuromuscular disorder ([place])     PAST SURGICAL HISTORY: Past Surgical History:  Procedure Laterality Date   COLONOSCOPY WITH PROPOFOL N/A 11/14/2021   Procedure: COLONOSCOPY WITH PROPOFOL;  Surgeon: Lesly Rubenstein, MD;  Location: [facility] ENDOSCOPY;  Service: Endoscopy;  Laterality: N/A;   ESOPHAGOGASTRODUODENOSCOPY (EGD) WITH PROPOFOL N/A 11/14/2021   Procedure: ESOPHAGOGASTRODUODENOSCOPY (EGD) WITH PROPOFOL;  Surgeon: Lesly Rubenstein, MD;  Location: [facility] ENDOSCOPY;  Service: Endoscopy;  Laterality: N/A;   EUS N/A 12/14/2021   Procedure: FULL UPPER ENDOSCOPIC ULTRASOUND (EUS) RADIAL;  Surgeon: Holly Bodily, MD;  Location: [facility] ENDOSCOPY;  Service: Gastroenterology;  Laterality: N/A;  SPANISH INTERPRETER    FAMILY HISTORY: Family History  Problem Relation Age of Onset   Hypertension Mother     ADVANCED DIRECTIVES (Y/N):  N  HEALTH MAINTENANCE: Social History   Tobacco Use   Smoking status: Never   Smokeless tobacco: Never  Vaping Use   Vaping Use: Never used  Substance Use Topics   Alcohol use: Yes    Comment: occassionallh   Drug use: Never     Colonoscopy:  PAP:  Bone density:  Lipid panel:  No Known Allergies  Current Outpatient Medications  Medication Sig Dispense Refill   lidocaine-prilocaine (EMLA) cream Apply to affected area once 30 g 3   metFORMIN (GLUCOPHAGE) 1000 MG tablet Take 1,000 mg by mouth daily.     ondansetron (ZOFRAN) 4 MG tablet Take 2 tablets (8 mg total) by mouth 2 (two) times daily as needed for refractory nausea / vomiting. 120 tablet 1   oxyCODONE (OXY IR/ROXICODONE) 5 MG immediate release tablet  Take 10 mg by mouth every 6 (six) hours as needed for pain.     prochlorperazine (COMPAZINE) 10 MG tablet Take 1 tablet (10 mg total) by mouth every 6 (six) hours as needed (Nausea or vomiting). 60 tablet 2   atorvastatin (LIPITOR) 40 MG tablet TOMA UNA TABLETA POR BOCA AL ACOSTARSE PARA  COLESTEROL ALTO 90 tablet 3   glipiZIDE (GLUCOTROL XL) 5 MG 24 hr tablet TOME 1 TABLETA POR BOCA DIARIO PARA LA DIABETES 90 tablet 1   pantoprazole (PROTONIX) 40 MG tablet Take 1 tablet (40 mg total) by mouth daily. 30 tablet 0   sucralfate (CARAFATE) 1 GM/10ML suspension Take 10 mLs (1 g total) by mouth 4 (four) times daily -  with meals and at bedtime. 1200 mL 0   No current facility-administered medications for this visit.   Facility-Administered Medications Ordered in Other Visits  Medication Dose Route Frequency Provider Last Rate Last Admin   CARBOplatin (PARAPLATIN) 240 mg in sodium chloride 0.9 % 250 mL chemo infusion  240 mg Intravenous Once Lloyd Huger, MD       dexamethasone (DECADRON) 10 mg in sodium chloride 0.9 % 50 mL IVPB  10 mg Intravenous Once Lloyd Huger, MD       famotidine (PEPCID) IVPB 20 mg premix  20 mg Intravenous Once Lloyd Huger, MD       PACLitaxel (TAXOL) 90 mg in sodium chloride 0.9 % 250 mL chemo infusion (</= 80mg /m2)  50 mg/m2 (Treatment Plan Recorded) Intravenous Once Lloyd Huger, MD        OBJECTIVE: Vitals:   01/23/22 0853  BP: 105/65  Pulse: 65  Resp: 16  Temp: 97.8 F (36.6 C)  SpO2: 99%       Body mass index is 23.45 kg/m.    ECOG FS:0 - Asymptomatic  General: Well-developed, well-nourished, no acute distress. Eyes: Pink conjunctiva, anicteric sclera. HEENT: Normocephalic, moist mucous membranes. Lungs: No audible wheezing or coughing. Heart: Regular rate and rhythm. Abdomen: Soft, nontender, no obvious distention. Musculoskeletal: No edema, cyanosis, or clubbing. Neuro: Alert, answering all questions appropriately. Cranial nerves grossly intact. Skin: No rashes or petechiae noted. Psych: Normal affect.  LAB RESULTS:  Lab Results  Component Value Date   NA 135 01/23/2022   K 3.7 01/23/2022   CL 103 01/23/2022   CO2 27 01/23/2022   GLUCOSE 136 (H) 01/23/2022   BUN 16 01/23/2022   CREATININE 0.69  01/23/2022   CALCIUM 9.0 01/23/2022   PROT 7.2 01/23/2022   ALBUMIN 4.0 01/23/2022   AST 15 01/23/2022   ALT 17 01/23/2022   ALKPHOS 57 01/23/2022   BILITOT 0.3 01/23/2022   GFRNONAA >60 01/23/2022    Lab Results  Component Value Date   WBC 2.3 (L) 01/23/2022   NEUTROABS 1.5 (L) 01/23/2022   HGB 10.9 (L) 01/23/2022   HCT 34.4 (L) 01/23/2022   MCV 89.6 01/23/2022   PLT 158 01/23/2022   Lab Results  Component Value Date   IRON 26 (L) 12/21/2021   TIBC 325 12/21/2021   IRONPCTSAT 8 (L) 12/21/2021   Lab Results  Component Value Date   FERRITIN 15 (L) 12/21/2021     STUDIES: No results found.  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  PET scan results from December 13, 2021 reviewed independently and report as above with no obvious  metastatic disease outside of patient's known esophageal mass.  EUS completed on December 14, 2021 confirmed stage.  Patient had consultation with radiation oncology today as well.  We discussed possible port placement, but patient declined.  He will benefit from concurrent chemotherapy and XRT using weekly carboplatinum and Taxol.  This will be followed by surgical resection, likely at [facility].  Patient will complete XRT on February 12, 2022.  Proceed with cycle 4 of carboplatinum and Taxol today.  Return to clinic in 1 week for further evaluation and consideration of cycle 5.   Iron deficiency anemia: Chronic and unchanged.  Patient's hemoglobin is stable at 10.9.  He last received IV Venofer on January 16, 2022.   Pain: Patient is seen regularly by [facility] pain clinic and receives narcotics from them.  Continue follow-up as scheduled.   Appetite: Patient reports his weight is stable and he is able to tolerate p.o. intake.  Monitor.  Consider referral to dietary if  needed. Neutropenia: Mild.  Patient's ANC is 1.5 today.  Proceed with treatment as planned.   Patient expressed understanding and was in agreement with this plan. He also understands that He can call clinic at any time with any questions, concerns, or complaints.    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

## 2022-01-23 ENCOUNTER — Inpatient Hospital Stay: Payer: Self-pay

## 2022-01-23 ENCOUNTER — Ambulatory Visit
Admission: RE | Admit: 2022-01-23 | Discharge: 2022-01-23 | Disposition: A | Discharge status: Home / Self Care | Payer: Self-pay | Source: Ambulatory Visit | Attending: Radiation Oncology | Admitting: Radiation Oncology

## 2022-01-23 ENCOUNTER — Inpatient Hospital Stay (HOSPITAL_BASED_OUTPATIENT_CLINIC_OR_DEPARTMENT_OTHER): Payer: Self-pay | Admitting: Oncology

## 2022-01-23 ENCOUNTER — Other Ambulatory Visit: Payer: Self-pay

## 2022-01-23 VITALS — BP 105/65 | HR 65 | Temp 97.8°F | Resp 16 | Ht 66.0 in | Wt 145.3 lb

## 2022-01-23 DIAGNOSIS — Z510 Encounter for antineoplastic radiation therapy: Secondary | ICD-10-CM | POA: Diagnosis not present

## 2022-01-23 DIAGNOSIS — C159 Malignant neoplasm of esophagus, unspecified: Secondary | ICD-10-CM

## 2022-01-23 LAB — CBC WITH DIFFERENTIAL/PLATELET
Abs Immature Granulocytes: 0.02 10*3/uL (ref 0.00–0.07)
Basophils Absolute: 0 10*3/uL (ref 0.0–0.1)
Basophils Relative: 1 %
Eosinophils Absolute: 0.1 10*3/uL (ref 0.0–0.5)
Eosinophils Relative: 4 %
HCT: 34.4 % — ABNORMAL LOW (ref 39.0–52.0)
Hemoglobin: 10.9 g/dL — ABNORMAL LOW (ref 13.0–17.0)
Immature Granulocytes: 1 %
Lymphocytes Relative: 19 %
Lymphs Abs: 0.4 10*3/uL — ABNORMAL LOW (ref 0.7–4.0)
MCH: 28.4 pg (ref 26.0–34.0)
MCHC: 31.7 g/dL (ref 30.0–36.0)
MCV: 89.6 fL (ref 80.0–100.0)
Monocytes Absolute: 0.2 10*3/uL (ref 0.1–1.0)
Monocytes Relative: 11 %
Neutro Abs: 1.5 10*3/uL — ABNORMAL LOW (ref 1.7–7.7)
Neutrophils Relative %: 64 %
Platelets: 158 10*3/uL (ref 150–400)
RBC: 3.84 MIL/uL — ABNORMAL LOW (ref 4.22–5.81)
RDW: 18.6 % — ABNORMAL HIGH (ref 11.5–15.5)
WBC: 2.3 10*3/uL — ABNORMAL LOW (ref 4.0–10.5)
nRBC: 0 % (ref 0.0–0.2)

## 2022-01-23 LAB — COMPREHENSIVE METABOLIC PANEL
ALT: 17 U/L (ref 0–44)
AST: 15 U/L (ref 15–41)
Albumin: 4 g/dL (ref 3.5–5.0)
Alkaline Phosphatase: 57 U/L (ref 38–126)
Anion gap: 5 (ref 5–15)
BUN: 16 mg/dL (ref 6–20)
CO2: 27 mmol/L (ref 22–32)
Calcium: 9 mg/dL (ref 8.9–10.3)
Chloride: 103 mmol/L (ref 98–111)
Creatinine, Ser: 0.69 mg/dL (ref 0.61–1.24)
GFR, Estimated: >60 mL/min (ref >60)
Glucose, Bld: 136 mg/dL — ABNORMAL HIGH (ref 70–99)
Potassium: 3.7 mmol/L (ref 3.5–5.1)
Sodium: 135 mmol/L (ref 135–145)
Total Bilirubin: 0.3 mg/dL (ref 0.3–1.2)
Total Protein: 7.2 g/dL (ref 6.5–8.1)

## 2022-01-23 MED ORDER — SODIUM CHLORIDE 0.9 % IV SOLN
10.0000 mg | Freq: Once | INTRAVENOUS | Status: AC
  Administered 2022-01-23: 10 mg via INTRAVENOUS
  Filled 2022-01-23: qty 10

## 2022-01-23 MED ORDER — DIPHENHYDRAMINE HCL 50 MG/ML IJ SOLN
25.0000 mg | Freq: Once | INTRAMUSCULAR | Status: AC
  Administered 2022-01-23: 25 mg via INTRAVENOUS
  Filled 2022-01-23: qty 1

## 2022-01-23 MED ORDER — SODIUM CHLORIDE 0.9 % IV SOLN
Freq: Once | INTRAVENOUS | Status: AC
  Filled 2022-01-23: qty 250

## 2022-01-23 MED ORDER — SODIUM CHLORIDE 0.9 % IV SOLN
50.0000 mg/m2 | Freq: Once | INTRAVENOUS | Status: AC
  Administered 2022-01-23: 90 mg via INTRAVENOUS
  Filled 2022-01-23: qty 15

## 2022-01-23 MED ORDER — FAMOTIDINE IN NACL 20-0.9 MG/50ML-% IV SOLN
20.0000 mg | Freq: Once | INTRAVENOUS | Status: AC
  Administered 2022-01-23: 20 mg via INTRAVENOUS
  Filled 2022-01-23: qty 50

## 2022-01-23 MED ORDER — SODIUM CHLORIDE 0.9 % IV SOLN
242.2000 mg | Freq: Once | INTRAVENOUS | Status: AC
  Administered 2022-01-23: 240 mg via INTRAVENOUS
  Filled 2022-01-23: qty 24

## 2022-01-23 MED ORDER — PALONOSETRON HCL INJECTION 0.25 MG/5ML
0.2500 mg | Freq: Once | INTRAVENOUS | Status: AC
  Administered 2022-01-23: 0.25 mg via INTRAVENOUS
  Filled 2022-01-23: qty 5

## 2022-01-23 NOTE — Patient Instructions (Signed)
[facility] [place]  Discharge Instructions: °Thank you for choosing [facility] to provide your oncology and hematology care.  °If you have a lab appointment with the Cancer Center, please go directly to the Cancer Center and check in at the registration area. ° °Wear comfortable clothing and clothing appropriate for easy access to any Portacath or PICC line.  ° °We strive to give you quality time with your provider. You may need to reschedule your appointment if you arrive late (15 or more minutes).  Arriving late affects you and other patients whose appointments are after yours.  Also, if you miss three or more appointments without notifying the office, you may be dismissed from the clinic at the provider’s discretion.    °  °For prescription refill requests, have your pharmacy contact our office and allow 72 hours for refills to be completed.   ° °Today you received the following chemotherapy and/or immunotherapy agents : Taxol / Carboplatin   °  °To help prevent nausea and vomiting after your treatment, we encourage you to take your nausea medication as directed. ° °BELOW ARE SYMPTOMS THAT SHOULD BE REPORTED IMMEDIATELY: °*FEVER GREATER THAN 100.4 F (38 °C) OR HIGHER °*CHILLS OR SWEATING °*NAUSEA AND VOMITING THAT IS NOT CONTROLLED WITH YOUR NAUSEA MEDICATION °*UNUSUAL SHORTNESS OF BREATH °*UNUSUAL BRUISING OR BLEEDING °*URINARY PROBLEMS (pain or burning when urinating, or frequent urination) °*BOWEL PROBLEMS (unusual diarrhea, constipation, pain near the anus) °TENDERNESS IN MOUTH AND THROAT WITH OR WITHOUT PRESENCE OF ULCERS (sore throat, sores in mouth, or a toothache) °UNUSUAL RASH, SWELLING OR PAIN  °UNUSUAL VAGINAL DISCHARGE OR ITCHING  ° °Items with * indicate a potential emergency and should be followed up as soon as possible or go to the Emergency Department if any problems should occur. ° °Please show the CHEMOTHERAPY ALERT CARD or IMMUNOTHERAPY ALERT CARD at  check-in to the Emergency Department and triage nurse. ° °Should you have questions after your visit or need to cancel or reschedule your appointment, please contact [facility] [place]  [phone] and follow the prompts.  Office hours are 8:00 a.m. to 4:30 p.m. Monday - Friday. Please note that voicemails left after 4:00 p.m. may not be returned until the following business day.  We are closed weekends and major holidays. You have access to a nurse at all times for urgent questions. Please call the main number to the clinic [phone] and follow the prompts. ° °For any non-urgent questions, you may also contact your provider using MyChart. We now offer e-Visits for anyone 18 and older to request care online for non-urgent symptoms. For details visit [email or web address]. °  °Also download the MyChart app! Go to the app store, search "MyChart", open the app, select [place], and log in with your MyChart username and password. ° °Due to Covid, a mask is required upon entering the hospital/clinic. If you do not have a mask, one will be given to you upon arrival. For doctor visits, patients may have 1 support person aged 18 or older with them. For treatment visits, patients cannot have anyone with them due to current Covid guidelines and our immunocompromised population.  °

## 2022-01-23 NOTE — Progress Notes (Signed)
Nutrition Follow-up:   Patient with stage III poorly differentiated squamous cell carcinoma of esophagus.  Patient receiving concurrent radiation and chemotherapy.    Met with patient today in infusion, interpreter present.  Patient reports that appetite is still good, watching what he is eating.  Eating mostly chicken, fish, vegetables, limiting carbs for blood sugar control.  Drinking glucerna about 1 time per day.  Denies trouble swallowing. Reports some nausea.  Says that he has increased his activity.     Medications: zofran, compazine  Labs: reviewed  Anthropometrics:   Weight 145 lb 4.8 oz today  148 lb 1.6 oz on 2/7 147 lb on 1/31 143 lb on 1/24 147 lb on 1/12 146 lb on 12/5   NUTRITION DIAGNOSIS: Predicted suboptimal energy intake continues    INTERVENTION:  Recommend increase glucerna shake to BID for added calories and protein. Samples and coupons given today Continue foods high in protein    MONITORING, EVALUATION, GOAL: weight trends, intake   NEXT VISIT: Tuesday, Feb 21 during infusion  Viral Schramm B. Zenia Resides, [place], [place] Registered Dietitian [phone] (mobile)

## 2022-01-24 ENCOUNTER — Ambulatory Visit
Admission: RE | Admit: 2022-01-24 | Discharge: 2022-01-24 | Disposition: A | Discharge status: Home / Self Care | Payer: Self-pay | Source: Ambulatory Visit | Attending: Radiation Oncology | Admitting: Radiation Oncology

## 2022-01-24 DIAGNOSIS — Z510 Encounter for antineoplastic radiation therapy: Secondary | ICD-10-CM | POA: Diagnosis not present

## 2022-01-25 ENCOUNTER — Ambulatory Visit
Admission: RE | Admit: 2022-01-25 | Discharge: 2022-01-25 | Disposition: A | Discharge status: Home / Self Care | Payer: Self-pay | Source: Ambulatory Visit | Attending: Radiation Oncology | Admitting: Radiation Oncology

## 2022-01-25 DIAGNOSIS — Z510 Encounter for antineoplastic radiation therapy: Secondary | ICD-10-CM | POA: Diagnosis not present

## 2022-01-26 ENCOUNTER — Ambulatory Visit
Admission: RE | Admit: 2022-01-26 | Discharge: 2022-01-26 | Disposition: A | Discharge status: Home / Self Care | Payer: Self-pay | Source: Ambulatory Visit | Attending: Radiation Oncology | Admitting: Radiation Oncology

## 2022-01-26 DIAGNOSIS — Z510 Encounter for antineoplastic radiation therapy: Secondary | ICD-10-CM | POA: Diagnosis not present

## 2022-01-29 ENCOUNTER — Ambulatory Visit
Admission: RE | Admit: 2022-01-29 | Discharge: 2022-01-29 | Disposition: A | Discharge status: Home / Self Care | Payer: Self-pay | Source: Ambulatory Visit | Attending: Radiation Oncology | Admitting: Radiation Oncology

## 2022-01-29 DIAGNOSIS — Z510 Encounter for antineoplastic radiation therapy: Secondary | ICD-10-CM | POA: Diagnosis not present

## 2022-01-29 NOTE — Progress Notes (Signed)
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  INTERVAL HISTORY: Patient returns to clinic today for further evaluation and consideration of cycle 5 of weekly carboplatin and Taxol along with his daily XRT.  He has some mild dysphagia, but otherwise is tolerating his treatments well.  His pain is well controlled this week. He has no further GI bleeds. He has no neurologic complaints.  He denies any recent fevers.  He has a fair appetite, but denies weight loss.  He has no chest pain, shortness of breath, cough, or hemoptysis.  He denies any nausea, vomiting, constipation, or diarrhea.  He has no further melena or hematochezia.  He has no urinary complaints.  Patient offers no further specific complaints today.  REVIEW OF SYSTEMS:   Review of Systems  Constitutional: Negative.  Negative for fever, malaise/fatigue and weight loss.  Respiratory: Negative.  Negative for cough, hemoptysis and shortness of breath.   Cardiovascular: Negative.  Negative for chest pain and leg swelling.  Gastrointestinal: Negative.  Negative for abdominal pain, blood in stool and melena.  Genitourinary: Negative.  Negative for dysuria.  Musculoskeletal:  Positive for back pain.  Skin: Negative.  Negative for rash.  Neurological: Negative.  Negative for dizziness, focal weakness, weakness and headaches.  Psychiatric/Behavioral: Negative.  The patient is not nervous/anxious.     As per HPI. Otherwise, a complete review of systems is negative.  PAST MEDICAL HISTORY: Past Medical History:  Diagnosis Date   Cancer ([place])    Diabetes mellitus  without complication ([place])    Hyperlipidemia    Neuromuscular disorder ([place])     PAST SURGICAL HISTORY: Past Surgical History:  Procedure Laterality Date   COLONOSCOPY WITH PROPOFOL N/A 11/14/2021   Procedure: COLONOSCOPY WITH PROPOFOL;  Surgeon: Lesly Rubenstein, MD;  Location: [facility] ENDOSCOPY;  Service: Endoscopy;  Laterality: N/A;   ESOPHAGOGASTRODUODENOSCOPY (EGD) WITH PROPOFOL N/A 11/14/2021   Procedure: ESOPHAGOGASTRODUODENOSCOPY (EGD) WITH PROPOFOL;  Surgeon: Lesly Rubenstein, MD;  Location: [facility] ENDOSCOPY;  Service: Endoscopy;  Laterality: N/A;   EUS N/A 12/14/2021   Procedure: FULL UPPER ENDOSCOPIC ULTRASOUND (EUS) RADIAL;  Surgeon: Holly Bodily, MD;  Location: [facility] ENDOSCOPY;  Service: Gastroenterology;  Laterality: N/A;  SPANISH INTERPRETER    FAMILY HISTORY: Family History  Problem Relation Age of Onset   Hypertension Mother     ADVANCED DIRECTIVES (Y/N):  N  HEALTH MAINTENANCE: Social History   Tobacco Use   Smoking status: Never   Smokeless tobacco: Never  Vaping Use   Vaping Use: Never used  Substance Use Topics   Alcohol use: Yes    Comment: occassionallh   Drug use: Never     Colonoscopy:  PAP:  Bone density:  Lipid panel:  No Known Allergies  Current Outpatient Medications  Medication Sig Dispense Refill   atorvastatin (LIPITOR) 40 MG tablet TOMA UNA TABLETA POR BOCA AL ACOSTARSE PARA COLESTEROL ALTO 90 tablet 3   glipiZIDE (GLUCOTROL XL) 5 MG 24 hr tablet TOME 1 TABLETA POR BOCA DIARIO PARA LA DIABETES 90 tablet 1   lidocaine-prilocaine (EMLA) cream Apply to affected area once 30 g 3   metFORMIN (GLUCOPHAGE) 1000 MG tablet Take 1,000 mg by mouth  daily.     ondansetron (ZOFRAN) 4 MG tablet Take 2 tablets (8 mg total) by mouth 2 (two) times daily as needed for refractory nausea / vomiting. 120 tablet 1   oxyCODONE (OXY IR/ROXICODONE) 5 MG immediate release tablet Take 10 mg by mouth every 6 (six) hours as needed for pain.      prochlorperazine (COMPAZINE) 10 MG tablet Take 1 tablet (10 mg total) by mouth every 6 (six) hours as needed (Nausea or vomiting). 60 tablet 2   pantoprazole (PROTONIX) 40 MG tablet Take 1 tablet (40 mg total) by mouth daily. (Patient not taking: Reported on 01/30/2022) 30 tablet 0   sucralfate (CARAFATE) 1 GM/10ML suspension Take 10 mLs (1 g total) by mouth 4 (four) times daily -  with meals and at bedtime. (Patient not taking: Reported on 01/30/2022) 1200 mL 0   No current facility-administered medications for this visit.   Facility-Administered Medications Ordered in Other Visits  Medication Dose Route Frequency Provider Last Rate Last Admin   CARBOplatin (PARAPLATIN) 240 mg in sodium chloride 0.9 % 250 mL chemo infusion  240 mg Intravenous Once Lloyd Huger, MD       PACLitaxel (TAXOL) 90 mg in sodium chloride 0.9 % 250 mL chemo infusion (</= 80mg /m2)  50 mg/m2 (Treatment Plan Recorded) Intravenous Once Lloyd Huger, MD 265 mL/hr at 01/30/22 1107 90 mg at 01/30/22 1107    OBJECTIVE: Vitals:   01/30/22 0902  BP: (!) 135/55  Pulse: 68  Resp: 16  Temp: (!) 97.3 F (36.3 C)       Body mass index is 23.31 kg/m.    ECOG FS:0 - Asymptomatic  General: Well-developed, well-nourished, no acute distress. Eyes: Pink conjunctiva, anicteric sclera. HEENT: Normocephalic, moist mucous membranes. Lungs: No audible wheezing or coughing. Heart: Regular rate and rhythm. Abdomen: Soft, nontender, no obvious distention. Musculoskeletal: No edema, cyanosis, or clubbing. Neuro: Alert, answering all questions appropriately. Cranial nerves grossly intact. Skin: No rashes or petechiae noted. Psych: Normal affect.  LAB RESULTS:  Lab Results  Component Value Date   NA 137 01/30/2022   K 4.0 01/30/2022   CL 103 01/30/2022   CO2 28 01/30/2022   GLUCOSE 125 (H) 01/30/2022   BUN 15 01/30/2022   CREATININE 0.77 01/30/2022   CALCIUM 9.2 01/30/2022   PROT 6.9 01/30/2022   ALBUMIN 4.1  01/30/2022   AST 16 01/30/2022   ALT 17 01/30/2022   ALKPHOS 55 01/30/2022   BILITOT 0.6 01/30/2022   GFRNONAA >60 01/30/2022    Lab Results  Component Value Date   WBC 2.7 (L) 01/30/2022   NEUTROABS 2.0 01/30/2022   HGB 11.1 (L) 01/30/2022   HCT 34.6 (L) 01/30/2022   MCV 88.9 01/30/2022   PLT 128 (L) 01/30/2022   Lab Results  Component Value Date   IRON 26 (L) 12/21/2021   TIBC 325 12/21/2021   IRONPCTSAT 8 (L) 12/21/2021   Lab Results  Component Value Date   FERRITIN 15 (L) 12/21/2021     STUDIES: No results found.  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  PET scan results from December 13, 2021 reviewed independently and report as above with no obvious metastatic disease outside of patient's known esophageal mass.  EUS completed on December 14, 2021 confirmed stage.  We discussed possible port placement, but patient declined.  He will benefit from  concurrent chemotherapy and XRT using weekly carboplatinum and Taxol.  This will be followed by surgical resection, [facility] at [facility].  Patient will complete XRT on February 12, 2022.  Proceed with cycle 5 of carboplatinum and Taxol today.  Return to clinic in 1 week for further evaluation and consideration of cycle 6.   Iron deficiency anemia: Chronic and unchanged.  Patient's hemoglobin stable at 11.1.  He last received IV Venofer on January 16, 2022.   Pain: Patient is seen regularly by [facility] pain clinic and receives narcotics from them.  Continue follow-up as scheduled.   Appetite: Patient reports his weight is stable and he is able to tolerate p.o. intake.  Monitor.  Consider referral to dietary if needed. Neutropenia: Resolved.  Patient's ANC is 2.0 today.  Patient expressed understanding and was in agreement with this plan. He also understands that  He can call clinic at any time with any questions, concerns, or complaints.    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

## 2022-01-30 ENCOUNTER — Inpatient Hospital Stay (HOSPITAL_BASED_OUTPATIENT_CLINIC_OR_DEPARTMENT_OTHER): Payer: Self-pay | Admitting: Oncology

## 2022-01-30 ENCOUNTER — Inpatient Hospital Stay: Payer: Self-pay

## 2022-01-30 ENCOUNTER — Ambulatory Visit
Admission: RE | Admit: 2022-01-30 | Discharge: 2022-01-30 | Disposition: A | Discharge status: Home / Self Care | Payer: Self-pay | Source: Ambulatory Visit | Attending: Radiation Oncology | Admitting: Radiation Oncology

## 2022-01-30 ENCOUNTER — Other Ambulatory Visit: Payer: Self-pay

## 2022-01-30 ENCOUNTER — Encounter: Payer: Self-pay | Admitting: Oncology

## 2022-01-30 VITALS — BP 135/55 | HR 68 | Temp 97.3°F | Resp 16 | Wt 144.4 lb

## 2022-01-30 DIAGNOSIS — Z510 Encounter for antineoplastic radiation therapy: Secondary | ICD-10-CM | POA: Diagnosis not present

## 2022-01-30 DIAGNOSIS — C159 Malignant neoplasm of esophagus, unspecified: Secondary | ICD-10-CM

## 2022-01-30 LAB — CBC WITH DIFFERENTIAL/PLATELET
Abs Immature Granulocytes: 0.01 10*3/uL (ref 0.00–0.07)
Basophils Absolute: 0 10*3/uL (ref 0.0–0.1)
Basophils Relative: 1 %
Eosinophils Absolute: 0.1 10*3/uL (ref 0.0–0.5)
Eosinophils Relative: 3 %
HCT: 34.6 % — ABNORMAL LOW (ref 39.0–52.0)
Hemoglobin: 11.1 g/dL — ABNORMAL LOW (ref 13.0–17.0)
Immature Granulocytes: 0 %
Lymphocytes Relative: 12 %
Lymphs Abs: 0.3 10*3/uL — ABNORMAL LOW (ref 0.7–4.0)
MCH: 28.5 pg (ref 26.0–34.0)
MCHC: 32.1 g/dL (ref 30.0–36.0)
MCV: 88.9 fL (ref 80.0–100.0)
Monocytes Absolute: 0.3 10*3/uL (ref 0.1–1.0)
Monocytes Relative: 11 %
Neutro Abs: 2 10*3/uL (ref 1.7–7.7)
Neutrophils Relative %: 73 %
Platelets: 128 10*3/uL — ABNORMAL LOW (ref 150–400)
RBC: 3.89 MIL/uL — ABNORMAL LOW (ref 4.22–5.81)
RDW: 19.9 % — ABNORMAL HIGH (ref 11.5–15.5)
WBC: 2.7 10*3/uL — ABNORMAL LOW (ref 4.0–10.5)
nRBC: 0 % (ref 0.0–0.2)

## 2022-01-30 LAB — COMPREHENSIVE METABOLIC PANEL
ALT: 17 U/L (ref 0–44)
AST: 16 U/L (ref 15–41)
Albumin: 4.1 g/dL (ref 3.5–5.0)
Alkaline Phosphatase: 55 U/L (ref 38–126)
Anion gap: 6 (ref 5–15)
BUN: 15 mg/dL (ref 6–20)
CO2: 28 mmol/L (ref 22–32)
Calcium: 9.2 mg/dL (ref 8.9–10.3)
Chloride: 103 mmol/L (ref 98–111)
Creatinine, Ser: 0.77 mg/dL (ref 0.61–1.24)
GFR, Estimated: >60 mL/min (ref >60)
Glucose, Bld: 125 mg/dL — ABNORMAL HIGH (ref 70–99)
Potassium: 4 mmol/L (ref 3.5–5.1)
Sodium: 137 mmol/L (ref 135–145)
Total Bilirubin: 0.6 mg/dL (ref 0.3–1.2)
Total Protein: 6.9 g/dL (ref 6.5–8.1)

## 2022-01-30 MED ORDER — SODIUM CHLORIDE 0.9 % IV SOLN
50.0000 mg/m2 | Freq: Once | INTRAVENOUS | Status: AC
  Administered 2022-01-30: 90 mg via INTRAVENOUS
  Filled 2022-01-30: qty 15

## 2022-01-30 MED ORDER — SODIUM CHLORIDE 0.9 % IV SOLN
242.2000 mg | Freq: Once | INTRAVENOUS | Status: AC
  Administered 2022-01-30: 240 mg via INTRAVENOUS
  Filled 2022-01-30: qty 24

## 2022-01-30 MED ORDER — SODIUM CHLORIDE 0.9 % IV SOLN
Freq: Once | INTRAVENOUS | Status: AC
  Filled 2022-01-30: qty 250

## 2022-01-30 MED ORDER — DIPHENHYDRAMINE HCL 50 MG/ML IJ SOLN
25.0000 mg | Freq: Once | INTRAMUSCULAR | Status: AC
  Administered 2022-01-30: 25 mg via INTRAVENOUS
  Filled 2022-01-30: qty 1

## 2022-01-30 MED ORDER — FAMOTIDINE IN NACL 20-0.9 MG/50ML-% IV SOLN
20.0000 mg | Freq: Once | INTRAVENOUS | Status: AC
  Administered 2022-01-30: 20 mg via INTRAVENOUS
  Filled 2022-01-30: qty 50

## 2022-01-30 MED ORDER — SODIUM CHLORIDE 0.9 % IV SOLN
10.0000 mg | Freq: Once | INTRAVENOUS | Status: AC
  Administered 2022-01-30: 10 mg via INTRAVENOUS
  Filled 2022-01-30: qty 10

## 2022-01-30 MED ORDER — PALONOSETRON HCL INJECTION 0.25 MG/5ML
0.2500 mg | Freq: Once | INTRAVENOUS | Status: AC
  Administered 2022-01-30: 0.25 mg via INTRAVENOUS
  Filled 2022-01-30: qty 5

## 2022-01-30 NOTE — Progress Notes (Signed)
Nutrition Follow-up:   Patient with stage III poorly differentiated squamous cell carcinoma of esophagus.  Patient receiving concurrent radiation and chemotherapy.    Met with patient today in infusion.  Patient reports that his appetite is the same and he is eating 3 meals per day and drinking 2 glucerna shakes.  Denies trouble swallowing, some pain but does not keep him from eating.      Medications: reviewed  Labs: reviewed  Anthropometrics:   Weight 144 lb 6.4 oz today  145 lb 4.8 oz on 2/14 148 lb 1.6 oz on 2/7 147 lb on 1/31 143 lb on 1/24 147 lb on 1/12 146 lb on 12/5   NUTRITION DIAGNOSIS: Predicted sub optimal energy intake continues   INTERVENTION:  Continue glucerna shake BID. Coupons given today.  Provided written list of other shakes low in sugar for patient. Encouraged high calorie and protein to prevent weight loss.  "I will try and eat more."    MONITORING, EVALUATION, GOAL: weight trends, intake   NEXT VISIT: Tuesday, Feb 28 during infusion  Ignacio Lowder B. Zenia Resides, [place], [place] Registered Dietitian [phone] (mobile)

## 2022-01-30 NOTE — Patient Instructions (Signed)
[facility] CANCER CTR AT [place]  Discharge Instructions: Thank you for choosing [facility] to provide your oncology and hematology care.  If you have a lab appointment with the [facility], please go directly to the [facility] and check in at the registration area.  Wear comfortable clothing and clothing appropriate for easy access to any Portacath or PICC line.   We strive to give you quality time with your provider. You may need to reschedule your appointment if you arrive late (15 or more minutes).  Arriving late affects you and other patients whose appointments are after yours.  Also, if you miss three or more appointments without notifying the office, you may be dismissed from the clinic at the providers discretion.      For prescription refill requests, have your pharmacy contact our office and allow 72 hours for refills to be completed.    Today you received the following chemotherapy and/or immunotherapy agents Taxol and Carboplatin       To help prevent nausea and vomiting after your treatment, we encourage you to take your nausea medication as directed.  BELOW ARE SYMPTOMS THAT SHOULD BE REPORTED IMMEDIATELY: *FEVER GREATER THAN 100.4 F (38 C) OR HIGHER *CHILLS OR SWEATING *NAUSEA AND VOMITING THAT IS NOT CONTROLLED WITH YOUR NAUSEA MEDICATION *UNUSUAL SHORTNESS OF BREATH *UNUSUAL BRUISING OR BLEEDING *URINARY PROBLEMS (pain or burning when urinating, or frequent urination) *BOWEL PROBLEMS (unusual diarrhea, constipation, pain near the anus) TENDERNESS IN MOUTH AND THROAT WITH OR WITHOUT PRESENCE OF ULCERS (sore throat, sores in mouth, or a toothache) UNUSUAL RASH, SWELLING OR PAIN  UNUSUAL VAGINAL DISCHARGE OR ITCHING   Items with * indicate a potential emergency and should be followed up as soon as possible or go to the Emergency Department if any problems should occur.  Please show the CHEMOTHERAPY ALERT CARD or IMMUNOTHERAPY ALERT CARD at  check-in to the Emergency Department and triage nurse.  Should you have questions after your visit or need to cancel or reschedule your appointment, please contact [facility] CANCER [place] AT [place]  [phone] and follow the prompts.  Office hours are 8:00 a.m. to 4:30 p.m. Monday - Friday. Please note that voicemails left after 4:00 p.m. may not be returned until the following business day.  We are closed weekends and major holidays. You have access to a nurse at all times for urgent questions. Please call the main number to the clinic [phone] and follow the prompts.  For any non-urgent questions, you may also contact your provider using MyChart. We now offer e-Visits for anyone 56 and older to request care online for non-urgent symptoms. For details visit [email or web address].   Also download the MyChart app! Go to the app store, search "MyChart", open the app, select [facility], and log in with your MyChart username and password.  Due to Covid, a mask is required upon entering the hospital/clinic. If you do not have a mask, one will be given to you upon arrival. For doctor visits, patients may have 1 support person aged 50 or older with them. For treatment visits, patients cannot have anyone with them due to current Covid guidelines and our immunocompromised population.

## 2022-01-30 NOTE — Progress Notes (Signed)
Referral sent to [facility] thoracic surgery.

## 2022-01-30 NOTE — Progress Notes (Signed)
Patient here for follow up he has no questions or concerns he states he has been feeling ok.

## 2022-01-31 ENCOUNTER — Ambulatory Visit
Admission: RE | Admit: 2022-01-31 | Discharge: 2022-01-31 | Disposition: A | Discharge status: Home / Self Care | Payer: Self-pay | Source: Ambulatory Visit | Attending: Radiation Oncology | Admitting: Radiation Oncology

## 2022-01-31 DIAGNOSIS — Z510 Encounter for antineoplastic radiation therapy: Secondary | ICD-10-CM | POA: Diagnosis not present

## 2022-02-01 ENCOUNTER — Ambulatory Visit
Admission: RE | Admit: 2022-02-01 | Discharge: 2022-02-01 | Disposition: A | Discharge status: Home / Self Care | Payer: Self-pay | Source: Ambulatory Visit | Attending: Radiation Oncology | Admitting: Radiation Oncology

## 2022-02-01 DIAGNOSIS — Z510 Encounter for antineoplastic radiation therapy: Secondary | ICD-10-CM | POA: Diagnosis not present

## 2022-02-02 ENCOUNTER — Ambulatory Visit
Admission: RE | Admit: 2022-02-02 | Discharge: 2022-02-02 | Disposition: A | Discharge status: Home / Self Care | Payer: Self-pay | Source: Ambulatory Visit | Attending: Radiation Oncology | Admitting: Radiation Oncology

## 2022-02-02 DIAGNOSIS — Z510 Encounter for antineoplastic radiation therapy: Secondary | ICD-10-CM | POA: Diagnosis not present

## 2022-02-02 NOTE — Progress Notes (Signed)
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  INTERVAL HISTORY: Patient returns to clinic today for further evaluation and consideration of cycle 6 of weekly carboplatinum and Taxol along with his daily XRT.  His dysphagia has increased and he was given a prescription of Carafate by radiation oncology.  He otherwise feels well.  His pain is well controlled this week. He has no further GI bleeds. He has no neurologic complaints.  He denies any recent fevers.  He has a fair appetite, but denies weight loss.  He has no chest pain, shortness of breath, cough, or hemoptysis.  He denies any nausea, vomiting, constipation, or diarrhea.  He has no further melena or hematochezia.  He has no urinary complaints.  Patient offers no further specific complaints today.  REVIEW OF SYSTEMS:   Review of Systems  Constitutional: Negative.  Negative for fever, malaise/fatigue and weight loss.  Respiratory: Negative.  Negative for cough, hemoptysis and shortness of breath.   Cardiovascular: Negative.  Negative for chest pain and leg swelling.  Gastrointestinal: Negative.  Negative for abdominal pain, blood in stool and melena.  Genitourinary: Negative.  Negative for dysuria.  Musculoskeletal:  Positive for back pain.  Skin: Negative.  Negative for rash.  Neurological: Negative.  Negative for dizziness, focal weakness, weakness and headaches.  Psychiatric/Behavioral: Negative.  The patient is not nervous/anxious.     As per HPI. Otherwise, a complete review of systems is negative.  PAST MEDICAL HISTORY: Past Medical History:  Diagnosis  Date   Cancer ([place])    Diabetes mellitus without complication ([place])    Hyperlipidemia    Neuromuscular disorder ([place])     PAST SURGICAL HISTORY: Past Surgical History:  Procedure Laterality Date   COLONOSCOPY WITH PROPOFOL N/A 11/14/2021   Procedure: COLONOSCOPY WITH PROPOFOL;  Surgeon: Lesly Rubenstein, MD;  Location: [facility] ENDOSCOPY;  Service: Endoscopy;  Laterality: N/A;   ESOPHAGOGASTRODUODENOSCOPY (EGD) WITH PROPOFOL N/A 11/14/2021   Procedure: ESOPHAGOGASTRODUODENOSCOPY (EGD) WITH PROPOFOL;  Surgeon: Lesly Rubenstein, MD;  Location: [facility] ENDOSCOPY;  Service: Endoscopy;  Laterality: N/A;   EUS N/A 12/14/2021   Procedure: FULL UPPER ENDOSCOPIC ULTRASOUND (EUS) RADIAL;  Surgeon: Holly Bodily, MD;  Location: [facility] ENDOSCOPY;  Service: Gastroenterology;  Laterality: N/A;  SPANISH INTERPRETER    FAMILY HISTORY: Family History  Problem Relation Age of Onset   Hypertension Mother     ADVANCED DIRECTIVES (Y/N):  N  HEALTH MAINTENANCE: Social History   Tobacco Use   Smoking status: Never   Smokeless tobacco: Never  Vaping Use   Vaping Use: Never used  Substance Use Topics   Alcohol use: Yes    Comment: occassionallh   Drug use: Never     Colonoscopy:  PAP:  Bone density:  Lipid panel:  No Known Allergies  Current Outpatient Medications  Medication Sig Dispense Refill   lidocaine-prilocaine (EMLA) cream Apply to affected area once 30 g 3   metFORMIN (GLUCOPHAGE) 1000 MG tablet Take 1,000 mg by mouth daily.     ondansetron (ZOFRAN) 4 MG tablet Take 2 tablets (8 mg total) by mouth 2 (two) times daily as needed for refractory nausea / vomiting. 120 tablet 1  oxyCODONE (OXY IR/ROXICODONE) 5 MG immediate release tablet Take 10 mg by mouth every 6 (six) hours as needed for pain.     prochlorperazine (COMPAZINE) 10 MG tablet Take 1 tablet (10 mg total) by mouth every 6 (six) hours as needed (Nausea or vomiting). 60 tablet 2   atorvastatin (LIPITOR) 40 MG tablet TOMA  UNA TABLETA POR BOCA AL ACOSTARSE PARA COLESTEROL ALTO 90 tablet 3   glipiZIDE (GLUCOTROL XL) 5 MG 24 hr tablet TOME 1 TABLETA POR BOCA DIARIO PARA LA DIABETES 90 tablet 1   pantoprazole (PROTONIX) 40 MG tablet Take 1 tablet (40 mg total) by mouth daily. (Patient not taking: Reported on 01/30/2022) 30 tablet 0   sucralfate (CARAFATE) 1 GM/10ML suspension Take 10 mLs (1 g total) by mouth 4 (four) times daily -  with meals and at bedtime. (Patient not taking: Reported on 01/30/2022) 1200 mL 0   No current facility-administered medications for this visit.    OBJECTIVE: Vitals:   02/06/22 0839  BP: 104/67  Pulse: 66  Resp: 18  Temp: 97.6 F (36.4 C)  SpO2: 98%       Body mass index is 23.48 kg/m.    ECOG FS:0 - Asymptomatic  General: Well-developed, well-nourished, no acute distress. Eyes: Pink conjunctiva, anicteric sclera. HEENT: Normocephalic, moist mucous membranes. Lungs: No audible wheezing or coughing. Heart: Regular rate and rhythm. Abdomen: Soft, nontender, no obvious distention. Musculoskeletal: No edema, cyanosis, or clubbing. Neuro: Alert, answering all questions appropriately. Cranial nerves grossly intact. Skin: No rashes or petechiae noted. Psych: Normal affect.   LAB RESULTS:  Lab Results  Component Value Date   NA 137 01/30/2022   K 4.0 01/30/2022   CL 103 01/30/2022   CO2 28 01/30/2022   GLUCOSE 125 (H) 01/30/2022   BUN 15 01/30/2022   CREATININE 0.77 01/30/2022   CALCIUM 9.2 01/30/2022   PROT 6.9 01/30/2022   ALBUMIN 4.1 01/30/2022   AST 16 01/30/2022   ALT 17 01/30/2022   ALKPHOS 55 01/30/2022   BILITOT 0.6 01/30/2022   GFRNONAA >60 01/30/2022    Lab Results  Component Value Date   WBC 2.2 (L) 02/06/2022   NEUTROABS 1.6 (L) 02/06/2022   HGB 11.5 (L) 02/06/2022   HCT 35.2 (L) 02/06/2022   MCV 89.6 02/06/2022   PLT 127 (L) 02/06/2022   Lab Results  Component Value Date   IRON 26 (L) 12/21/2021   TIBC 325 12/21/2021   IRONPCTSAT 8 (L)  12/21/2021   Lab Results  Component Value Date   FERRITIN 15 (L) 12/21/2021     STUDIES: No results found.  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  PET scan results from December 13, 2021 reviewed independently and report as above with no obvious metastatic disease outside of patient's known esophageal mass.  EUS completed on December 14, 2021 confirmed stage.  We discussed possible port placement, but patient declined.  He will benefit from concurrent chemotherapy and XRT using weekly carboplatinum and Taxol.  This will be followed by surgical resection at [facility].  Patient will complete XRT on February 12, 2022.  Proceed with his sixth and final treatment of weekly carboplatinum and Taxol today.  Return to clinic in 6 weeks with repeat imaging using CT scans.  Patient's appointment at [facility] is currently scheduled for Apr 17, 2022 but he is asked to  move this up so his son who is in the [place] can be in attendance.   Iron deficiency anemia: Hemoglobin continues to trend up and is now 11.5.  He last received IV Venofer on January 16, 2022.   Pain: Patient is seen regularly by [facility] pain clinic and receives narcotics from them.  Continue follow-up as scheduled.   Appetite: Patient reports his weight is stable and he is able to tolerate p.o. intake.  Monitor.  Consider referral to dietary if needed. Neutropenia: Resolved.  Patient's ANC remains decreased, but stable at 1.6. Dysphagia: Continue Carafate as per radiation oncology.  Patient expressed understanding and was in agreement with this plan. He also understands that He can call clinic at any time with any questions, concerns, or complaints.    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

## 2022-02-05 ENCOUNTER — Ambulatory Visit
Admission: RE | Admit: 2022-02-05 | Discharge: 2022-02-05 | Disposition: A | Discharge status: Home / Self Care | Payer: Self-pay | Source: Ambulatory Visit | Attending: Radiation Oncology | Admitting: Radiation Oncology

## 2022-02-05 DIAGNOSIS — Z510 Encounter for antineoplastic radiation therapy: Secondary | ICD-10-CM | POA: Diagnosis not present

## 2022-02-06 ENCOUNTER — Other Ambulatory Visit: Payer: Self-pay | Admitting: *Deleted

## 2022-02-06 ENCOUNTER — Inpatient Hospital Stay: Payer: Self-pay

## 2022-02-06 ENCOUNTER — Ambulatory Visit
Admission: RE | Admit: 2022-02-06 | Discharge: 2022-02-06 | Disposition: A | Discharge status: Home / Self Care | Payer: Self-pay | Source: Ambulatory Visit | Attending: Radiation Oncology | Admitting: Radiation Oncology

## 2022-02-06 ENCOUNTER — Other Ambulatory Visit: Payer: Self-pay

## 2022-02-06 ENCOUNTER — Inpatient Hospital Stay (HOSPITAL_BASED_OUTPATIENT_CLINIC_OR_DEPARTMENT_OTHER): Payer: Self-pay | Admitting: Oncology

## 2022-02-06 VITALS — BP 104/67 | HR 66 | Temp 97.6°F | Resp 18 | Wt 145.5 lb

## 2022-02-06 DIAGNOSIS — Z510 Encounter for antineoplastic radiation therapy: Secondary | ICD-10-CM | POA: Diagnosis not present

## 2022-02-06 DIAGNOSIS — C159 Malignant neoplasm of esophagus, unspecified: Secondary | ICD-10-CM

## 2022-02-06 LAB — CBC WITH DIFFERENTIAL/PLATELET
Abs Immature Granulocytes: 0.03 10*3/uL (ref 0.00–0.07)
Basophils Absolute: 0 10*3/uL (ref 0.0–0.1)
Basophils Relative: 1 %
Eosinophils Absolute: 0.1 10*3/uL (ref 0.0–0.5)
Eosinophils Relative: 3 %
HCT: 35.2 % — ABNORMAL LOW (ref 39.0–52.0)
Hemoglobin: 11.5 g/dL — ABNORMAL LOW (ref 13.0–17.0)
Immature Granulocytes: 1 %
Lymphocytes Relative: 11 %
Lymphs Abs: 0.2 10*3/uL — ABNORMAL LOW (ref 0.7–4.0)
MCH: 29.3 pg (ref 26.0–34.0)
MCHC: 32.7 g/dL (ref 30.0–36.0)
MCV: 89.6 fL (ref 80.0–100.0)
Monocytes Absolute: 0.3 10*3/uL (ref 0.1–1.0)
Monocytes Relative: 12 %
Neutro Abs: 1.6 10*3/uL — ABNORMAL LOW (ref 1.7–7.7)
Neutrophils Relative %: 72 %
Platelets: 127 10*3/uL — ABNORMAL LOW (ref 150–400)
RBC: 3.93 MIL/uL — ABNORMAL LOW (ref 4.22–5.81)
RDW: 21.1 % — ABNORMAL HIGH (ref 11.5–15.5)
WBC: 2.2 10*3/uL — ABNORMAL LOW (ref 4.0–10.5)
nRBC: 0 % (ref 0.0–0.2)

## 2022-02-06 LAB — COMPREHENSIVE METABOLIC PANEL
ALT: 17 U/L (ref 0–44)
AST: 15 U/L (ref 15–41)
Albumin: 4 g/dL (ref 3.5–5.0)
Alkaline Phosphatase: 57 U/L (ref 38–126)
Anion gap: 5 (ref 5–15)
BUN: 16 mg/dL (ref 6–20)
CO2: 30 mmol/L (ref 22–32)
Calcium: 9.1 mg/dL (ref 8.9–10.3)
Chloride: 103 mmol/L (ref 98–111)
Creatinine, Ser: 0.69 mg/dL (ref 0.61–1.24)
GFR, Estimated: >60 mL/min (ref >60)
Glucose, Bld: 112 mg/dL — ABNORMAL HIGH (ref 70–99)
Potassium: 4.6 mmol/L (ref 3.5–5.1)
Sodium: 138 mmol/L (ref 135–145)
Total Bilirubin: 0.1 mg/dL — ABNORMAL LOW (ref 0.3–1.2)
Total Protein: 6.9 g/dL (ref 6.5–8.1)

## 2022-02-06 MED ORDER — SODIUM CHLORIDE 0.9 % IV SOLN
242.2000 mg | Freq: Once | INTRAVENOUS | Status: AC
  Administered 2022-02-06: 240 mg via INTRAVENOUS
  Filled 2022-02-06: qty 24

## 2022-02-06 MED ORDER — FAMOTIDINE IN NACL 20-0.9 MG/50ML-% IV SOLN
20.0000 mg | Freq: Once | INTRAVENOUS | Status: AC
  Administered 2022-02-06: 20 mg via INTRAVENOUS
  Filled 2022-02-06: qty 50

## 2022-02-06 MED ORDER — PALONOSETRON HCL INJECTION 0.25 MG/5ML
0.2500 mg | Freq: Once | INTRAVENOUS | Status: AC
  Administered 2022-02-06: 0.25 mg via INTRAVENOUS
  Filled 2022-02-06: qty 5

## 2022-02-06 MED ORDER — SUCRALFATE 1 G PO TABS
1.0000 g | ORAL_TABLET | Freq: Two times a day (BID) | ORAL | 4 refills | Status: DC
  Filled 2022-02-06: qty 60, 30d supply, fill #0

## 2022-02-06 MED ORDER — DIPHENHYDRAMINE HCL 50 MG/ML IJ SOLN
25.0000 mg | Freq: Once | INTRAMUSCULAR | Status: AC
  Administered 2022-02-06: 25 mg via INTRAVENOUS
  Filled 2022-02-06: qty 1

## 2022-02-06 MED ORDER — SODIUM CHLORIDE 0.9 % IV SOLN
Freq: Once | INTRAVENOUS | Status: AC
  Filled 2022-02-06: qty 250

## 2022-02-06 MED ORDER — HEPARIN SOD (PORK) LOCK FLUSH 100 UNIT/ML IV SOLN
500.0000 [IU] | Freq: Once | INTRAVENOUS | Status: DC | PRN
  Filled 2022-02-06: qty 5

## 2022-02-06 MED ORDER — SODIUM CHLORIDE 0.9 % IV SOLN
50.0000 mg/m2 | Freq: Once | INTRAVENOUS | Status: AC
  Administered 2022-02-06: 90 mg via INTRAVENOUS
  Filled 2022-02-06: qty 15

## 2022-02-06 MED ORDER — SODIUM CHLORIDE 0.9 % IV SOLN
10.0000 mg | Freq: Once | INTRAVENOUS | Status: AC
  Administered 2022-02-06: 10 mg via INTRAVENOUS
  Filled 2022-02-06: qty 10

## 2022-02-06 NOTE — Patient Instructions (Signed)
[facility] CANCER CTR AT [place]  Discharge Instructions: Thank you for choosing [facility] to provide your oncology and hematology care.  If you have a lab appointment with the [facility], please go directly to the [facility] and check in at the registration area.  Wear comfortable clothing and clothing appropriate for easy access to any Portacath or PICC line.   We strive to give you quality time with your provider. You may need to reschedule your appointment if you arrive late (15 or more minutes).  Arriving late affects you and other patients whose appointments are after yours.  Also, if you miss three or more appointments without notifying the office, you may be dismissed from the clinic at the providers discretion.      For prescription refill requests, have your pharmacy contact our office and allow 72 hours for refills to be completed.    Today you received the following chemotherapy and/or immunotherapy agents Taxol and Carboplatin       To help prevent nausea and vomiting after your treatment, we encourage you to take your nausea medication as directed.  BELOW ARE SYMPTOMS THAT SHOULD BE REPORTED IMMEDIATELY: *FEVER GREATER THAN 100.4 F (38 C) OR HIGHER *CHILLS OR SWEATING *NAUSEA AND VOMITING THAT IS NOT CONTROLLED WITH YOUR NAUSEA MEDICATION *UNUSUAL SHORTNESS OF BREATH *UNUSUAL BRUISING OR BLEEDING *URINARY PROBLEMS (pain or burning when urinating, or frequent urination) *BOWEL PROBLEMS (unusual diarrhea, constipation, pain near the anus) TENDERNESS IN MOUTH AND THROAT WITH OR WITHOUT PRESENCE OF ULCERS (sore throat, sores in mouth, or a toothache) UNUSUAL RASH, SWELLING OR PAIN  UNUSUAL VAGINAL DISCHARGE OR ITCHING   Items with * indicate a potential emergency and should be followed up as soon as possible or go to the Emergency Department if any problems should occur.  Please show the CHEMOTHERAPY ALERT CARD or IMMUNOTHERAPY ALERT CARD at  check-in to the Emergency Department and triage nurse.  Should you have questions after your visit or need to cancel or reschedule your appointment, please contact [facility] CANCER [place] AT [place]  [phone] and follow the prompts.  Office hours are 8:00 a.m. to 4:30 p.m. Monday - Friday. Please note that voicemails left after 4:00 p.m. may not be returned until the following business day.  We are closed weekends and major holidays. You have access to a nurse at all times for urgent questions. Please call the main number to the clinic [phone] and follow the prompts.  For any non-urgent questions, you may also contact your provider using MyChart. We now offer e-Visits for anyone 22 and older to request care online for non-urgent symptoms. For details visit [email or web address].   Also download the MyChart app! Go to the app store, search "MyChart", open the app, select [facility], and log in with your MyChart username and password.  Due to Covid, a mask is required upon entering the hospital/clinic. If you do not have a mask, one will be given to you upon arrival. For doctor visits, patients may have 1 support person aged 19 or older with them. For treatment visits, patients cannot have anyone with them due to current Covid guidelines and our immunocompromised population.

## 2022-02-06 NOTE — Progress Notes (Signed)
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  Patient receiving concurrent radiation and chemotherapy.   Met with patient during infusion today.  Patient reports that he is still eating about the same.  Noted carafate being prescribed today by Dr Debroah Loop.  Drinking glucerna shakes times 2 per day.     Medications: reviewed  Labs: reviewed  Anthropometrics:   Weight 145 lb 8 oz today  144 lb 6.4 oz on 2/21 145 lb 4.8 oz on 2/14 148 lb 1.6 oz on 2/7 147 lb on 1/31 143 lb on 1/24 147 lb on 1/12 146 lb on 12/5   NUTRITION DIAGNOSIS: Predicted sub optimal energy intake continues with treatment   INTERVENTION:  Continue glucerna shakes BID, coupons given Encouraged stable weight    MONITORING, EVALUATION, GOAL: weight trends, intake   NEXT VISIT: Tuesday, March 28 phone call  Samuel Pennington, [place], [place] Registered Dietitian [phone] (mobile)

## 2022-02-06 NOTE — Progress Notes (Signed)
Pt requesting surgery to be scheduled between April 1-25 if needed. States that his son will be caring for him and providing transportation during that time. Son is in the [place] and that is the only time that his son can take off. Pt also requesting something for constipation. Has tried prune juice with no relief.

## 2022-02-06 NOTE — Progress Notes (Signed)
carafate

## 2022-02-07 ENCOUNTER — Other Ambulatory Visit: Payer: Self-pay | Admitting: *Deleted

## 2022-02-07 ENCOUNTER — Ambulatory Visit
Admission: RE | Admit: 2022-02-07 | Discharge: 2022-02-07 | Disposition: A | Discharge status: Home / Self Care | Payer: Self-pay | Source: Ambulatory Visit | Attending: Radiation Oncology | Admitting: Radiation Oncology

## 2022-02-07 DIAGNOSIS — Z510 Encounter for antineoplastic radiation therapy: Secondary | ICD-10-CM | POA: Diagnosis not present

## 2022-02-07 DIAGNOSIS — C159 Malignant neoplasm of esophagus, unspecified: Secondary | ICD-10-CM | POA: Diagnosis present

## 2022-02-07 MED ORDER — SUCRALFATE 1 G PO TABS: 1.0000 g | ORAL_TABLET | Freq: Two times a day (BID) | ORAL | 4 refills | Status: DC

## 2022-02-08 ENCOUNTER — Ambulatory Visit
Admission: RE | Admit: 2022-02-08 | Discharge: 2022-02-08 | Disposition: A | Discharge status: Home / Self Care | Payer: Self-pay | Source: Ambulatory Visit | Attending: Radiation Oncology | Admitting: Radiation Oncology

## 2022-02-08 DIAGNOSIS — Z510 Encounter for antineoplastic radiation therapy: Secondary | ICD-10-CM | POA: Diagnosis not present

## 2022-02-09 ENCOUNTER — Other Ambulatory Visit: Payer: Self-pay

## 2022-02-09 ENCOUNTER — Encounter: Payer: Self-pay | Admitting: Oncology

## 2022-02-09 ENCOUNTER — Emergency Department
Admission: EM | Admit: 2022-02-09 | Discharge: 2022-02-09 | Disposition: A | Discharge status: Home / Self Care | Payer: No Typology Code available for payment source | Attending: Emergency Medicine | Admitting: Emergency Medicine

## 2022-02-09 ENCOUNTER — Ambulatory Visit
Admission: RE | Admit: 2022-02-09 | Discharge: 2022-02-09 | Disposition: A | Discharge status: Home / Self Care | Payer: Self-pay | Source: Ambulatory Visit | Attending: Radiation Oncology | Admitting: Radiation Oncology

## 2022-02-09 ENCOUNTER — Emergency Department: Payer: No Typology Code available for payment source

## 2022-02-09 ENCOUNTER — Encounter: Payer: Self-pay | Admitting: Emergency Medicine

## 2022-02-09 DIAGNOSIS — S199XXA Unspecified injury of neck, initial encounter: Secondary | ICD-10-CM | POA: Diagnosis present

## 2022-02-09 DIAGNOSIS — Y92410 Unspecified street and highway as the place of occurrence of the external cause: Secondary | ICD-10-CM | POA: Diagnosis not present

## 2022-02-09 DIAGNOSIS — S39012A Strain of muscle, fascia and tendon of lower back, initial encounter: Secondary | ICD-10-CM | POA: Diagnosis not present

## 2022-02-09 DIAGNOSIS — Z510 Encounter for antineoplastic radiation therapy: Secondary | ICD-10-CM | POA: Diagnosis not present

## 2022-02-09 DIAGNOSIS — S161XXA Strain of muscle, fascia and tendon at neck level, initial encounter: Secondary | ICD-10-CM | POA: Insufficient documentation

## 2022-02-09 MED ORDER — CYCLOBENZAPRINE HCL 10 MG PO TABS: 10.0000 mg | ORAL_TABLET | Freq: Three times a day (TID) | ORAL | 0 refills | Status: DC | PRN

## 2022-02-09 NOTE — Discharge Instructions (Signed)
Follow-up with your regular doctor if not improved in 5 to 7 days.  You may also follow-up with orthopedics.  Use medication as per ?
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
## 2022-02-12 ENCOUNTER — Ambulatory Visit
Admission: RE | Admit: 2022-02-12 | Discharge: 2022-02-12 | Disposition: A | Discharge status: Home / Self Care | Payer: Self-pay | Source: Ambulatory Visit | Attending: Radiation Oncology | Admitting: Radiation Oncology

## 2022-02-12 DIAGNOSIS — Z510 Encounter for antineoplastic radiation therapy: Secondary | ICD-10-CM | POA: Diagnosis not present

## 2022-02-19 ENCOUNTER — Other Ambulatory Visit: Payer: Self-pay

## 2022-02-19 ENCOUNTER — Other Ambulatory Visit: Payer: Self-pay | Admitting: *Deleted

## 2022-02-19 DIAGNOSIS — C159 Malignant neoplasm of esophagus, unspecified: Secondary | ICD-10-CM

## 2022-02-19 MED ORDER — ONDANSETRON HCL 4 MG PO TABS: 8.0000 mg | ORAL_TABLET | Freq: Two times a day (BID) | ORAL | 1 refills | Status: DC | PRN

## 2022-02-19 MED ORDER — SUCRALFATE 1 G PO TABS: 1.0000 g | ORAL_TABLET | Freq: Three times a day (TID) | ORAL | 2 refills | Status: DC

## 2022-02-21 ENCOUNTER — Other Ambulatory Visit: Payer: Self-pay

## 2022-02-26 ENCOUNTER — Inpatient Hospital Stay: Payer: Self-pay | Attending: Internal Medicine | Admitting: Medical Oncology

## 2022-02-26 ENCOUNTER — Ambulatory Visit
Admission: RE | Admit: 2022-02-26 | Discharge: 2022-02-26 | Disposition: A | Discharge status: Home / Self Care | Payer: Self-pay | Source: Ambulatory Visit | Attending: Medical Oncology | Admitting: Medical Oncology

## 2022-02-26 ENCOUNTER — Inpatient Hospital Stay: Payer: Self-pay

## 2022-02-26 ENCOUNTER — Other Ambulatory Visit: Payer: Self-pay

## 2022-02-26 ENCOUNTER — Encounter: Payer: Self-pay | Admitting: Medical Oncology

## 2022-02-26 ENCOUNTER — Ambulatory Visit
Admission: RE | Admit: 2022-02-26 | Discharge: 2022-02-26 | Disposition: A | Discharge status: Home / Self Care | Payer: Self-pay | Attending: Medical Oncology | Admitting: Medical Oncology

## 2022-02-26 VITALS — BP 118/64 | HR 89 | Temp 98.9°F | Resp 18 | Wt 145.8 lb

## 2022-02-26 DIAGNOSIS — R1084 Generalized abdominal pain: Secondary | ICD-10-CM

## 2022-02-26 DIAGNOSIS — Z8501 Personal history of malignant neoplasm of esophagus: Secondary | ICD-10-CM | POA: Diagnosis not present

## 2022-02-26 DIAGNOSIS — K5900 Constipation, unspecified: Secondary | ICD-10-CM | POA: Diagnosis not present

## 2022-02-26 DIAGNOSIS — C159 Malignant neoplasm of esophagus, unspecified: Secondary | ICD-10-CM | POA: Diagnosis present

## 2022-02-26 DIAGNOSIS — E119 Type 2 diabetes mellitus without complications: Secondary | ICD-10-CM | POA: Diagnosis not present

## 2022-02-26 LAB — CBC WITH DIFFERENTIAL/PLATELET
Abs Immature Granulocytes: 0.02 10*3/uL (ref 0.00–0.07)
Basophils Absolute: 0 10*3/uL (ref 0.0–0.1)
Basophils Relative: 0 %
Eosinophils Absolute: 0 10*3/uL (ref 0.0–0.5)
Eosinophils Relative: 1 %
HCT: 34.5 % — ABNORMAL LOW (ref 39.0–52.0)
Hemoglobin: 11.3 g/dL — ABNORMAL LOW (ref 13.0–17.0)
Immature Granulocytes: 0 %
Lymphocytes Relative: 6 %
Lymphs Abs: 0.3 10*3/uL — ABNORMAL LOW (ref 0.7–4.0)
MCH: 29.4 pg (ref 26.0–34.0)
MCHC: 32.8 g/dL (ref 30.0–36.0)
MCV: 89.8 fL (ref 80.0–100.0)
Monocytes Absolute: 0.7 10*3/uL (ref 0.1–1.0)
Monocytes Relative: 12 %
Neutro Abs: 4.9 10*3/uL (ref 1.7–7.7)
Neutrophils Relative %: 81 %
Platelets: 189 10*3/uL (ref 150–400)
RBC: 3.84 MIL/uL — ABNORMAL LOW (ref 4.22–5.81)
RDW: 23.2 % — ABNORMAL HIGH (ref 11.5–15.5)
WBC: 6 10*3/uL (ref 4.0–10.5)
nRBC: 0 % (ref 0.0–0.2)

## 2022-02-26 LAB — COMPREHENSIVE METABOLIC PANEL
ALT: 14 U/L (ref 0–44)
AST: 11 U/L — ABNORMAL LOW (ref 15–41)
Albumin: 3.6 g/dL (ref 3.5–5.0)
Alkaline Phosphatase: 51 U/L (ref 38–126)
Anion gap: 7 (ref 5–15)
BUN: 13 mg/dL (ref 6–20)
CO2: 27 mmol/L (ref 22–32)
Calcium: 8.8 mg/dL — ABNORMAL LOW (ref 8.9–10.3)
Chloride: 98 mmol/L (ref 98–111)
Creatinine, Ser: 0.71 mg/dL (ref 0.61–1.24)
GFR, Estimated: >60 mL/min (ref >60)
Glucose, Bld: 209 mg/dL — ABNORMAL HIGH (ref 70–99)
Potassium: 4 mmol/L (ref 3.5–5.1)
Sodium: 132 mmol/L — ABNORMAL LOW (ref 135–145)
Total Bilirubin: 0.2 mg/dL — ABNORMAL LOW (ref 0.3–1.2)
Total Protein: 6.8 g/dL (ref 6.5–8.1)

## 2022-02-26 LAB — LIPASE, BLOOD: Lipase: 31 U/L (ref 11–51)

## 2022-02-26 MED ORDER — ONDANSETRON HCL 8 MG PO TABS: 8.0000 mg | ORAL_TABLET | Freq: Three times a day (TID) | ORAL | 0 refills | Status: DC | PRN

## 2022-02-26 MED ORDER — SUCRALFATE 1 G PO TABS: 1.0000 g | ORAL_TABLET | Freq: Three times a day (TID) | ORAL | 0 refills | Status: AC
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
## 2022-02-26 NOTE — Progress Notes (Signed)
Pt states since finishing radiation and chemotherapy he feels worse. Fatigued, nauseated. Having  mid back pain. Abdominal pain is rated 9/10. Pt reports being very constipated. Instructed him to get Miralax and start taking 2-3 caps to get his bowels moving. VSS. ?

## 2022-03-05 ENCOUNTER — Other Ambulatory Visit: Payer: Self-pay | Admitting: Emergency Medicine

## 2022-03-05 MED ORDER — PROMETHAZINE HCL 25 MG PO TABS: 25.0000 mg | ORAL_TABLET | Freq: Four times a day (QID) | ORAL | 2 refills | Status: DC | PRN

## 2022-03-06 ENCOUNTER — Inpatient Hospital Stay: Payer: Self-pay

## 2022-03-06 NOTE — Progress Notes (Signed)
Nutrition ? ?Patient scheduled for phone visit today.  Unable to reach patient as message said call unable to be completed at this time.  Will add RD follow-up to Wednesday, April 15th following MD visit. ? ?Topacio Cella B. Zenia Resides, RD, LDN ?Registered Dietitian ?[phone] ? ?

## 2022-03-07 ENCOUNTER — Other Ambulatory Visit: Payer: Self-pay

## 2022-03-12 ENCOUNTER — Other Ambulatory Visit: Payer: Self-pay | Admitting: Emergency Medicine

## 2022-03-12 DIAGNOSIS — R1084 Generalized abdominal pain: Secondary | ICD-10-CM

## 2022-03-12 MED ORDER — ONDANSETRON HCL 8 MG PO TABS: 8.0000 mg | ORAL_TABLET | Freq: Three times a day (TID) | ORAL | 0 refills | Status: DC | PRN

## 2022-03-14 ENCOUNTER — Other Ambulatory Visit: Payer: Self-pay | Admitting: Emergency Medicine

## 2022-03-14 DIAGNOSIS — R1084 Generalized abdominal pain: Secondary | ICD-10-CM

## 2022-03-14 MED ORDER — ONDANSETRON HCL 8 MG PO TABS: 8.0000 mg | ORAL_TABLET | Freq: Four times a day (QID) | ORAL | 0 refills | Status: DC | PRN

## 2022-03-15 ENCOUNTER — Inpatient Hospital Stay: Payer: Self-pay

## 2022-03-15 ENCOUNTER — Other Ambulatory Visit: Payer: Self-pay

## 2022-03-15 ENCOUNTER — Inpatient Hospital Stay: Payer: Self-pay | Attending: Hospice and Palliative Medicine | Admitting: Hospice and Palliative Medicine

## 2022-03-15 VITALS — BP 118/69 | HR 84 | Temp 98.9°F | Resp 16 | Wt 142.0 lb

## 2022-03-15 DIAGNOSIS — K219 Gastro-esophageal reflux disease without esophagitis: Secondary | ICD-10-CM | POA: Diagnosis not present

## 2022-03-15 DIAGNOSIS — E119 Type 2 diabetes mellitus without complications: Secondary | ICD-10-CM | POA: Insufficient documentation

## 2022-03-15 DIAGNOSIS — C159 Malignant neoplasm of esophagus, unspecified: Secondary | ICD-10-CM

## 2022-03-15 DIAGNOSIS — R110 Nausea: Secondary | ICD-10-CM | POA: Diagnosis not present

## 2022-03-15 LAB — CBC WITH DIFFERENTIAL/PLATELET
Abs Immature Granulocytes: 0.1 10*3/uL — ABNORMAL HIGH (ref 0.00–0.07)
Basophils Absolute: 0 10*3/uL (ref 0.0–0.1)
Basophils Relative: 1 %
Eosinophils Absolute: 0.1 10*3/uL (ref 0.0–0.5)
Eosinophils Relative: 1 %
HCT: 33 % — ABNORMAL LOW (ref 39.0–52.0)
Hemoglobin: 10.7 g/dL — ABNORMAL LOW (ref 13.0–17.0)
Immature Granulocytes: 2 %
Lymphocytes Relative: 7 %
Lymphs Abs: 0.4 10*3/uL — ABNORMAL LOW (ref 0.7–4.0)
MCH: 29.2 pg (ref 26.0–34.0)
MCHC: 32.4 g/dL (ref 30.0–36.0)
MCV: 89.9 fL (ref 80.0–100.0)
Monocytes Absolute: 0.3 10*3/uL (ref 0.1–1.0)
Monocytes Relative: 6 %
Neutro Abs: 5.2 10*3/uL (ref 1.7–7.7)
Neutrophils Relative %: 83 %
Platelets: 295 10*3/uL (ref 150–400)
RBC: 3.67 MIL/uL — ABNORMAL LOW (ref 4.22–5.81)
RDW: 21.7 % — ABNORMAL HIGH (ref 11.5–15.5)
WBC: 6.2 10*3/uL (ref 4.0–10.5)
nRBC: 0 % (ref 0.0–0.2)

## 2022-03-15 LAB — COMPREHENSIVE METABOLIC PANEL
ALT: 17 U/L (ref 0–44)
AST: 16 U/L (ref 15–41)
Albumin: 3.6 g/dL (ref 3.5–5.0)
Alkaline Phosphatase: 67 U/L (ref 38–126)
Anion gap: 8 (ref 5–15)
BUN: 15 mg/dL (ref 6–20)
CO2: 28 mmol/L (ref 22–32)
Calcium: 8.8 mg/dL — ABNORMAL LOW (ref 8.9–10.3)
Chloride: 97 mmol/L — ABNORMAL LOW (ref 98–111)
Creatinine, Ser: 0.89 mg/dL (ref 0.61–1.24)
GFR, Estimated: >60 mL/min (ref >60)
Glucose, Bld: 316 mg/dL — ABNORMAL HIGH (ref 70–99)
Potassium: 4.6 mmol/L (ref 3.5–5.1)
Sodium: 133 mmol/L — ABNORMAL LOW (ref 135–145)
Total Bilirubin: 0.2 mg/dL — ABNORMAL LOW (ref 0.3–1.2)
Total Protein: 7.1 g/dL (ref 6.5–8.1)

## 2022-03-15 MED ORDER — PANTOPRAZOLE SODIUM 40 MG PO TBEC: 40.0000 mg | DELAYED_RELEASE_TABLET | Freq: Every day | ORAL | 2 refills | Status: DC

## 2022-03-15 MED ORDER — OLANZAPINE 5 MG PO TABS: 5.0000 mg | ORAL_TABLET | Freq: Every evening | ORAL | 0 refills | Status: DC | PRN

## 2022-03-15 NOTE — Progress Notes (Signed)
? ?Symptom Management Clinic ?[place] at [facility] ?Telephone:([phone]) [phone] Fax:(336) [phone] ? ?Patient Care Team: ?Revelo, Elyse Jarvis, MD as PCP - General (Family Medicine) ?Lloyd Huger, MD as Consulting Physician (Oncology) ?Clent Jacks, RN as Oncology Nurse Navigator  ? ?Name of the patient: Samuel Pennington  ?[id number]  ?01/01/1966  ? ?Date of visit: 03/15/22 ? ?Reason for Consult: ?Samuel Pennington is a 56 y.o. 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  ? ?Patient presents to [facility] today for evaluation of nausea. He has had chronic nausea since XRT/chemotherapy. He has been tried on prochlorperazine, promethazine, and is currently taking ondansetron.  Dose of ondansetron was recently increased from 4 mg to 8 mg 3 times daily.  Patient reports that this is helped but he still has his burning sensation in the stomach through the esophagus.  Patient says that since the dose of Zofran was increased, he has been having night sweats.  He denies fever or chills.  No vomiting, abdominal distention or pain. ? ?Denies any neurologic complaints. Denies recent fevers or illnesses. Denies any easy bleeding or bruising. Reports good appetite and denies weight loss. Denies chest pain. Denies any vomiting, constipation, or diarrhea. Denies urinary complaints. Patient offers no further specific complaints today. ? ?PAST MEDICAL HISTORY: ?Past Medical History:  ?Diagnosis Date  ? Cancer [facility])   ? Diabetes mellitus without complication ([place])   ? Hyperlipidemia   ? Neuromuscular disorder ([place])   ? ? ?PAST SURGICAL HISTORY:  ?Past Surgical History:  ?Procedure Laterality Date  ? COLONOSCOPY WITH PROPOFOL N/A 11/14/2021  ? Procedure: COLONOSCOPY WITH PROPOFOL;  Surgeon: Lesly Rubenstein, MD;  Location: [facility] ENDOSCOPY;  Service:  Endoscopy;  Laterality: N/A;  ? ESOPHAGOGASTRODUODENOSCOPY (EGD) WITH PROPOFOL N/A 11/14/2021  ? Procedure: ESOPHAGOGASTRODUODENOSCOPY (EGD) WITH PROPOFOL;  Surgeon: Lesly Rubenstein, MD;  Location: [facility] ENDOSCOPY;  Service: Endoscopy;  Laterality: N/A;  ? EUS N/A 12/14/2021  ? Procedure: FULL UPPER ENDOSCOPIC ULTRASOUND (EUS) RADIAL;  Surgeon: Holly Bodily, MD;  Location: [facility] ENDOSCOPY;  Service: Gastroenterology;  Laterality: N/A;  SPANISH INTERPRETER  ? ? ?HEMATOLOGY/ONCOLOGY HISTORY:  ?Oncology History  ?Esophageal adenocarcinoma ([place])  ?11/15/2021 Initial Diagnosis  ? Esophageal adenocarcinoma ([place]) ?  ?12/22/2021 Cancer Staging  ? Staging form: Esophagus - Adenocarcinoma, AJCC 8th Edition ?- Clinical stage from 12/22/2021: Stage III (cT3, cN0, cM0) - Signed by Lloyd Huger, MD on 12/22/2021 ?Stage prefix: Initial diagnosis ?Total positive nodes: 0 ? ?  ?01/02/2022 -  Chemotherapy  ? Patient is on Treatment Plan : ESOPHAGUS Carboplatin/PACLitaxel weekly x 6 weeks with XRT    ?   ? ? ?ALLERGIES:  has No Known Allergies. ? ?MEDICATIONS:  ?Current Outpatient Medications  ?Medication Sig Dispense Refill  ? atorvastatin (LIPITOR) 40 MG tablet TOMA UNA TABLETA POR BOCA AL ACOSTARSE PARA COLESTEROL ALTO 90 tablet 3  ? cyclobenzaprine (FLEXERIL) 10 MG tablet Take 1 tablet (10 mg total) by mouth 3 (three) times daily as needed. 30 tablet 0  ? glipiZIDE (GLUCOTROL XL) 5 MG 24 hr tablet TOME 1 TABLETA POR BOCA DIARIO PARA LA DIABETES 90 tablet 1  ? lidocaine-prilocaine (EMLA) cream Apply to affected area once 30 g 3  ? metFORMIN (GLUCOPHAGE) 1000 MG tablet Take 1,000 mg by mouth daily.    ? ondansetron (ZOFRAN) 8 MG tablet Take 1 tablet (8 mg  total) by mouth every 6 (six) hours as needed for nausea or vomiting. 20 tablet 0  ? oxyCODONE (OXY IR/ROXICODONE) 5 MG immediate release tablet Take 10 mg by mouth every 6 (six) hours as needed for pain.    ? pantoprazole (PROTONIX) 40 MG tablet Take 1 tablet (40 mg  total) by mouth daily. 30 tablet 0  ? prochlorperazine (COMPAZINE) 10 MG tablet Take 1 tablet (10 mg total) by mouth every 6 (six) hours as needed (Nausea or vomiting). (Patient not taking: Reported on 02/26/2022) 60 tablet 2  ? promethazine (PHENERGAN) 25 MG tablet Take 1 tablet (25 mg total) by mouth every 6 (six) hours as needed for nausea or vomiting. 30 tablet 2  ? sucralfate (CARAFATE) 1 g tablet Take 1 tablet (1 g total) by mouth 4 (four) times daily -  with meals and at bedtime. 60 tablet 0  ? ?No current facility-administered medications for this visit.  ? ? ?VITAL SIGNS: ?BP 118/69   Pulse 84   Temp 98.9 ?F (37.2 ?C) (Tympanic)   Resp 16   Wt 142 lb (64.4 kg)   SpO2 100%   BMI 22.92 kg/m?  ?Filed Weights  ? 03/15/22 1024  ?Weight: 142 lb (64.4 kg)  ?  ?Estimated body mass index is 22.92 kg/m? as calculated from the following: ?  Height as of 02/09/22: '5\' 6"'$  (1.676 m). ?  Weight as of this encounter: 142 lb (64.4 kg). ? ?LABS: ?CBC: ?   ?Component Value Date/Time  ? WBC 6.2 03/15/2022 1039  ? HGB 10.7 (L) 03/15/2022 1039  ? HCT 33.0 (L) 03/15/2022 1039  ? PLT 295 03/15/2022 1039  ? MCV 89.9 03/15/2022 1039  ? NEUTROABS 5.2 03/15/2022 1039  ? LYMPHSABS 0.4 (L) 03/15/2022 1039  ? MONOABS 0.3 03/15/2022 1039  ? EOSABS 0.1 03/15/2022 1039  ? BASOSABS 0.0 03/15/2022 1039  ? ?Comprehensive Metabolic Panel: ?   ?Component Value Date/Time  ? NA 133 (L) 03/15/2022 1039  ? K 4.6 03/15/2022 1039  ? CL 97 (L) 03/15/2022 1039  ? CO2 28 03/15/2022 1039  ? BUN 15 03/15/2022 1039  ? CREATININE 0.89 03/15/2022 1039  ? GLUCOSE 316 (H) 03/15/2022 1039  ? CALCIUM 8.8 (L) 03/15/2022 1039  ? AST 16 03/15/2022 1039  ? ALT 17 03/15/2022 1039  ? ALKPHOS 67 03/15/2022 1039  ? BILITOT 0.2 (L) 03/15/2022 1039  ? PROT 7.1 03/15/2022 1039  ? ALBUMIN 3.6 03/15/2022 1039  ? ? ?RADIOGRAPHIC STUDIES: ?DG Abd 1 View ? ?Result Date: 03/01/2022 ?CLINICAL DATA:  Generalized abdominal pain.  Constipation. EXAM: ABDOMEN - 1 VIEW COMPARISON:   None. FINDINGS: Moderate fecal loading is identified throughout the colon, particularly in the region of the cecum and rectum. No bowel obstruction. No other abnormalities. IMPRESSION: Moderate fecal loading as above. Electronically Signed   By: Dorise Bullion III M.D.   On: 03/01/2022 13:06   ? ?PERFORMANCE STATUS (ECOG) : 1 - Symptomatic but completely ambulatory ? ?Review of Systems ?Unless otherwise noted, a complete review of systems is negative. ? ?Physical Exam ?General: NAD ?Cardiovascular: regular rate and rhythm ?Pulmonary: clear ant fields ?Abdomen: soft, nontender, + bowel sounds ?GU: no suprapubic tenderness ?Extremities: no edema, no joint deformities ?Skin: no rashes ?Neurological:  nonfocal ? ?Assessment and Plan- Patient is a 56 y.o. 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  ? ?Nausea -  I suspect that patient is having a component of GERD and post treatment effects from XRT. Restart Protonix daily. Will start low dose olanzapine '5mg'$  QHS PRN for nausea. Lessen use of ondansetron. QTC on most recent EKG WNL. Case and plan discussed with Dr. Grayland Ormond.  ?  ?Patient is pending repeat CT chest, abdomen, and pelvis on 03/19/2022. Patient has upcoming appointment with [facility] next week.  ? ?Return as scheduled to see Dr. Grayland Ormond on 03/21/2022. ? ? ?Patient expressed understanding and was in agreement with this plan. He also understands that He can call clinic at any time with any questions, concerns, or complaints.  ? ?Thank you for allowing me to participate in the care of this very pleasant patient.  ? ?Time Total: 15 minutes ? ?Visit consisted of counseling and education dealing with the complex and emotionally intense issues of symptom management in the setting of serious illness.Greater than 50%  of this time was spent counseling and coordinating care  related to the above assessment and plan. ? ?Signed by: ?Altha Harm, PhD, NP-C ? ? ? ? ?

## 2022-03-16 ENCOUNTER — Ambulatory Visit: Payer: No Typology Code available for payment source

## 2022-03-19 ENCOUNTER — Ambulatory Visit
Admission: RE | Admit: 2022-03-19 | Discharge: 2022-03-19 | Disposition: A | Discharge status: Home / Self Care | Payer: Self-pay | Source: Ambulatory Visit | Attending: Oncology | Admitting: Oncology

## 2022-03-19 DIAGNOSIS — C159 Malignant neoplasm of esophagus, unspecified: Secondary | ICD-10-CM | POA: Insufficient documentation

## 2022-03-19 MED ORDER — IOHEXOL 300 MG/ML  SOLN
100.0000 mL | Freq: Once | INTRAMUSCULAR | Status: AC | PRN
  Administered 2022-03-19: 100 mL via INTRAVENOUS

## 2022-03-19 NOTE — Progress Notes (Signed)
?[place]  ?[phone] [phone] ? ?ID: Samuel Pennington OB: 06/26/1966  MR#: [id number]  QVZ#:[id number] ? ?Patient Care Team: ?Revelo, Elyse Jarvis, MD as PCP - General (Family Medicine) ?Lloyd Huger, MD as Consulting Physician (Oncology) ?Clent Jacks, RN as Oncology Nurse Navigator ? 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 ? ?INTERVAL HISTORY: Patient returns to clinic today for further evaluation and discussion of his imaging results.  He recently was having significant nausea, but on further investigation his symptoms are more consistent of acid reflux.  He reports he is "90%" improved after initiating olanzapine and Protonix.  He has insomnia, but otherwise feels well.  He does not complain of pain today.  He has no further GI bleeds. He has no neurologic complaints.  He denies any recent fevers.  He has a fair appetite, but denies weight loss.  He has no chest pain, shortness of breath, cough, or hemoptysis.  He denies any nausea, vomiting, constipation, or diarrhea.  He has no further melena or hematochezia.  He has no urinary complaints.  Patient offers no further specific complaints today. ? ?REVIEW OF SYSTEMS:   ?Review of Systems  ?Constitutional: Negative.  Negative for fever, malaise/fatigue and weight loss.  ?Respiratory: Negative.  Negative for cough, hemoptysis and shortness of breath.   ?Cardiovascular: Negative.  Negative for chest pain and leg swelling.  ?Gastrointestinal:  Positive for heartburn. Negative for abdominal pain, blood in stool and melena.  ?Genitourinary: Negative.  Negative for dysuria.  ?Musculoskeletal: Negative.  Negative for back pain.  ?Skin: Negative.  Negative for rash.  ?Neurological: Negative.  Negative for dizziness, focal weakness, weakness and headaches.  ?Psychiatric/Behavioral:  The patient has insomnia. The patient is not nervous/anxious.   ? ? ?As per HPI.  Otherwise, a complete review of systems is negative. ? ?PAST MEDICAL HISTORY: ?Past Medical History:  ?Diagnosis Date  ? Cancer [facility])   ? Diabetes mellitus without complication ([place])   ? Hyperlipidemia   ? Neuromuscular disorder ([place])   ? ? ?PAST SURGICAL HISTORY: ?Past Surgical History:  ?Procedure Laterality Date  ? COLONOSCOPY WITH PROPOFOL N/A 11/14/2021  ? Procedure: COLONOSCOPY WITH PROPOFOL;  Surgeon: Lesly Rubenstein, MD;  Location: [facility] ENDOSCOPY;  Service: Endoscopy;  Laterality: N/A;  ? ESOPHAGOGASTRODUODENOSCOPY (EGD) WITH PROPOFOL N/A 11/14/2021  ? Procedure: ESOPHAGOGASTRODUODENOSCOPY (EGD) WITH PROPOFOL;  Surgeon: Lesly Rubenstein, MD;  Location: [facility] ENDOSCOPY;  Service: Endoscopy;  Laterality: N/A;  ? EUS N/A 12/14/2021  ? Procedure: FULL UPPER ENDOSCOPIC ULTRASOUND (EUS) RADIAL;  Surgeon: Holly Bodily, MD;  Location: [facility] ENDOSCOPY;  Service: Gastroenterology;  Laterality: N/A;  SPANISH INTERPRETER  ? ? ?FAMILY HISTORY: ?Family History  ?Problem Relation Age of Onset  ? Hypertension Mother   ? ? ?ADVANCED DIRECTIVES (Y/N):  N ? ?HEALTH MAINTENANCE: ?Social History  ? ?Tobacco Use  ? Smoking status: Never  ? Smokeless tobacco: Never  ?Vaping Use  ? Vaping Use: Never used  ?Substance Use Topics  ? Alcohol use: Yes  ?  Comment: occassionallh  ? Drug use: Never  ? ? ? Colonoscopy: ? PAP: ? Bone density: ? Lipid panel: ? ?No Known Allergies ? ?Current Outpatient Medications  ?Medication Sig Dispense Refill  ? atorvastatin (LIPITOR) 40 MG tablet TOMA UNA TABLETA POR BOCA AL ACOSTARSE PARA COLESTEROL ALTO 90 tablet 3  ? glipiZIDE (GLUCOTROL XL) 5 MG 24 hr tablet TOME 1 TABLETA POR BOCA DIARIO PARA LA DIABETES 90 tablet 1  ?  metFORMIN (GLUCOPHAGE) 1000 MG tablet Take 1,000 mg by mouth daily.    ? OLANZapine (ZYPREXA) 5 MG tablet Take 1 tablet (5 mg total) by mouth at bedtime as needed (nausea). 30 tablet 0  ? oxyCODONE (OXY IR/ROXICODONE) 5 MG immediate release tablet Take 10 mg by mouth every 6  (six) hours as needed for pain.    ? pantoprazole (PROTONIX) 40 MG tablet Take 1 tablet (40 mg total) by mouth daily. 30 tablet 2  ? sucralfate (CARAFATE) 1 g tablet Take 1 tablet (1 g total) by mouth 4 (four) times daily -  with meals and at bedtime. 60 tablet 0  ? cyclobenzaprine (FLEXERIL) 10 MG tablet Take 1 tablet (10 mg total) by mouth 3 (three) times daily as needed. (Patient not taking: Reported on 03/21/2022) 30 tablet 0  ? lidocaine-prilocaine (EMLA) cream Apply to affected area once (Patient not taking: Reported on 03/21/2022) 30 g 3  ? ondansetron (ZOFRAN) 8 MG tablet Take 1 tablet (8 mg total) by mouth every 6 (six) hours as needed for nausea or vomiting. (Patient not taking: Reported on 03/21/2022) 20 tablet 0  ? ?No current facility-administered medications for this visit.  ? ? ?OBJECTIVE: ?Vitals:  ? 03/21/22 0957  ?BP: 106/63  ?Pulse: 85  ?Resp: 16  ?Temp: 98.3 ?F (36.8 ?C)  ?SpO2: 100%  ? ? ?   Body mass index is 26.17 kg/m?Marland Kitchen    ECOG FS:0 - Asymptomatic ? ?General: Well-developed, well-nourished, no acute distress. ?Eyes: Pink conjunctiva, anicteric sclera. ?HEENT: Normocephalic, moist mucous membranes. ?Lungs: No audible wheezing or coughing. ?Heart: Regular rate and rhythm. ?Abdomen: Soft, nontender, no obvious distention. ?Musculoskeletal: No edema, cyanosis, or clubbing. ?Neuro: Alert, answering all questions appropriately. Cranial nerves grossly intact. ?Skin: No rashes or petechiae noted. ?Psych: Normal affect. ? ?LAB RESULTS: ? ?Lab Results  ?Component Value Date  ? NA 133 (L) 03/15/2022  ? K 4.6 03/15/2022  ? CL 97 (L) 03/15/2022  ? CO2 28 03/15/2022  ? GLUCOSE 316 (H) 03/15/2022  ? BUN 15 03/15/2022  ? CREATININE 0.89 03/15/2022  ? CALCIUM 8.8 (L) 03/15/2022  ? PROT 7.1 03/15/2022  ? ALBUMIN 3.6 03/15/2022  ? AST 16 03/15/2022  ? ALT 17 03/15/2022  ? ALKPHOS 67 03/15/2022  ? BILITOT 0.2 (L) 03/15/2022  ? GFRNONAA >60 03/15/2022  ? ? ?Lab Results  ?Component Value Date  ? WBC 6.2 03/15/2022  ?  NEUTROABS 5.2 03/15/2022  ? HGB 10.7 (L) 03/15/2022  ? HCT 33.0 (L) 03/15/2022  ? MCV 89.9 03/15/2022  ? PLT 295 03/15/2022  ? ?Lab Results  ?Component Value Date  ? IRON 26 (L) 12/21/2021  ? TIBC 325 12/21/2021  ? IRONPCTSAT 8 (L) 12/21/2021  ? ?Lab Results  ?Component Value Date  ? FERRITIN 15 (L) 12/21/2021  ? ? ? ?STUDIES: ?DG Abd 1 View ? ?Result Date: 03/01/2022 ?CLINICAL DATA:  Generalized abdominal pain.  Constipation. EXAM: ABDOMEN - 1 VIEW COMPARISON:  None. FINDINGS: Moderate fecal loading is identified throughout the colon, particularly in the region of the cecum and rectum. No bowel obstruction. No other abnormalities. IMPRESSION: Moderate fecal loading as above. Electronically Signed   By: Dorise Bullion III M.D.   On: 03/01/2022 13:06  ? ?CT CHEST ABDOMEN PELVIS W CONTRAST ? ?Result Date: 03/19/2022 ?CLINICAL DATA:  Esophageal cancer restaging. 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 Previous chemotherapy and radiation therapy. The patient is preoperative for esophageal surgery. * Tracking Code: BO * EXAM: CT CHEST, ABDOMEN, AND  PELVIS WITH CONTRAST TECHNIQUE: Multidetector CT imaging of the chest, abdomen and pelvis was performed following the standard protocol during bolus administration of intravenous contrast. RADIATION DOSE REDUCTION: This exam was performed according to the departmental dose-optimization program which includes automated exposure control, adjustment of the mA and/or kV according to patient size and/or use of iterative reconstruction technique. CONTRAST:  178m OMNIPAQUE IOHEXOL 300 MG/ML  SOLN COMPARISON:  Multiple exams, including 11/15/2021 and PET-CT from 12/13/2021 FINDINGS: CT CHEST FINDINGS Cardiovascular: Atherosclerotic calcification of the aortic arch and left anterior descending coronary artery. Mediastinum/Nodes: Wall thickening in the distal esophagus at the gastroesophageal junction and extending into the gastric cardia compatible with  known mass. Reduced prominence of the distal esophageal component of the mass compared to 11/15/2021. No pathologic thoracic adenopathy. Lungs/Pleura: Small triangular subpleural lymph node along the mino

## 2022-03-20 ENCOUNTER — Telehealth: Payer: Self-pay | Admitting: Pharmacist

## 2022-03-20 ENCOUNTER — Ambulatory Visit: Payer: Self-pay | Admitting: Oncology

## 2022-03-20 NOTE — Telephone Encounter (Signed)
Patient failed to provide requested 2023 financial documentation. No additional medication assistance will be provided by [facility] without the required proof of income documentation. Patient notified by letter. ?Samuel Pennington ?Web designer ?[facility] ?

## 2022-03-20 NOTE — Telephone Encounter (Signed)
Patient failed to provide requested 2023 financial documentation. No additional medication assistance will be provided by [facility] without the required proof of income documentation. Patient notified by letter. ?Vic Ripper ?Web designer ?[facility] ?

## 2022-03-21 ENCOUNTER — Ambulatory Visit
Admission: RE | Admit: 2022-03-21 | Discharge: 2022-03-21 | Disposition: A | Discharge status: Home / Self Care | Payer: Self-pay | Source: Ambulatory Visit | Attending: Radiation Oncology | Admitting: Radiation Oncology

## 2022-03-21 ENCOUNTER — Inpatient Hospital Stay: Payer: Self-pay

## 2022-03-21 ENCOUNTER — Other Ambulatory Visit: Payer: Self-pay | Admitting: *Deleted

## 2022-03-21 ENCOUNTER — Inpatient Hospital Stay (HOSPITAL_BASED_OUTPATIENT_CLINIC_OR_DEPARTMENT_OTHER): Payer: Self-pay | Admitting: Oncology

## 2022-03-21 ENCOUNTER — Encounter: Payer: Self-pay | Admitting: Oncology

## 2022-03-21 VITALS — BP 122/64 | HR 82 | Temp 98.4°F | Resp 18 | Ht 62.0 in | Wt 144.0 lb

## 2022-03-21 VITALS — BP 106/63 | HR 85 | Temp 98.3°F | Resp 16 | Ht 62.0 in | Wt 143.1 lb

## 2022-03-21 DIAGNOSIS — C159 Malignant neoplasm of esophagus, unspecified: Secondary | ICD-10-CM

## 2022-03-21 DIAGNOSIS — R110 Nausea: Secondary | ICD-10-CM | POA: Insufficient documentation

## 2022-03-21 DIAGNOSIS — R1310 Dysphagia, unspecified: Secondary | ICD-10-CM | POA: Insufficient documentation

## 2022-03-21 DIAGNOSIS — R1013 Epigastric pain: Secondary | ICD-10-CM | POA: Insufficient documentation

## 2022-03-21 MED ORDER — ALPRAZOLAM 0.25 MG PO TABS: 0.2500 mg | ORAL_TABLET | Freq: Every evening | ORAL | 0 refills | Status: DC | PRN

## 2022-03-21 NOTE — Progress Notes (Signed)
Radiation Oncology ?Follow up Note ? ?Name: Samuel Pennington   ?Date:   03/21/2022 ?MRN:  [id number] ?DOB: January 18, 1966  ? ? ?This 56 y.o. 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 ? ?REFERRING PROVIDER: Revelo, Elyse Jarvis* ? ?HPI: Patient is a Spanish-speaking 56 year old male accompanied by interpreter seen today 1 month out having completed concurrent chemoradiation therapy for stage II poorly differentiated squamous cell carcinoma the GE junction..  Earlier this month he was having significant problems with nausea dyspepsia and was seen by symptomatic event with alterations of some of his medications and has had excellent response to that with improved dysphagia and overall epigastric pain.  He has met with the [facility] surgeons and is scheduled in May for surgery. ? ?COMPLICATIONS OF TREATMENT: none ? ?FOLLOW UP COMPLIANCE: keeps appointments  ? ?PHYSICAL EXAM:  ?BP 122/64   Pulse 82   Temp 98.4 ?F (36.9 ?C)   Resp 18   Ht _0  (1.575 m)   Wt 144 lb (65.3 kg)   BMI 26.34 kg/m?  ?Well-developed well-nourished patient in NAD. HEENT reveals PERLA, EOMI, discs not visualized.  Oral cavity is clear. No oral mucosal lesions are identified. Neck is clear without evidence of cervical or supraclavicular adenopathy. Lungs are clear to A&P. Cardiac examination is essentially unremarkable with regular rate and rhythm without murmur rub or thrill. Abdomen is benign with no organomegaly or masses noted. Motor sensory and DTR levels are equal and symmetric in the upper and lower extremities. Cranial nerves II through XII are grossly intact. Proprioception is intact. No peripheral adenopathy or edema is identified. No motor or sensory levels are noted. Crude visual fields are within normal range. ? ?RADIOLOGY RESULTS: No current films for review ? ?PLAN: Present time patient  will undergo surgery at [facility] and I will follow-up with him in about 4 months with reevaluation of his pathology report.  I have gone over the risks and reasonings for his surgery and he agrees to proceed with that with curative intent.  I am otherwise pleased with his overall progress and improvement of his symptoms. ? ?I would like to take this opportunity to thank you for allowing me to participate in the care of your patient.. ?  ? Noreene Filbert, MD ? ?

## 2022-03-21 NOTE — Telephone Encounter (Signed)
I have attempted several times to reach patient, but his number is not working. I called interpreter services and spoke with Kennyth Lose who is familiar with patient and wife snd she had a couple of other numbers to try, but got no answer there either, She will leave a message on wife's voice mail just asking her to return her call and when she calls her back she will ask her which pharmacy they want to use and call me back ?

## 2022-03-21 NOTE — Telephone Encounter (Signed)
Pharmacy called stating that they cannot get Xanax at their pharmacy, but that they do have other benzo's if you want to order something different, or you can send to another pharmacy of patient choice. I tried calling pharmacy to find out what medications they have available in comparison, but had to leave a message for them to call me back ?

## 2022-03-21 NOTE — Progress Notes (Unsigned)
Nutrition ? ?Patient was discharged from clinic without seeing RD.   ? ?RD tried to call patient this afternoon but message stated that call could not be completed as dialed.   ? ?Chart reviewed and noted "90% improvement after starting olanzapine and protonix.  Patient has completed treatment.  Planning surgery at [facility] in May.   ? ?Weight today 143 lb  ? ?Anticipate oral intake and weight to continue to increase with control of reflux.  ? ?RD available as needed. ? ?Samuel Pennington B. Zenia Resides, RD, LDN ?Registered Dietitian ?[phone] ? ?

## 2022-03-22 ENCOUNTER — Encounter: Payer: Self-pay | Admitting: Oncology

## 2022-03-22 ENCOUNTER — Other Ambulatory Visit: Payer: Self-pay | Admitting: *Deleted

## 2022-03-22 MED ORDER — ALPRAZOLAM 0.25 MG PO TABS: 0.2500 mg | ORAL_TABLET | Freq: Every evening | ORAL | 0 refills | Status: AC | PRN

## 2022-04-04 ENCOUNTER — Telehealth: Payer: Self-pay

## 2022-04-04 NOTE — Telephone Encounter (Signed)
Surgery has been scheduled for 05/18/22 with Dr. Maryjane Hurter at [facility]. Message sent to scheduling to arrange a follow up with Dr. Grayland Ormond following surgery. ?

## 2022-04-09 ENCOUNTER — Encounter: Payer: Self-pay | Admitting: Emergency Medicine

## 2022-04-09 ENCOUNTER — Other Ambulatory Visit: Payer: Self-pay | Admitting: *Deleted

## 2022-04-10 ENCOUNTER — Encounter: Payer: Self-pay | Admitting: Oncology

## 2022-04-10 MED ORDER — OLANZAPINE 5 MG PO TABS: 5.0000 mg | ORAL_TABLET | Freq: Every evening | ORAL | 1 refills | Status: AC | PRN

## 2022-04-17 ENCOUNTER — Other Ambulatory Visit: Payer: Self-pay

## 2022-04-17 ENCOUNTER — Encounter: Payer: Self-pay | Admitting: Emergency Medicine

## 2022-04-17 DIAGNOSIS — R55 Syncope and collapse: Secondary | ICD-10-CM | POA: Diagnosis present

## 2022-04-17 DIAGNOSIS — Z79899 Other long term (current) drug therapy: Secondary | ICD-10-CM

## 2022-04-17 DIAGNOSIS — C154 Malignant neoplasm of middle third of esophagus: Secondary | ICD-10-CM | POA: Diagnosis present

## 2022-04-17 DIAGNOSIS — E785 Hyperlipidemia, unspecified: Secondary | ICD-10-CM | POA: Diagnosis present

## 2022-04-17 DIAGNOSIS — K254 Chronic or unspecified gastric ulcer with hemorrhage: Principal | ICD-10-CM | POA: Diagnosis present

## 2022-04-17 DIAGNOSIS — K5900 Constipation, unspecified: Secondary | ICD-10-CM | POA: Diagnosis present

## 2022-04-17 DIAGNOSIS — E119 Type 2 diabetes mellitus without complications: Secondary | ICD-10-CM | POA: Diagnosis present

## 2022-04-17 DIAGNOSIS — K219 Gastro-esophageal reflux disease without esophagitis: Secondary | ICD-10-CM | POA: Diagnosis present

## 2022-04-17 DIAGNOSIS — D62 Acute posthemorrhagic anemia: Secondary | ICD-10-CM | POA: Diagnosis present

## 2022-04-17 DIAGNOSIS — Z7984 Long term (current) use of oral hypoglycemic drugs: Secondary | ICD-10-CM

## 2022-04-17 NOTE — ED Triage Notes (Signed)
Patient ambulatory to triage with steady gait, without difficulty or distress noted; pt reports u abd pain tonight; st rectal bleeding noted since last wk; hx esophageal CA but receiving no tx at present ?

## 2022-04-18 ENCOUNTER — Inpatient Hospital Stay
Admission: EM | Admit: 2022-04-18 | Discharge: 2022-04-21 | DRG: 378 | Disposition: A | Discharge status: Home / Self Care | Payer: Self-pay | Attending: Internal Medicine | Admitting: Internal Medicine

## 2022-04-18 DIAGNOSIS — Z794 Long term (current) use of insulin: Secondary | ICD-10-CM

## 2022-04-18 DIAGNOSIS — R55 Syncope and collapse: Secondary | ICD-10-CM

## 2022-04-18 DIAGNOSIS — K921 Melena: Principal | ICD-10-CM

## 2022-04-18 DIAGNOSIS — C159 Malignant neoplasm of esophagus, unspecified: Secondary | ICD-10-CM

## 2022-04-18 DIAGNOSIS — E1169 Type 2 diabetes mellitus with other specified complication: Secondary | ICD-10-CM

## 2022-04-18 DIAGNOSIS — K219 Gastro-esophageal reflux disease without esophagitis: Secondary | ICD-10-CM

## 2022-04-18 DIAGNOSIS — K922 Gastrointestinal hemorrhage, unspecified: Secondary | ICD-10-CM

## 2022-04-18 DIAGNOSIS — E119 Type 2 diabetes mellitus without complications: Secondary | ICD-10-CM

## 2022-04-18 DIAGNOSIS — D62 Acute posthemorrhagic anemia: Secondary | ICD-10-CM

## 2022-04-18 DIAGNOSIS — R42 Dizziness and giddiness: Secondary | ICD-10-CM

## 2022-04-18 DIAGNOSIS — D649 Anemia, unspecified: Secondary | ICD-10-CM

## 2022-04-18 LAB — CBC WITH DIFFERENTIAL/PLATELET
Abs Immature Granulocytes: 0.01 10*3/uL (ref 0.00–0.07)
Basophils Absolute: 0 10*3/uL (ref 0.0–0.1)
Basophils Relative: 0 %
Eosinophils Absolute: 0.2 10*3/uL (ref 0.0–0.5)
Eosinophils Relative: 4 %
HCT: 18 % — ABNORMAL LOW (ref 39.0–52.0)
Hemoglobin: 5.9 g/dL — ABNORMAL LOW (ref 13.0–17.0)
Immature Granulocytes: 0 %
Lymphocytes Relative: 19 %
Lymphs Abs: 0.8 10*3/uL (ref 0.7–4.0)
MCH: 32.2 pg (ref 26.0–34.0)
MCHC: 32.8 g/dL (ref 30.0–36.0)
MCV: 98.4 fL (ref 80.0–100.0)
Monocytes Absolute: 0.4 10*3/uL (ref 0.1–1.0)
Monocytes Relative: 9 %
Neutro Abs: 3 10*3/uL (ref 1.7–7.7)
Neutrophils Relative %: 68 %
Platelets: 177 10*3/uL (ref 150–400)
RBC: 1.83 MIL/uL — ABNORMAL LOW (ref 4.22–5.81)
RDW: 19.4 % — ABNORMAL HIGH (ref 11.5–15.5)
WBC: 4.5 10*3/uL (ref 4.0–10.5)
nRBC: 0 % (ref 0.0–0.2)

## 2022-04-18 LAB — LIPASE, BLOOD: Lipase: 30 U/L (ref 11–51)

## 2022-04-18 LAB — IRON AND TIBC
Iron: 28 ug/dL — ABNORMAL LOW (ref 45–182)
Saturation Ratios: 8 % — ABNORMAL LOW (ref 17.9–39.5)
TIBC: 333 ug/dL (ref 250–450)
UIBC: 305 ug/dL

## 2022-04-18 LAB — COMPREHENSIVE METABOLIC PANEL
ALT: 16 U/L (ref 0–44)
AST: 21 U/L (ref 15–41)
Albumin: 3.5 g/dL (ref 3.5–5.0)
Alkaline Phosphatase: 58 U/L (ref 38–126)
Anion gap: 5 (ref 5–15)
BUN: 16 mg/dL (ref 6–20)
CO2: 25 mmol/L (ref 22–32)
Calcium: 8.5 mg/dL — ABNORMAL LOW (ref 8.9–10.3)
Chloride: 109 mmol/L (ref 98–111)
Creatinine, Ser: 0.68 mg/dL (ref 0.61–1.24)
GFR, Estimated: >60 mL/min (ref >60)
Glucose, Bld: 145 mg/dL — ABNORMAL HIGH (ref 70–99)
Potassium: 4.1 mmol/L (ref 3.5–5.1)
Sodium: 139 mmol/L (ref 135–145)
Total Bilirubin: 0.4 mg/dL (ref 0.3–1.2)
Total Protein: 6.2 g/dL — ABNORMAL LOW (ref 6.5–8.1)

## 2022-04-18 LAB — HEMOGLOBIN AND HEMATOCRIT, BLOOD
HCT: 22.6 % — ABNORMAL LOW (ref 39.0–52.0)
HCT: 22.1 % — ABNORMAL LOW (ref 39.0–52.0)
Hemoglobin: 7.2 g/dL — ABNORMAL LOW (ref 13.0–17.0)
Hemoglobin: 7.1 g/dL — ABNORMAL LOW (ref 13.0–17.0)

## 2022-04-18 LAB — FERRITIN: Ferritin: 6 ng/mL — ABNORMAL LOW (ref 24–336)

## 2022-04-18 LAB — URINALYSIS, ROUTINE W REFLEX MICROSCOPIC
Bilirubin Urine: NEGATIVE
Glucose, UA: NEGATIVE mg/dL
Hgb urine dipstick: NEGATIVE
Ketones, ur: NEGATIVE mg/dL
Leukocytes,Ua: NEGATIVE
Nitrite: NEGATIVE
Protein, ur: NEGATIVE mg/dL
Specific Gravity, Urine: 1.009 (ref 1.005–1.030)
pH: 6 (ref 5.0–8.0)

## 2022-04-18 LAB — CBG MONITORING, ED
Glucose-Capillary: 241 mg/dL — ABNORMAL HIGH (ref 70–99)
Glucose-Capillary: 118 mg/dL — ABNORMAL HIGH (ref 70–99)
Glucose-Capillary: 104 mg/dL — ABNORMAL HIGH (ref 70–99)
Glucose-Capillary: 134 mg/dL — ABNORMAL HIGH (ref 70–99)
Glucose-Capillary: 76 mg/dL (ref 70–99)

## 2022-04-18 LAB — VITAMIN B12: Vitamin B-12: 496 pg/mL (ref 180–914)

## 2022-04-18 LAB — CBC
HCT: 22.5 % — ABNORMAL LOW (ref 39.0–52.0)
Hemoglobin: 7.2 g/dL — ABNORMAL LOW (ref 13.0–17.0)
MCH: 29.5 pg (ref 26.0–34.0)
MCHC: 32 g/dL (ref 30.0–36.0)
MCV: 92.2 fL (ref 80.0–100.0)
Platelets: 141 10*3/uL — ABNORMAL LOW (ref 150–400)
RBC: 2.44 MIL/uL — ABNORMAL LOW (ref 4.22–5.81)
RDW: 19.5 % — ABNORMAL HIGH (ref 11.5–15.5)
WBC: 3 10*3/uL — ABNORMAL LOW (ref 4.0–10.5)
nRBC: 0 % (ref 0.0–0.2)

## 2022-04-18 LAB — PREPARE RBC (CROSSMATCH)

## 2022-04-18 MED ORDER — ATORVASTATIN CALCIUM 20 MG PO TABS
40.0000 mg | ORAL_TABLET | Freq: Every day | ORAL | Status: DC
  Administered 2022-04-18 – 2022-04-21 (×4): 40 mg via ORAL
  Filled 2022-04-18 (×4): qty 2

## 2022-04-18 MED ORDER — SODIUM CHLORIDE 0.9 % IV SOLN: INTRAVENOUS | Status: DC

## 2022-04-18 MED ORDER — IPRATROPIUM-ALBUTEROL 0.5-2.5 (3) MG/3ML IN SOLN: 3.0000 mL | RESPIRATORY_TRACT | Status: DC | PRN

## 2022-04-18 MED ORDER — PANTOPRAZOLE INFUSION (NEW) - SIMPLE MED
8.0000 mg/h | INTRAVENOUS | Status: AC
  Administered 2022-04-18 – 2022-04-20 (×7): 8 mg/h via INTRAVENOUS
  Filled 2022-04-18 (×9): qty 100

## 2022-04-18 MED ORDER — ONDANSETRON HCL 4 MG/2ML IJ SOLN
4.0000 mg | Freq: Four times a day (QID) | INTRAMUSCULAR | Status: DC | PRN
Start: 2022-04-18 — End: 2022-04-21

## 2022-04-18 MED ORDER — OLANZAPINE 5 MG PO TABS
5.0000 mg | ORAL_TABLET | Freq: Every evening | ORAL | Status: DC | PRN
  Filled 2022-04-18: qty 1

## 2022-04-18 MED ORDER — ALPRAZOLAM 0.25 MG PO TABS: 0.2500 mg | ORAL_TABLET | Freq: Every evening | ORAL | Status: DC | PRN

## 2022-04-18 MED ORDER — ACETAMINOPHEN 650 MG RE SUPP: 650.0000 mg | Freq: Four times a day (QID) | RECTAL | Status: DC | PRN

## 2022-04-18 MED ORDER — SODIUM CHLORIDE 0.9 % IV SOLN
510.0000 mg | Freq: Once | INTRAVENOUS | Status: AC
  Administered 2022-04-18: 510 mg via INTRAVENOUS
  Filled 2022-04-18: qty 17

## 2022-04-18 MED ORDER — SENNOSIDES-DOCUSATE SODIUM 8.6-50 MG PO TABS: 1.0000 | ORAL_TABLET | Freq: Every evening | ORAL | Status: DC | PRN

## 2022-04-18 MED ORDER — HYDRALAZINE HCL 20 MG/ML IJ SOLN: 10.0000 mg | INTRAMUSCULAR | Status: DC | PRN

## 2022-04-18 MED ORDER — METOPROLOL TARTRATE 5 MG/5ML IV SOLN
5.0000 mg | INTRAVENOUS | Status: DC | PRN
Start: 2022-04-18 — End: 2022-04-21

## 2022-04-18 MED ORDER — DM-GUAIFENESIN ER 30-600 MG PO TB12: 1.0000 | ORAL_TABLET | Freq: Two times a day (BID) | ORAL | Status: DC | PRN

## 2022-04-18 MED ORDER — GLIPIZIDE ER 5 MG PO TB24
5.0000 mg | ORAL_TABLET | Freq: Every day | ORAL | Status: DC
  Administered 2022-04-18: 5 mg via ORAL
  Filled 2022-04-18 (×2): qty 1

## 2022-04-18 MED ORDER — SODIUM CHLORIDE 0.9 % IV SOLN
10.0000 mL/h | Freq: Once | INTRAVENOUS | Status: AC
Start: 2022-04-18 — End: 2022-04-18
  Administered 2022-04-18: 10 mL/h via INTRAVENOUS

## 2022-04-18 MED ORDER — INSULIN ASPART 100 UNIT/ML IJ SOLN
0.0000 [IU] | Freq: Three times a day (TID) | INTRAMUSCULAR | Status: DC
  Administered 2022-04-18: 3 [IU] via SUBCUTANEOUS
  Administered 2022-04-20: 2 [IU] via SUBCUTANEOUS
  Filled 2022-04-18 (×2): qty 1

## 2022-04-18 MED ORDER — ACETAMINOPHEN 325 MG PO TABS: 650.0000 mg | ORAL_TABLET | Freq: Four times a day (QID) | ORAL | Status: DC | PRN

## 2022-04-18 MED ORDER — OXYCODONE HCL 5 MG PO TABS
10.0000 mg | ORAL_TABLET | Freq: Four times a day (QID) | ORAL | Status: DC | PRN
  Administered 2022-04-18 – 2022-04-21 (×8): 10 mg via ORAL
  Filled 2022-04-18 (×9): qty 2

## 2022-04-18 MED ORDER — PANTOPRAZOLE SODIUM 40 MG IV SOLR: 40.0000 mg | Freq: Two times a day (BID) | INTRAVENOUS | Status: DC

## 2022-04-18 MED ORDER — PANTOPRAZOLE 80MG IVPB - SIMPLE MED
80.0000 mg | Freq: Once | INTRAVENOUS | Status: AC
  Administered 2022-04-18: 80 mg via INTRAVENOUS
  Filled 2022-04-18: qty 100

## 2022-04-18 MED ORDER — ONDANSETRON HCL 4 MG PO TABS: 4.0000 mg | ORAL_TABLET | Freq: Four times a day (QID) | ORAL | Status: DC | PRN

## 2022-04-18 MED ORDER — TRAZODONE HCL 50 MG PO TABS
25.0000 mg | ORAL_TABLET | Freq: Every evening | ORAL | Status: DC | PRN
  Administered 2022-04-20: 25 mg via ORAL
  Filled 2022-04-18: qty 1

## 2022-04-18 NOTE — ED Notes (Signed)
Blood transfusion complete at this time. Morning labs can be collected at 8:35 am  ?

## 2022-04-18 NOTE — Consult Note (Signed)
?  ?Samuel Pennington , MD ?[address] [place], [place], [place], [address] ?[address] [place], [place], [place], [address] ?Phone: [phone]  ?Fax: [phone] ? Consultation ? ?Referring Provider:    ER ?Primary Care Physician:  Theotis Burrow, MD ?Primary Gastroenterologist:  Dr. Mont Dutton         ?Reason for Consultation:     GI bleed ? ?Date of Admission:  04/18/2022 ?Date of Consultation:  04/18/2022 ?       ? HPI:   ?Samuel Pennington is a 56 y.o. male With a history of esophageal squamous cell carcinoma diabetes present to the emergency room with melena, generalized weakness and syncope on standing.  In the ER his hemoglobin was 5.9 g compared to 10.7 g on 03/15/2022.  Admitted for GI bleed and I was consulted. ? ?He follows with Dr. Grayland Ormond last seen at their office on 03/21/2022.  He has stage III poorly differentiated squamous cell carcinoma.  No metastatic disease besides esophageal mass completed 6 cycles of chemotherapy plan is for resection in May. ? ?He states that about a week back he had black tarry stools for 4 to 5 days.  Since then has not had a single bowel movement.  Then he changed his mind and told me cannot look at his bowel movements.  He definitely has not had a bowel movement in the last day or 2.  Denies any NSAID use.  Denies any hematemesis.  Denies any abdominal pain. ? ?Past Medical History:  ?Diagnosis Date  ? Cancer [facility])   ? Diabetes mellitus without complication ([place])   ? Hyperlipidemia   ? Neuromuscular disorder ([place])   ? ? ?Past Surgical History:  ?Procedure Laterality Date  ? COLONOSCOPY WITH PROPOFOL N/A 11/14/2021  ? Procedure: COLONOSCOPY WITH PROPOFOL;  Surgeon: Lesly Rubenstein, MD;  Location: [facility] ENDOSCOPY;  Service: Endoscopy;  Laterality: N/A;  ? ESOPHAGOGASTRODUODENOSCOPY (EGD) WITH PROPOFOL N/A 11/14/2021  ? Procedure: ESOPHAGOGASTRODUODENOSCOPY (EGD) WITH PROPOFOL;  Surgeon: Lesly Rubenstein, MD;  Location: [facility] ENDOSCOPY;  Service: Endoscopy;   Laterality: N/A;  ? EUS N/A 12/14/2021  ? Procedure: FULL UPPER ENDOSCOPIC ULTRASOUND (EUS) RADIAL;  Surgeon: Holly Bodily, MD;  Location: [facility] ENDOSCOPY;  Service: Gastroenterology;  Laterality: N/A;  SPANISH INTERPRETER  ? ? ?Prior to Admission medications   ?Medication Sig Start Date End Date Taking? Authorizing Provider  ?ALPRAZolam (XANAX) 0.25 MG tablet Take 1 tablet (0.25 mg total) by mouth at bedtime as needed for anxiety. 03/22/22  Yes Lloyd Huger, MD  ?glipiZIDE (GLUCOTROL XL) 5 MG 24 hr tablet TOME 1 TABLETA POR BOCA DIARIO PARA LA DIABETES 12/13/20 04/18/22 Yes Revelo, Elyse Jarvis, MD  ?metFORMIN (GLUCOPHAGE) 1000 MG tablet Take 1,000 mg by mouth daily. 08/27/18  Yes Revelo, Elyse Jarvis, MD  ?OLANZapine (ZYPREXA) 5 MG tablet Take 1 tablet (5 mg total) by mouth at bedtime as needed (nausea). 04/10/22  Yes Borders, Kirt Boys, NP  ?oxyCODONE (OXY IR/ROXICODONE) 5 MG immediate release tablet Take 10 mg by mouth every 6 (six) hours as needed for pain. 11/06/21  Yes [provider]  ?pantoprazole (PROTONIX) 40 MG tablet Take 1 tablet (40 mg total) by mouth daily. 03/15/22  Yes Borders, Kirt Boys, NP  ?atorvastatin (LIPITOR) 40 MG tablet TOMA UNA TABLETA POR BOCA AL ACOSTARSE PARA COLESTEROL ALTO 07/06/20 03/21/22  Revelo, Elyse Jarvis, MD  ?cyclobenzaprine (FLEXERIL) 10 MG tablet Take 1 tablet (10 mg total) by mouth 3 (three) times daily as needed. ?Patient not  taking: Reported on 03/21/2022 02/09/22   Versie Starks, PA-C  ?lidocaine-prilocaine (EMLA) cream Apply to affected area once ?Patient not taking: Reported on 03/21/2022 12/22/21   Lloyd Huger, MD  ?ondansetron (ZOFRAN) 8 MG tablet Take 1 tablet (8 mg total) by mouth every 6 (six) hours as needed for nausea or vomiting. ?Patient not taking: Reported on 03/21/2022 03/14/22   Lloyd Huger, MD  ?sucralfate (CARAFATE) 1 g tablet Take 1 tablet (1 g total) by mouth 4 (four) times daily -  with meals and at bedtime. ?Patient not  taking: Reported on 04/18/2022 02/26/22   Hughie Closs, PA-C  ? ? ?Family History  ?Problem Relation Age of Onset  ? Hypertension Mother   ?  ? ?Social History  ? ?Tobacco Use  ? Smoking status: Never  ? Smokeless tobacco: Never  ?Vaping Use  ? Vaping Use: Never used  ?Substance Use Topics  ? Alcohol use: Yes  ?  Comment: occassionallh  ? Drug use: Never  ? ? ?Allergies as of 04/17/2022  ? (No Known Allergies)  ? ? ?Review of Systems:    ?All systems reviewed and negative except where noted in HPI. ? ? Physical Exam:  ?Vital signs in last 24 hours: ?Temp:  [98 ?F (36.7 ?C)-98.5 ?F (36.9 ?C)] 98.1 ?F (36.7 ?C) (05/10 1430) ?Pulse Rate:  [59-101] 59 (05/10 1400) ?Resp:  [11-24] 12 (05/10 1400) ?BP: (87-134)/(47-67) 88/47 (05/10 1400) ?SpO2:  [97 %-100 %] 99 % (05/10 1400) ?Weight:  [65.8 kg] 65.8 kg (05/09 2357) ?  ?General:   Pleasant, cooperative in NAD ?Head:  Normocephalic and atraumatic. ?Eyes:   No icterus.   Conjunctiva pink. PERRLA. ?Ears:  Normal auditory acuity. ?Neck:  Supple; no masses or thyroidomegaly ?Lungs: Respirations even and unlabored. Lungs clear to auscultation bilaterally.   No wheezes, crackles, or rhonchi.  ?Heart:  Regular rate and rhythm;  Without murmur, clicks, rubs or gallops ?Abdomen:  Soft, nondistended, nontender. Normal bowel sounds. No appreciable masses or hepatomegaly.  No rebound or guarding.  ?Neurologic:  Alert and oriented x3;  grossly normal neurologically. ?Skin:  Intact without significant lesions or rashes. ?Cervical Nodes:  No significant cervical adenopathy. ?Psych:  Alert and cooperative. Normal affect. ? ?LAB RESULTS: ?Recent Labs  ?  04/18/22 ?0001 04/18/22 ?6789  ?WBC 4.5  --   ?HGB 5.9* 7.2*  ?HCT 18.0* 22.6*  ?PLT 177  --   ? ?BMET ?Recent Labs  ?  04/18/22 ?0001  ?NA 139  ?K 4.1  ?CL 109  ?CO2 25  ?GLUCOSE 145*  ?BUN 16  ?CREATININE 0.68  ?CALCIUM 8.5*  ? ?LFT ?Recent Labs  ?  04/18/22 ?0001  ?PROT 6.2*  ?ALBUMIN 3.5  ?AST 21  ?ALT 16  ?ALKPHOS 58  ?BILITOT 0.4   ? ?PT/INR ?No results for input(s): LABPROT, INR in the last 72 hours. ? ?STUDIES: ?No results found. ? ? ? Impression / Plan:  ? ?Storm Sovine is a 56 y.o. y/o male with history of esophageal squamous cell carcinoma affecting the middle third of the esophagus diagnosed in December 2022.  No evidence of metastatic disease completed 6 cycles of chemotherapy plan is for surgery in a few weeks present to the emergency room with melena hemoglobin of 5.9 g drop of over 5 g from baseline.  Very likely has blood loss from the malignancy. ? ?Plan ?1.  Monitor CBC transfuse as needed.  Maintain 2 large IV large-bore cannulas ?2.  IV PPI ?3.  Once hemoglobin is over 7 g we will plan for endoscopy tomorrow and apply Hemospray over the malignant lesion to reduce the loss of blood unless anything else is seen.  Then he should be seen for discharge interval by [facility] and if surgery is delayed could consider cryo therapy modality to prevent blood loss while awaiting surgery. ? ? ?I have discussed alternative options, risks & benefits,  which include, but are not limited to, bleeding, infection, perforation,respiratory complication & drug reaction.  The patient agrees with this plan & written consent will be obtained.   ? ? ?Thank you for involving me in the care of this patient.   ? ? ? LOS: 0 days  ? ?Samuel Bellows, MD  04/18/2022, 2:43 PM ? ?   ?

## 2022-04-18 NOTE — Progress Notes (Signed)
?PROGRESS NOTE ? ? ? ?Samuel Pennington  ZHY:[id number] DOB: 23-Nov-1966 DOA: 04/18/2022 ?PCP: Theotis Burrow, MD  ? ?Brief Narrative:  ?56 year old with history of esophageal adenocarcinoma, DM2, HLD who is expected to have surgical intervention for his cancer in June at [facility] admitted for acute onset melena with generalized weakness and presyncope.  Upon admission he was slightly tachycardic, blood loss anemia of hemoglobin 5.9, baseline 10.7.  Patient was started on IV PPI drip and GI team was consulted. ? ? ?Assessment & Plan: ? Principal Problem: ?  GI bleeding ?Active Problems: ?  Acute blood loss anemia ?  Squamous cell esophageal cancer (HCC) ?  DM2 (diabetes mellitus, type 2) ([place]) ?  GERD without esophagitis ?  ? ? ?Assessment and Plan: ?* GI bleeding ?Acute blood loss anemia, admission hemoglobin 5.9 ?-Unknown etiology.  Currently on PPI drip.  GI team consulted ?- Status post units PRBC transfusion, may require more transfusion ?-We will change him to n.p.o. ?- Iron studies show severely low ferritin, IV iron ordered. ? ?Presyncope ?- Suspect related to blood loss and anemia ? 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 ? ?DM2 (diabetes mellitus, type 2) ([place]) ?-Currently on sliding scale and Accu-Chek.  Glipizide 5 mg daily.  Metformin on hold. ? ?GERD without esophagitis ?- Continue PPI therapy as well as Carafate and olanzapine. ? ? ? ?DVT prophylaxis: SCDs ?Code Status: Full ?Family Communication:   ? ?Status is: Inpatient ?Remains inpatient appropriate because: Maintain hospital stay for GI evaluation.  In the meantime getting IV iron and as needed transfusion ? ? ? ? ? ?Subjective: ?Seen and examined at bedside, overall feels tired. ? ? ?Examination: ? ?General exam: Appears calm and  comfortable  ?Respiratory system: Clear to auscultation. Respiratory effort normal. ?Cardiovascular system: S1 & S2 heard, RRR. No JVD, murmurs, rubs, gallops or clicks. No pedal edema. ?Gastrointestinal system: Abdomen is nondistended, soft and nontender. No organomegaly or masses felt. Normal bowel sounds heard. ?Central nervous system: Alert and oriented. No focal neurological deficits. ?Extremities: Symmetric 5 x 5 power. ?Skin: No rashes, lesions or ulcers ?Psychiatry: Judgement and insight appear normal. Mood & affect appropriate.  ? ? ? ?Objective: ?Vitals:  ? 04/18/22 0446 04/18/22 0503 04/18/22 0600 04/18/22 0634  ?BP: (!) 87/57 (!) 92/55 (!) 104/54 (!) 100/58  ?Pulse: 75 77 67 66  ?Resp: '14 16 14 15  '$ ?Temp: 98.3 ?F (36.8 ?C) 98.1 ?F (36.7 ?C)  98 ?F (36.7 ?C)  ?TempSrc: Oral Oral  Oral  ?SpO2:  100% 99% 100%  ?Weight:      ?Height:      ? ?No intake or output data in the 24 hours ending 04/18/22 0815 ?Filed Weights  ? 04/17/22 2357  ?Weight: 65.8 kg  ? ? ? ?Data Reviewed:  ? ?CBC: ?Recent Labs  ?Lab 04/18/22 ?0001  ?WBC 4.5  ?NEUTROABS 3.0  ?HGB 5.9*  ?HCT 18.0*  ?MCV 98.4  ?PLT 177  ? ?Basic Metabolic Panel: ?Recent Labs  ?Lab 04/18/22 ?0001  ?NA 139  ?K 4.1  ?CL 109  ?CO2 25  ?GLUCOSE 145*  ?BUN 16  ?CREATININE 0.68  ?CALCIUM 8.5*  ? ?GFR: ?Estimated Creatinine Clearance: 85.2 mL/min (by C-G formula based on SCr of 0.68 mg/dL). ?Liver Function Tests: ?Recent Labs  ?Lab 04/18/22 ?0001  ?AST  21  ?ALT 16  ?ALKPHOS 58  ?BILITOT 0.4  ?PROT 6.2*  ?ALBUMIN 3.5  ? ?Recent Labs  ?Lab 04/18/22 ?0001  ?LIPASE 30  ? ?No results for input(s): AMMONIA in the last 168 hours. ?Coagulation Profile: ?No results for input(s): INR, PROTIME in the last 168 hours. ?Cardiac Enzymes: ?No results for input(s): CKTOTAL, CKMB, CKMBINDEX, TROPONINI in the last 168 hours. ?BNP (last 3 results) ?No results for input(s): PROBNP in the last 8760 hours. ?HbA1C: ?No results for input(s): HGBA1C in the last 72 hours. ?CBG: ?No results for  input(s): GLUCAP in the last 168 hours. ?Lipid Profile: ?No results for input(s): CHOL, HDL, LDLCALC, TRIG, CHOLHDL, LDLDIRECT in the last 72 hours. ?Thyroid Function Tests: ?No results for input(s): TSH, T4TOTAL, FREET4, T3FREE, THYROIDAB in the last 72 hours. ?Anemia Panel: ?No results for input(s): VITAMINB12, FOLATE, FERRITIN, TIBC, IRON, RETICCTPCT in the last 72 hours. ?Sepsis Labs: ?No results for input(s): PROCALCITON, LATICACIDVEN in the last 168 hours. ? ?No results found for this or any previous visit (from the past 240 hour(s)).  ? ? ? ? ? ?Radiology Studies: ?No results found. ? ? ? ? ? ?Scheduled Meds: ? atorvastatin  40 mg Oral Daily  ? glipiZIDE  5 mg Oral Q breakfast  ? insulin aspart  0-9 Units Subcutaneous TID AC & HS  ? [START ON 04/21/2022] pantoprazole  40 mg Intravenous Q12H  ? ?Continuous Infusions: ? sodium chloride 100 mL/hr at 04/18/22 0358  ? pantoprazole 8 mg/hr (04/18/22 0358)  ? ? ? LOS: 0 days  ? ?Time spent= 35 mins ? ? ? ?Marisa Hage Arsenio Loader, MD ?[facility] ? ?If 7PM-7AM, please contact night-coverage ? ?04/18/2022, 8:15 AM  ? ?

## 2022-04-18 NOTE — Progress Notes (Addendum)
Pt blood sugar at 76 at 2211. Pt on clear liquid. Pt requested for gingerale to drink. Gingerale given. NP Randol Kern made aware. Will continue to monitor. ? ?Update 2336: NP Randol Kern placed order to recheck blood sugar at 0300. Pt blood sugar at 0319 is at 85. NP Randol Kern made aware. Will continue to monitor. ? ?Update 0455: pt is complaining of a 10/10 back pain but is NPO. NP Randol Kern made aware. Will continue to monitor. ? ?Update 0456: NP Randol Kern placed order. Will continue to monitor. ?

## 2022-04-18 NOTE — ED Notes (Signed)
Samuel Pennington (pt's son)  [phone] contact information  ?

## 2022-04-18 NOTE — ED Notes (Signed)
Blood currently infusing, unable to collect morning labs at this time. Will postpone following completion of blood transfusion ?

## 2022-04-18 NOTE — ED Provider Notes (Signed)
? ?[facility] ?Provider Note ? ? ? Event Date/Time  ? First MD Initiated Contact with Patient 04/18/22 0149   ?  (approximate) ? ? ?History  ? ?Abdominal Pain ? ? ?HPI ? ?Samuel Pennington is a 56 y.o. male who presents to the ED for evaluation of Abdominal Pain ?  ?I reviewed medical admission from January.  Known esophageal cancer, was bleeding then and had symptomatic anemia.  Considered Hemospray versus IR intervention, but no active bleeding and managed medically. ?Patient has upcoming surgery in June and at [facility] for removal of this mass. ? ?Patient presents to the ED for evaluation of 1 week of intermittent melena.  Reports a handful of episodes of melena and 1-2 episodes of red stool/hematochezia.  Denies any new abdominal pain.  Denies any other bleeding diatheses such as bleeding gums, nosebleeds or hematuria. ?Reports feeling increasing generalized weakness as well as presyncope with standing.  No falls or syncopal episodes.  No chest pain or shortness of breath. ? ?Physical Exam  ? ?Triage Vital Signs: ?ED Triage Vitals  ?Enc Vitals Group  ?   BP 04/18/22 0000 (!) 134/54  ?   Pulse Rate 04/18/22 0000 (!) 101  ?   Resp 04/18/22 0000 18  ?   Temp 04/18/22 0000 98.5 ?F (36.9 ?C)  ?   Temp Source 04/18/22 0000 Oral  ?   SpO2 04/18/22 0000 97 %  ?   Weight 04/17/22 2357 145 lb (65.8 kg)  ?   Height 04/17/22 2357 '5\' 2"'$  (1.575 m)  ?   Head Circumference --   ?   Peak Flow --   ?   Pain Score 04/17/22 2357 5  ?   Pain Loc --   ?   Pain Edu? --   ?   Excl. in [place]? --   ? ? ?Most recent vital signs: ?Vitals:  ? 04/18/22 0300 04/18/22 0400  ?BP: (!) 93/53 (!) 97/51  ?Pulse: 84 83  ?Resp: (!) 24 15  ?Temp:    ?SpO2: 99% 99%  ? ? ?General: Awake, no distress.  ?CV:  Good peripheral perfusion.  ?Resp:  Normal effort.  ?Abd:  No distention.  Minimal epigastric tenderness without peritoneal features.  Abdomen is otherwise benign. ?MSK:  No deformity noted.  ?Neuro:  No focal deficits  appreciated. ?Other:   ? ? ?ED Results / Procedures / Treatments  ? ?Labs ?(all labs ordered are listed, but only abnormal results are displayed) ?Labs Reviewed  ?CBC WITH DIFFERENTIAL/PLATELET - Abnormal; Notable for the following components:  ?    Result Value  ? RBC 1.83 (*)   ? Hemoglobin 5.9 (*)   ? HCT 18.0 (*)   ? RDW 19.4 (*)   ? All other components within normal limits  ?COMPREHENSIVE METABOLIC PANEL - Abnormal; Notable for the following components:  ? Glucose, Bld 145 (*)   ? Calcium 8.5 (*)   ? Total Protein 6.2 (*)   ? All other components within normal limits  ?URINALYSIS, ROUTINE W REFLEX MICROSCOPIC - Abnormal; Notable for the following components:  ? Color, Urine STRAW (*)   ? APPearance CLEAR (*)   ? All other components within normal limits  ?LIPASE, BLOOD  ?BASIC METABOLIC PANEL  ?CBC  ?HEMOGLOBIN AND HEMATOCRIT, BLOOD  ?HEMOGLOBIN AND HEMATOCRIT, BLOOD  ?TYPE AND SCREEN  ?PREPARE RBC (CROSSMATCH)  ? ? ?EKG ?Sinus tachycardia with rate of 102 bpm.  Rightward axis and normal intervals.  No STEMI. ? ?  RADIOLOGY ? ? ?Official radiology report(s): ?No results found. ? ?PROCEDURES and INTERVENTIONS: ? ?.1-3 Lead EKG Interpretation ?Performed by: Vladimir Crofts, MD ?Authorized by: Vladimir Crofts, MD  ? ?  ECG rate:  101 ?  ECG rate assessment: tachycardic   ?  Rhythm: sinus tachycardia   ?  Ectopy: none   ?  Conduction: normal   ?.Critical Care ?Performed by: Vladimir Crofts, MD ?Authorized by: Vladimir Crofts, MD  ? ?Critical care provider statement:  ?  Critical care time (minutes):  30 ?  Critical care time was exclusive of:  Separately billable procedures and treating other patients ?  Critical care was necessary to treat or prevent imminent or life-threatening deterioration of the following conditions:  Circulatory failure ?  Critical care was time spent personally by me on the following activities:  Development of treatment plan with patient or surrogate, discussions with consultants, evaluation of  patient's response to treatment, examination of patient, ordering and review of laboratory studies, ordering and review of radiographic studies, ordering and performing treatments and interventions, pulse oximetry, re-evaluation of patient's condition and review of old charts ? ?Medications  ?pantoprozole (PROTONIX) 80 mg /NS 100 mL infusion (8 mg/hr Intravenous New Bag/Given 04/18/22 0358)  ?pantoprazole (PROTONIX) injection 40 mg (has no administration in time range)  ?atorvastatin (LIPITOR) tablet 40 mg (has no administration in time range)  ?oxyCODONE (Oxy IR/ROXICODONE) immediate release tablet 10 mg (has no administration in time range)  ?ALPRAZolam (XANAX) tablet 0.25 mg (has no administration in time range)  ?OLANZapine (ZYPREXA) tablet 5 mg (has no administration in time range)  ?glipiZIDE (GLUCOTROL XL) 24 hr tablet 5 mg (has no administration in time range)  ?0.9 %  sodium chloride infusion ( Intravenous New Bag/Given 04/18/22 0358)  ?acetaminophen (TYLENOL) tablet 650 mg (has no administration in time range)  ?  Or  ?acetaminophen (TYLENOL) suppository 650 mg (has no administration in time range)  ?traZODone (DESYREL) tablet 25 mg (has no administration in time range)  ?ondansetron (ZOFRAN) tablet 4 mg (has no administration in time range)  ?  Or  ?ondansetron (ZOFRAN) injection 4 mg (has no administration in time range)  ?pantoprazole (PROTONIX) 80 mg /NS 100 mL IVPB (0 mg Intravenous Stopped 04/18/22 0334)  ?0.9 %  sodium chloride infusion (10 mL/hr Intravenous New Bag/Given 04/18/22 0223)  ? ? ? ?IMPRESSION / MDM / ASSESSMENT AND PLAN / ED COURSE  ?I reviewed the triage vital signs and the nursing notes. ? ?Pleasant 56 year old male presents to the ED with symptoms of symptomatic anemia likely due to bleeding down GI tumor, requiring transfusions and medical admission.  He is tachycardic, improving with blood transfusion.  Hemoglobin is low at 5.9, normal metabolic panel and lipase.  This seems to be a  similar presentation as when he was here in January.  We will start PPI bolus and drip and transfuse.  Less likely gastric ulcers, diverticulitis.  Consulted with medicine who agrees to admit ? ?  ? ? ?FINAL CLINICAL IMPRESSION(S) / ED DIAGNOSES  ? ?Final diagnoses:  ?Gastrointestinal hemorrhage with melena  ?Symptomatic anemia  ? ? ? ?Rx / DC Orders  ? ?ED Discharge Orders   ? ? None  ? ?  ? ? ? ?Note:  This document was prepared using Dragon voice recognition software and may include unintentional dictation errors. ?  ?Vladimir Crofts, MD ?04/18/22 0403 ? ?

## 2022-04-18 NOTE — Assessment & Plan Note (Signed)
-   The patient completed 6 cycles of carboplatin and Taxol in February 2023 and XRT on 02/09/2022 and is expected to have GI surgery at [facility] later this month. ?

## 2022-04-18 NOTE — Assessment & Plan Note (Addendum)
-   He was typed and crossmatch and will be transfused units of packed red blood cells as mentioned above. ?- This is likely the culprit for his presyncope. ?

## 2022-04-18 NOTE — Assessment & Plan Note (Signed)
-   The patient will be admitted to a medical telemetry bed. ?- He was typed and crossmatch and will be transfused 2 units of packed red blood cells. ?- We will follow posttransfusion H&H. ?- Serial H&H will be obtained. ?- We will continue him on IV Protonix drip. ?- GI consultation will be obtained. ?- I notified Dr. Marius Ditch about the patient. ?

## 2022-04-18 NOTE — Assessment & Plan Note (Signed)
-   Continue PPI therapy as well as Carafate and olanzapine. ?

## 2022-04-18 NOTE — H&P (Addendum)
?  ?  ?[place] ? ? ?PATIENT NAME: Samuel Pennington   ? ?MR#:  [id number] ? ?DATE OF BIRTH:  1966/09/23 ? ?DATE OF ADMISSION:  04/18/2022 ? ?PRIMARY CARE PHYSICIAN: Revelo, Elyse Jarvis, MD  ? ?Patient is coming from: Home ? ?REQUESTING/REFERRING PHYSICIAN: Vladimir Crofts, MD ? ?CHIEF COMPLAINT:  ? ?Chief Complaint  ?Patient presents with  ? Abdominal Pain  ? ? ?HISTORY OF PRESENT ILLNESS:  ?Samuel Pennington is a 56 y.o. Hispanic male with medical history significant for esophageal adenocarcinoma, type 2 diabetes mellitus, dyslipidemia who is expected to have surgical intervention for his cancer in June at [facility].  He presented to the emergency room with acute onset of melena she has been intermittent over the last week with associated episodes of generalized weakness and presyncope with standing.  No falls or loss of consciousness or head injuries.  No chest pain or dyspnea or palpitations.  No fever or chills.  No nausea or vomiting or abdominal pain.  No dysuria, oliguria or hematuria or flank pain. ? ?ED Course: When he came to the ER BP was 134/54 with heart rate of 101 with otherwise normal vital signs.  Labs revealed unremarkable CMP except for total protein of 6.2 and CBC showed anemia with hemoglobin of 5.9 hematocrit 18 compared to 10.7 and 33 on 03/15/2022.  UA was unremarkable. ?EKG as reviewed by me : EKG showed sinus tachycardia with rate of 102 with right axis deviation. ? ?The patient was given IV Protonix bolus and drip.  He will be admitted to a progressive unit bed for further evaluation and management. ?PAST MEDICAL HISTORY:  ? ?Past Medical History:  ?Diagnosis Date  ? Cancer [facility])   ? Diabetes mellitus without complication ([place])   ? Hyperlipidemia   ? Neuromuscular disorder ([place])   ? ? ?PAST SURGICAL HISTORY:  ? ?Past Surgical History:  ?Procedure Laterality Date  ? COLONOSCOPY WITH PROPOFOL N/A 11/14/2021  ? Procedure: COLONOSCOPY WITH PROPOFOL;  Surgeon: Lesly Rubenstein,  MD;  Location[facility] ENDOSCOPY;  Service: Endoscopy;  Laterality: N/A;  ? ESOPHAGOGASTRODUODENOSCOPY (EGD) WITH PROPOFOL N/A 11/14/2021  ? Procedure: ESOPHAGOGASTRODUODENOSCOPY (EGD) WITH PROPOFOL;  Surgeon: Lesly Rubenstein, MD;  Location: [facility] ENDOSCOPY;  Service: Endoscopy;  Laterality: N/A;  ? EUS N/A 12/14/2021  ? Procedure: FULL UPPER ENDOSCOPIC ULTRASOUND (EUS) RADIAL;  Surgeon: Holly Bodily, MD;  Location: [facility] ENDOSCOPY;  Service: Gastroenterology;  Laterality: N/A;  SPANISH INTERPRETER  ? ? ?SOCIAL HISTORY:  ? ?Social History  ? ?Tobacco Use  ? Smoking status: Never  ? Smokeless tobacco: Never  ?Substance Use Topics  ? Alcohol use: Yes  ?  Comment: occassionallh  ? ? ?FAMILY HISTORY:  ? ?Family History  ?Problem Relation Age of Onset  ? Hypertension Mother   ? ? ?DRUG ALLERGIES:  ?No Known Allergies ? ?REVIEW OF SYSTEMS:  ? ?ROS ?As per history of present illness. All pertinent systems were reviewed above. Constitutional, HEENT, cardiovascular, respiratory, GI, GU, musculoskeletal, neuro, psychiatric, endocrine, integumentary and hematologic systems were reviewed and are otherwise negative/unremarkable except for positive findings mentioned above in the HPI. ? ? ?MEDICATIONS AT HOME:  ? ?Prior to Admission medications   ?Medication Sig Start Date End Date Taking? Authorizing Provider  ?ALPRAZolam (XANAX) 0.25 MG tablet Take 1 tablet (0.25 mg total) by mouth at bedtime as needed for anxiety. 03/22/22  Yes Lloyd Huger, MD  ?glipiZIDE (GLUCOTROL XL) 5 MG 24 hr tablet TOME 1 TABLETA POR BOCA DIARIO  PARA LA DIABETES 12/13/20 04/18/22 Yes Revelo, Elyse Jarvis, MD  ?metFORMIN (GLUCOPHAGE) 1000 MG tablet Take 1,000 mg by mouth daily. 08/27/18  Yes Revelo, Elyse Jarvis, MD  ?OLANZapine (ZYPREXA) 5 MG tablet Take 1 tablet (5 mg total) by mouth at bedtime as needed (nausea). 04/10/22  Yes Borders, Kirt Boys, NP  ?oxyCODONE (OXY IR/ROXICODONE) 5 MG immediate release tablet Take 10 mg by mouth every 6  (six) hours as needed for pain. 11/06/21  Yes [provider]  ?pantoprazole (PROTONIX) 40 MG tablet Take 1 tablet (40 mg total) by mouth daily. 03/15/22  Yes Borders, Kirt Boys, NP  ?atorvastatin (LIPITOR) 40 MG tablet TOMA UNA TABLETA POR BOCA AL ACOSTARSE PARA COLESTEROL ALTO 07/06/20 03/21/22  Revelo, Elyse Jarvis, MD  ?cyclobenzaprine (FLEXERIL) 10 MG tablet Take 1 tablet (10 mg total) by mouth 3 (three) times daily as needed. ?Patient not taking: Reported on 03/21/2022 02/09/22   Versie Starks, PA-C  ?lidocaine-prilocaine (EMLA) cream Apply to affected area once ?Patient not taking: Reported on 03/21/2022 12/22/21   Lloyd Huger, MD  ?ondansetron (ZOFRAN) 8 MG tablet Take 1 tablet (8 mg total) by mouth every 6 (six) hours as needed for nausea or vomiting. ?Patient not taking: Reported on 03/21/2022 03/14/22   Lloyd Huger, MD  ?sucralfate (CARAFATE) 1 g tablet Take 1 tablet (1 g total) by mouth 4 (four) times daily -  with meals and at bedtime. ?Patient not taking: Reported on 04/18/2022 02/26/22   Hughie Closs, PA-C  ? ?  ? ?VITAL SIGNS:  ?Blood pressure (!) 87/57, pulse 75, temperature 98.3 ?F (36.8 ?C), temperature source Oral, resp. rate 14, height '5\' 2"'$  (1.575 m), weight 65.8 kg, SpO2 100 %. ? ?PHYSICAL EXAMINATION:  ?Physical Exam ? ?GENERAL:  56 y.o.-year-old Hispanic male patient lying in the bed with no acute distress.  ?EYES: Pupils equal, round, reactive to light and accommodation. No scleral icterus.  Positive pallor.  Extraocular muscles intact.  ?HEENT: Head atraumatic, normocephalic. Oropharynx and nasopharynx clear.  ?NECK:  Supple, no jugular venous distention. No thyroid enlargement, no tenderness.  ?LUNGS: Normal breath sounds bilaterally, no wheezing, rales,rhonchi or crepitation. No use of accessory muscles of respiration.  ?CARDIOVASCULAR: Regular rate and rhythm, S1, S2 normal. No murmurs, rubs, or gallops.  ?ABDOMEN: Soft, nondistended, nontender. Bowel sounds  present. No organomegaly or mass.  ?EXTREMITIES: No pedal edema, cyanosis, or clubbing.  ?NEUROLOGIC: Cranial nerves II through XII are intact. Muscle strength 5/5 in all extremities. Sensation intact. Gait not checked.  ?PSYCHIATRIC: The patient is alert and oriented x 3.  Normal affect and good eye contact. ?SKIN: No obvious rash, lesion, or ulcer.  ? ?LABORATORY PANEL:  ? ?CBC ?Recent Labs  ?Lab 04/18/22 ?0001  ?WBC 4.5  ?HGB 5.9*  ?HCT 18.0*  ?PLT 177  ? ?------------------------------------------------------------------------------------------------------------------ ? ?Chemistries  ?Recent Labs  ?Lab 04/18/22 ?0001  ?NA 139  ?K 4.1  ?CL 109  ?CO2 25  ?GLUCOSE 145*  ?BUN 16  ?CREATININE 0.68  ?CALCIUM 8.5*  ?AST 21  ?ALT 16  ?ALKPHOS 58  ?BILITOT 0.4  ? ?------------------------------------------------------------------------------------------------------------------ ? ?Cardiac Enzymes ?No results for input(s): TROPONINI in the last 168 hours. ?------------------------------------------------------------------------------------------------------------------ ? ?RADIOLOGY:  ?No results found. ? ? ? ?IMPRESSION AND PLAN:  ?Assessment and Plan: ?* GI bleeding ?- The patient will be admitted to a medical telemetry bed. ?- He was typed and crossmatch and will be transfused 2 units of packed red blood cells. ?- We will follow posttransfusion H&H. ?-  Serial H&H will be obtained. ?- We will continue him on IV Protonix drip. ?- GI consultation will be obtained. ?- I notified Dr. Marius Ditch about the patient. ? ?Esophageal adenocarcinoma ([place]) ?- The patient completed 6 cycles of carboplatin and Taxol in February 2023 and XRT on 02/09/2022 and is expected to have GI surgery at [facility] later this month. ? ?Acute blood loss anemia ?- He was typed and crossmatch and will be transfused units of packed red blood cells as mentioned above. ? ?DM2 (diabetes mellitus, type 2) ([place]) ?- The patient will be placed on supplement  coverage with NovoLog. ?- We will continue Glucotrol XL with p.o. intake. ?- We will hold off metformin. ? ?GERD without esophagitis ?- Continue PPI therapy as well as Carafate and olanzapine. ? ? ? ?DVT prophyl

## 2022-04-18 NOTE — Assessment & Plan Note (Signed)
-   The patient will be placed on supplement coverage with NovoLog. ?- We will continue Glucotrol XL with p.o. intake. ?- We will hold off metformin. ?

## 2022-04-18 NOTE — ED Notes (Signed)
Noted pt's lab results...acuity level changed and will take pt to next available exam room ?

## 2022-04-19 ENCOUNTER — Inpatient Hospital Stay: Payer: Self-pay | Admitting: Anesthesiology

## 2022-04-19 ENCOUNTER — Encounter: Payer: Self-pay | Admitting: Family Medicine

## 2022-04-19 ENCOUNTER — Encounter
Admission: EM | Disposition: A | Discharge status: Home / Self Care | Payer: Self-pay | Source: Home / Self Care | Attending: Internal Medicine

## 2022-04-19 HISTORY — PX: ESOPHAGOGASTRODUODENOSCOPY (EGD) WITH PROPOFOL: SHX5813

## 2022-04-19 LAB — BASIC METABOLIC PANEL
Anion gap: 1 — ABNORMAL LOW (ref 5–15)
BUN: 10 mg/dL (ref 6–20)
CO2: 25 mmol/L (ref 22–32)
Calcium: 8.6 mg/dL — ABNORMAL LOW (ref 8.9–10.3)
Chloride: 114 mmol/L — ABNORMAL HIGH (ref 98–111)
Creatinine, Ser: 0.78 mg/dL (ref 0.61–1.24)
GFR, Estimated: >60 mL/min (ref >60)
Glucose, Bld: 112 mg/dL — ABNORMAL HIGH (ref 70–99)
Potassium: 3.7 mmol/L (ref 3.5–5.1)
Sodium: 140 mmol/L (ref 135–145)

## 2022-04-19 LAB — CBC
HCT: 23.2 % — ABNORMAL LOW (ref 39.0–52.0)
Hemoglobin: 7.5 g/dL — ABNORMAL LOW (ref 13.0–17.0)
MCH: 29.8 pg (ref 26.0–34.0)
MCHC: 32.3 g/dL (ref 30.0–36.0)
MCV: 92.1 fL (ref 80.0–100.0)
Platelets: 151 10*3/uL (ref 150–400)
RBC: 2.52 MIL/uL — ABNORMAL LOW (ref 4.22–5.81)
RDW: 19.2 % — ABNORMAL HIGH (ref 11.5–15.5)
WBC: 2.8 10*3/uL — ABNORMAL LOW (ref 4.0–10.5)
nRBC: 0 % (ref 0.0–0.2)

## 2022-04-19 LAB — GLUCOSE, CAPILLARY
Glucose-Capillary: 85 mg/dL (ref 70–99)
Glucose-Capillary: 113 mg/dL — ABNORMAL HIGH (ref 70–99)
Glucose-Capillary: 101 mg/dL — ABNORMAL HIGH (ref 70–99)
Glucose-Capillary: 93 mg/dL (ref 70–99)
Glucose-Capillary: 75 mg/dL (ref 70–99)

## 2022-04-19 LAB — MAGNESIUM: Magnesium: 2 mg/dL (ref 1.7–2.4)

## 2022-04-19 SURGERY — ESOPHAGOGASTRODUODENOSCOPY (EGD) WITH PROPOFOL
Anesthesia: General

## 2022-04-19 MED ORDER — EPINEPHRINE PF 1 MG/ML IJ SOLN
INTRAMUSCULAR | Status: DC | PRN
  Administered 2022-04-19: 3 mL via SUBCUTANEOUS

## 2022-04-19 MED ORDER — LIDOCAINE HCL (CARDIAC) PF 100 MG/5ML IV SOSY
PREFILLED_SYRINGE | INTRAVENOUS | Status: DC | PRN
  Administered 2022-04-19: 100 mg via INTRAVENOUS

## 2022-04-19 MED ORDER — EPINEPHRINE 1 MG/10ML IJ SOSY
PREFILLED_SYRINGE | INTRAMUSCULAR | Status: AC
  Filled 2022-04-19: qty 10

## 2022-04-19 MED ORDER — PROPOFOL 10 MG/ML IV BOLUS
INTRAVENOUS | Status: DC | PRN
  Administered 2022-04-19: 30 mg via INTRAVENOUS
  Administered 2022-04-19: 20 mg via INTRAVENOUS
  Administered 2022-04-19: 30 mg via INTRAVENOUS
  Administered 2022-04-19: 50 mg via INTRAVENOUS
  Administered 2022-04-19: 70 mg via INTRAVENOUS

## 2022-04-19 MED ORDER — SODIUM CHLORIDE 0.9 % IV SOLN
300.0000 mg | Freq: Once | INTRAVENOUS | Status: AC
  Administered 2022-04-19: 300 mg via INTRAVENOUS
  Filled 2022-04-19: qty 300

## 2022-04-19 MED ORDER — SODIUM CHLORIDE 0.9 % IV SOLN: INTRAVENOUS | Status: DC

## 2022-04-19 MED ORDER — STERILE WATER FOR IRRIGATION IR SOLN
Status: DC | PRN
  Administered 2022-04-19: 60 mL

## 2022-04-19 NOTE — Progress Notes (Signed)
?PROGRESS NOTE ? ? ? ?Samuel Pennington  HYI:[id number] DOB: 06-09-1966 DOA: 04/18/2022 ?PCP: Theotis Burrow, MD  ? ?Brief Narrative:  ?56 year old with history of esophageal adenocarcinoma, DM2, HLD who is expected to have surgical intervention for his cancer in June at [facility] admitted for acute onset melena with generalized weakness and presyncope.  Upon admission he was slightly tachycardic, blood loss anemia of hemoglobin 5.9, baseline 10.7.  Patient was started on IV PPI drip and GI team was consulted. ? ? ?Assessment & Plan: ? Principal Problem: ?  GI bleeding ?Active Problems: ?  Acute blood loss anemia ?  Squamous cell esophageal cancer (HCC) ?  DM2 (diabetes mellitus, type 2) ([place]) ?  GERD without esophagitis ?  ? ? ?Assessment and Plan: ?* GI bleeding, gastric ulcer ?Acute blood loss anemia, admission hemoglobin 5.9 ?Severe iron deficiency ?- EGD today showed large gastric ulcer, recommending n.p.o. and PPI at least for next 72 hours ?- Status post units PRBC transfusion, may require Pennington transfusion ?- IV iron.  Transition to p.o. when able ? ?Presyncope ?- Suspect related to blood loss and anemia ? 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 ? ?DM2 (diabetes mellitus, type 2) ([place]) ?-Currently on sliding scale and Accu-Chek.  Glipizide 5 mg daily.  Metformin on hold. ? ?GERD without esophagitis ?- Continue PPI therapy as well as Carafate and olanzapine. ? ? ? ?DVT prophylaxis: Place and maintain sequential compression device Start: 04/18/22 0817SCDs ?Code Status: Full ?Family Communication:   ? ?Status is: Inpatient ?Remains inpatient appropriate because: Endoscopy showing large bleeding gastric ulcer which was treated.  Maintain n.p.o., IV PPI drip. ? ?Subjective: ?No bleeding this morning per  patient, no acute events overnight ? ?Examination: ? ?Constitutional: Not in acute distress ?Respiratory: Clear to auscultation bilaterally ?Cardiovascular: Normal sinus rhythm, no rubs ?Abdomen: Nontender nondistended good bowel sounds ?Musculoskeletal: No edema noted ?Skin: No rashes seen ?Neurologic: CN 2-12 grossly intact.  And nonfocal ?Psychiatric: Normal judgment and insight. Alert and oriented x 3. Normal mood. ? ? ?Objective: ?Vitals:  ? 04/18/22 2245 04/19/22 0013 04/19/22 0417 04/19/22 0733  ?BP: (!) 102/58 (!) 101/53 (!) 100/55 (!) 103/59  ?Pulse:  67  73  ?Resp:  '18 20 15  '$ ?Temp:  98 ?F (36.7 ?C) 98.8 ?F (37.1 ?C) 98.3 ?F (36.8 ?C)  ?TempSrc:  Oral Oral   ?SpO2: 100% 97% 98% 90%  ?Weight:      ?Height:      ? ? ?Intake/Output Summary (Last 24 hours) at 04/19/2022 0831 ?Last data filed at 04/19/2022 7867 ?Gross per 24 hour  ?Intake 2367.3 ml  ?Output 1900 ml  ?Net 467.3 ml  ? ?Filed Weights  ? 04/17/22 2357 04/18/22 2238  ?Weight: 65.8 kg 68.9 kg  ? ? ? ?Data Reviewed:  ? ?CBC: ?Recent Labs  ?Lab 04/18/22 ?0001 04/18/22 ?0856 04/18/22 ?1426 04/18/22 ?2001 04/19/22 ?6720  ?WBC 4.5  --   --  3.0* 2.8*  ?NEUTROABS 3.0  --   --   --   --   ?HGB 5.9* 7.2* 7.1* 7.2* 7.5*  ?HCT 18.0* 22.6* 22.1* 22.5* 23.2*  ?MCV 98.4  --   --  92.2 92.1  ?PLT 177  --   --  141* 151  ? ?Basic Metabolic Panel: ?Recent Labs  ?Lab 04/18/22 ?0001 04/19/22 ?  2440  ?NA 139 140  ?K 4.1 3.7  ?CL 109 114*  ?CO2 25 25  ?GLUCOSE 145* 112*  ?BUN 16 10  ?CREATININE 0.68 0.78  ?CALCIUM 8.5* 8.6*  ?MG  --  2.0  ? ?GFR: ?Estimated Creatinine Clearance: 85.3 mL/min (by C-G formula based on SCr of 0.78 mg/dL). ?Liver Function Tests: ?Recent Labs  ?Lab 04/18/22 ?0001  ?AST 21  ?ALT 16  ?ALKPHOS 58  ?BILITOT 0.4  ?PROT 6.2*  ?ALBUMIN 3.5  ? ?Recent Labs  ?Lab 04/18/22 ?0001  ?LIPASE 30  ? ?No results for input(s): AMMONIA in the last 168 hours. ?Coagulation Profile: ?No results for input(s): INR, PROTIME in the last 168 hours. ?Cardiac Enzymes: ?No  results for input(s): CKTOTAL, CKMB, CKMBINDEX, TROPONINI in the last 168 hours. ?BNP (last 3 results) ?No results for input(s): PROBNP in the last 8760 hours. ?HbA1C: ?No results for input(s): HGBA1C in the last 72 hours. ?CBG: ?Recent Labs  ?Lab 04/18/22 ?1729 04/18/22 ?1947 04/18/22 ?2211 04/19/22 ?1027 04/19/22 ?0734  ?GLUCAP 104* 134* 76 85 113*  ? ?Lipid Profile: ?No results for input(s): CHOL, HDL, LDLCALC, TRIG, CHOLHDL, LDLDIRECT in the last 72 hours. ?Thyroid Function Tests: ?No results for input(s): TSH, T4TOTAL, FREET4, T3FREE, THYROIDAB in the last 72 hours. ?Anemia Panel: ?Recent Labs  ?  04/18/22 ?2536  ?VITAMINB12 496  ?FERRITIN 6*  ?TIBC 333  ?IRON 28*  ? ?Sepsis Labs: ?No results for input(s): PROCALCITON, LATICACIDVEN in the last 168 hours. ? ?No results found for this or any previous visit (from the past 240 hour(s)).  ? ? ? ? ? ?Radiology Studies: ?No results found. ? ? ? ? ? ?Scheduled Meds: ? atorvastatin  40 mg Oral Daily  ? insulin aspart  0-9 Units Subcutaneous TID AC & HS  ? [START ON 04/21/2022] pantoprazole  40 mg Intravenous Q12H  ? ?Continuous Infusions: ? sodium chloride 100 mL/hr at 04/18/22 2307  ? pantoprazole 8 mg/hr (04/19/22 0130)  ? ? ? LOS: 1 day  ? ?Time spent= 35 mins ? ? ? ?Antonyo Hinderer Arsenio Loader, MD ?[facility] ? ?If 7PM-7AM, please contact night-coverage ? ?04/19/2022, 8:31 AM  ? ?

## 2022-04-19 NOTE — Plan of Care (Signed)
?  Problem: Education: Goal: Knowledge of General Education information will improve Description: Including pain rating scale, medication(s)/side effects and non-pharmacologic comfort measures Outcome: Progressing   Problem: Clinical Measurements: Goal: Respiratory complications will improve Outcome: Progressing   Problem: Activity: Goal: Risk for activity intolerance will decrease Outcome: Progressing   Problem: Pain Managment: Goal: General experience of comfort will improve Outcome: Progressing   Problem: Safety: Goal: Ability to remain free from injury will improve Outcome: Progressing   

## 2022-04-19 NOTE — Anesthesia Preprocedure Evaluation (Signed)
Anesthesia Evaluation  ?Patient identified by MRN, date of birth, ID band ?Patient awake ? ? ? ?Reviewed: ?Allergy & Precautions, NPO status , Patient's Chart, lab work & pertinent test results ? ?History of Anesthesia Complications ?Negative for: history of anesthetic complications ? ?Airway ?Mallampati: III ? ?TM Distance: <3 FB ?Neck ROM: full ? ? ? Dental ? ?(+) Chipped, Poor Dentition, Missing ?  ?Pulmonary ?neg pulmonary ROS, neg shortness of breath,  ?  ?Pulmonary exam normal ? ? ? ? ? ? ? Cardiovascular ?(-) angina(-) Past MI negative cardio ROS ?Normal cardiovascular exam ? ? ?  ?Neuro/Psych ? Neuromuscular disease negative psych ROS  ? GI/Hepatic ?Neg liver ROS, GERD  Controlled,  ?Endo/Other  ?diabetes ? Renal/GU ?negative Renal ROS  ?negative genitourinary ?  ?Musculoskeletal ? ? Abdominal ?  ?Peds ? Hematology ?negative hematology ROS ?(+)   ?Anesthesia Other Findings ?Patient is NPO appropriate and reports no nausea or vomiting today. ? ?Past Medical History: ?No date: Cancer [facility]) ?No date: Diabetes mellitus without complication ([place]) ?No date: Hyperlipidemia ?No date: Neuromuscular disorder ([place]) ? ?Past Surgical History: ?11/14/2021: COLONOSCOPY WITH PROPOFOL; N/A ?    Comment:  Procedure: COLONOSCOPY WITH PROPOFOL;  Surgeon:  ?             Lesly Rubenstein, MD;  Location: [facility] ENDOSCOPY;   ?             Service: Endoscopy;  Laterality: N/A; ?11/14/2021: ESOPHAGOGASTRODUODENOSCOPY (EGD) WITH PROPOFOL; N/A ?    Comment:  Procedure: ESOPHAGOGASTRODUODENOSCOPY (EGD) WITH  ?             PROPOFOL;  Surgeon: Lesly Rubenstein, MD;  Location:  ?             [facility] ENDOSCOPY;  Service: Endoscopy;  Laterality: N/A; ?12/14/2021: EUS; N/A ?    Comment:  Procedure: FULL UPPER ENDOSCOPIC ULTRASOUND (EUS)  ?             RADIAL;  Surgeon: Burbridge, Murray Hodgkins, MD;  Location:  ?             [facility] ENDOSCOPY;  Service: Gastroenterology;  Laterality:  ?             N/A;  SPANISH  INTERPRETER ? ?BMI   ? Body Mass Index: 26.09 kg/m?  ?  ? ? Reproductive/Obstetrics ?negative OB ROS ? ?  ? ? ? ? ? ? ? ? ? ? ? ? ? ?  ?  ? ? ? ? ? ? ? ? ?Anesthesia Physical ?Anesthesia Plan ? ?ASA: 3 ? ?Anesthesia Plan: General  ? ?Post-op Pain Management:   ? ?Induction: Intravenous ? ?PONV Risk Score and Plan: Ondansetron, Dexamethasone, Midazolam and Treatment may vary due to age or medical condition ? ?Airway Management Planned: Natural Airway and Nasal Cannula ? ?Additional Equipment:  ? ?Intra-op Plan:  ? ?Post-operative Plan:  ? ?Informed Consent: I have reviewed the patients History and Physical, chart, labs and discussed the procedure including the risks, benefits and alternatives for the proposed anesthesia with the patient or authorized representative who has indicated his/her understanding and acceptance.  ? ? ? ?Dental Advisory Given and Interpreter used for interveiw ? ?Plan Discussed with: Anesthesiologist, CRNA and Surgeon ? ?Anesthesia Plan Comments: (Patient consented for risks of anesthesia including but not limited to:  ?- adverse reactions to medications ?- risk of airway placement if required ?- damage to eyes, teeth, lips or other oral mucosa ?- nerve damage due to positioning  ?-  sore throat or hoarseness ?- Damage to heart, brain, nerves, lungs, other parts of body or loss of life ? ?Patient voiced understanding.)  ? ? ? ? ? ? ?Anesthesia Quick Evaluation ? ?

## 2022-04-19 NOTE — H&P (Signed)
? ? ? ?Samuel Bellows, MD ?[address] [place], [place], [place], [address] ?[address] [place], [place], [place], [address] ?Phone: [phone]  ?Fax: [phone] ? ?Primary Care Physician:  Samuel Burrow, MD ? ? ?Pre-Procedure History & Physical: ?HPI:  Samuel Pennington is a 56 y.o. male is here for an endoscopy  ?  ?Past Medical History:  ?Diagnosis Date  ? Cancer [place])   ? Diabetes mellitus without complication ([place])   ? Hyperlipidemia   ? Neuromuscular disorder ([place])   ? ? ?Past Surgical History:  ?Procedure Laterality Date  ? COLONOSCOPY WITH PROPOFOL N/A 11/14/2021  ? Procedure: COLONOSCOPY WITH PROPOFOL;  Surgeon: Samuel Rubenstein, MD;  Location: [facility] ENDOSCOPY;  Service: Endoscopy;  Laterality: N/A;  ? ESOPHAGOGASTRODUODENOSCOPY (EGD) WITH PROPOFOL N/A 11/14/2021  ? Procedure: ESOPHAGOGASTRODUODENOSCOPY (EGD) WITH PROPOFOL;  Surgeon: Samuel Rubenstein, MD;  Location: [facility] ENDOSCOPY;  Service: Endoscopy;  Laterality: N/A;  ? EUS N/A 12/14/2021  ? Procedure: FULL UPPER ENDOSCOPIC ULTRASOUND (EUS) RADIAL;  Surgeon: Samuel Bodily, MD;  Location: [facility] ENDOSCOPY;  Service: Gastroenterology;  Laterality: N/A;  SPANISH INTERPRETER  ? ? ?Prior to Admission medications   ?Medication Sig Start Date End Date Taking? Authorizing Provider  ?ALPRAZolam (XANAX) 0.25 MG tablet Take 1 tablet (0.25 mg total) by mouth at bedtime as needed for anxiety. 03/22/22  Yes Samuel Huger, MD  ?glipiZIDE (GLUCOTROL XL) 5 MG 24 hr tablet TOME 1 TABLETA POR BOCA DIARIO PARA LA DIABETES 12/13/20 04/18/22 Yes Revelo, Elyse Jarvis, MD  ?metFORMIN (GLUCOPHAGE) 1000 MG tablet Take 1,000 mg by mouth daily. 08/27/18  Yes Revelo, Elyse Jarvis, MD  ?OLANZapine (ZYPREXA) 5 MG tablet Take 1 tablet (5 mg total) by mouth at bedtime as needed (nausea). 04/10/22  Yes Borders, Kirt Boys, NP  ?oxyCODONE (OXY IR/ROXICODONE) 5 MG immediate release tablet Take 10 mg by mouth every 6 (six) hours as needed for pain. 11/06/21   Yes [provider]  ?pantoprazole (PROTONIX) 40 MG tablet Take 1 tablet (40 mg total) by mouth daily. 03/15/22  Yes Borders, Kirt Boys, NP  ?atorvastatin (LIPITOR) 40 MG tablet TOMA UNA TABLETA POR BOCA AL ACOSTARSE PARA COLESTEROL ALTO 07/06/20 03/21/22  Revelo, Elyse Jarvis, MD  ?cyclobenzaprine (FLEXERIL) 10 MG tablet Take 1 tablet (10 mg total) by mouth 3 (three) times daily as needed. ?Patient not taking: Reported on 03/21/2022 02/09/22   Samuel Starks, PA-C  ?lidocaine-prilocaine (EMLA) cream Apply to affected area once ?Patient not taking: Reported on 03/21/2022 12/22/21   Samuel Huger, MD  ?ondansetron (ZOFRAN) 8 MG tablet Take 1 tablet (8 mg total) by mouth every 6 (six) hours as needed for nausea or vomiting. ?Patient not taking: Reported on 03/21/2022 03/14/22   Samuel Huger, MD  ?sucralfate (CARAFATE) 1 g tablet Take 1 tablet (1 g total) by mouth 4 (four) times daily -  with meals and at bedtime. ?Patient not taking: Reported on 04/18/2022 02/26/22   Samuel Closs, PA-C  ? ? ?Allergies as of 04/17/2022  ? (No Known Allergies)  ? ? ?Family History  ?Problem Relation Age of Onset  ? Hypertension Mother   ? ? ?Social History  ? ?Socioeconomic History  ? Marital status: Married  ?  Spouse name: Not on file  ? Number of children: Not on file  ? Years of education: Not on file  ? Highest education level: Not on file  ?Occupational History  ? Not on file  ?Tobacco Use  ? Smoking  status: Never  ? Smokeless tobacco: Never  ?Vaping Use  ? Vaping Use: Never used  ?Substance and Sexual Activity  ? Alcohol use: Yes  ?  Comment: occassionallh  ? Drug use: Never  ? Sexual activity: Not on file  ?Other Topics Concern  ? Not on file  ?Social History Narrative  ? Not on file  ? ?Social Determinants of Health  ? ?Financial Resource Strain: Not on file  ?Food Insecurity: Not on file  ?Transportation Needs: Not on file  ?Physical Activity: Not on file  ?Stress: Not on file  ?Social Connections: Not on file   ?Intimate Partner Violence: Not on file  ? ? ?Review of Systems: ?See HPI, otherwise negative ROS ? ?Physical Exam: ?BP (!) 102/57   Pulse 62   Temp 98.3 ?F (36.8 ?C)   Resp 16   Ht '5\' 4"'$  (1.626 m)   Wt 68.9 kg   SpO2 98%   BMI 26.07 kg/m?  ?General:   Alert,  pleasant and cooperative in NAD ?Head:  Normocephalic and atraumatic. ?Neck:  Supple; no masses or thyromegaly. ?Lungs:  Clear throughout to auscultation, normal respiratory effort.    ?Heart:  +S1, +S2, Regular rate and rhythm, No edema. ?Abdomen:  Soft, nontender and nondistended. Normal bowel sounds, without guarding, and without rebound.   ?Neurologic:  Alert and  oriented x4;  grossly normal neurologically. ? ?Impression/Plan: ?Samuel Pennington is here for an endoscopy  to be performed for  evaluation of melena ?   ?Risks, benefits, limitations, and alternatives regarding endoscopy have been reviewed with the patient.  Questions have been answered.  All parties agreeable. ? ? ?Samuel Bellows, MD  04/19/2022, 11:40 AM ? ?

## 2022-04-19 NOTE — Anesthesia Postprocedure Evaluation (Signed)
Anesthesia Post Note ? ?Patient: Samuel Pennington ? ?Procedure(s) Performed: ESOPHAGOGASTRODUODENOSCOPY (EGD) WITH PROPOFOL ? ?Patient location during evaluation: Endoscopy ?Anesthesia Type: General ?Level of consciousness: awake and alert ?Pain management: pain level controlled ?Vital Signs Assessment: post-procedure vital signs reviewed and stable ?Respiratory status: spontaneous breathing, nonlabored ventilation, respiratory function stable and patient connected to nasal cannula oxygen ?Cardiovascular status: blood pressure returned to baseline and stable ?Postop Assessment: no apparent nausea or vomiting ?Anesthetic complications: no ? ? ?No notable events documented. ? ? ?Last Vitals:  ?Vitals:  ? 04/19/22 1220 04/19/22 1230  ?BP: 116/65 133/73  ?Pulse: 61 (!) 57  ?Resp: 19 16  ?Temp:    ?SpO2: 100% 100%  ?  ?Last Pain:  ?Vitals:  ? 04/19/22 1254  ?TempSrc:   ?PainSc: 7   ? ? ?  ?  ?  ?  ?  ?  ? ?Precious Haws Lore Polka ? ? ? ? ?

## 2022-04-19 NOTE — Progress Notes (Signed)
Pt refused portable spanish interpreter at this time. Will continue to monitor. ?

## 2022-04-19 NOTE — Progress Notes (Addendum)
Pt still NPO and blood sugar at 75. NP Jordan Likes ware. Will cotnineu to monitor. ? ?Update 2215: NP placed order. Will continue to monitor. ?

## 2022-04-19 NOTE — Transfer of Care (Signed)
Immediate Anesthesia Transfer of Care Note ? ?Patient: Samuel Pennington ? ?Procedure(s) Performed: ESOPHAGOGASTRODUODENOSCOPY (EGD) WITH PROPOFOL ? ?Patient Location: Endoscopy Unit ? ?Anesthesia Type:General ? ?Level of Consciousness: awake ? ?Airway & Oxygen Therapy: Patient Spontanous Breathing ? ?Post-op Assessment: Report given to RN and Post -op Vital signs reviewed and stable ? ?Post vital signs: Reviewed and stable ? ?Last Vitals:  ?Vitals Value Taken Time  ?BP    ?Temp    ?Pulse    ?Resp    ?SpO2    ? ? ?Last Pain:  ?Vitals:  ? 04/19/22 0622  ?TempSrc:   ?PainSc: Asleep  ?   ? ?Patients Stated Pain Goal: 0 (04/19/22 0500) ? ?Complications: No notable events documented. ?

## 2022-04-19 NOTE — Op Note (Signed)
[facility] ?Gastroenterology ?Patient Name: Samuel Pennington ?Procedure Date: 04/19/2022 11:44 AM ?MRN: [id number] ?Account #: [id number] ?Date of Birth: Apr 20, 1966 ?Admit Type: Inpatient ?Age: 56 ?Room: [facility] ENDO ROOM 4 ?Gender: Male ?Note Status: Finalized ?Instrument Name: Upper Endoscope [id number] ?Procedure:             Upper GI endoscopy ?Indications:           Melena ?Providers:             Jonathon Bellows MD, MD ?Referring MD:          Dyke Maes. Mancheno Revelo (Referring MD) ?Medicines:             Monitored Anesthesia Care ?Complications:         No immediate complications. ?Procedure:             Pre-Anesthesia Assessment: ?                       - Prior to the procedure, a History and Physical was  ?                       performed, and patient medications, allergies and  ?                       sensitivities were reviewed. The patient's tolerance  ?                       of previous anesthesia was reviewed. ?                       - The risks and benefits of the procedure and the  ?                       sedation options and risks were discussed with the  ?                       patient. All questions were answered and informed  ?                       consent was obtained. ?                       - ASA Grade Assessment: III - A patient with severe  ?                       systemic disease. ?                       After obtaining informed consent, the endoscope was  ?                       passed under direct vision. Throughout the procedure,  ?                       the patient's blood pressure, pulse, and oxygen  ?                       saturations were monitored continuously. The Endoscope  ?                       was  introduced through the mouth, and advanced to the  ?                       third part of duodenum. The upper GI endoscopy was  ?                       accomplished with ease. The patient tolerated the  ?                       procedure well. ?Findings: ?     The examined  duodenum was normal. ?     A medium-sized, ulcerating mass with no bleeding and no stigmata of  ?     recent bleeding was found in the middle third of the esophagus. The mass  ?     was non-obstructing and not circumferential. ?     One oozing superficial gastric ulcer with a clean ulcer base (Forrest  ?     Class III) was found in the gastric fundus. The lesion was 15 mm in  ?     largest dimension. Area was successfully injected with 4 mL of a  ?     1:10,000 solution of epinephrine for hemostasis. For location marking,  ?     one hemostatic clip was successfully placed. There waqs no bleeding from  ?     ulceer when i went into the stomach , on retroflexion i suspect the  ?     trauma caused some oozing from the mqargins . Very difficult location to  ?     see and there was diffuse oozing from margins and no visible vesel. The  ?     bleeding stopped with epi injection. ?     The examined duodenum was normal. ?Impression:            - Normal examined duodenum. ?                       - Malignant esophageal tumor was found in the middle  ?                       third of the esophagus. ?                       - Oozing gastric ulcer with a clean ulcer base  ?                       (Forrest Class III). Injected. Clip was placed. ?                       - Normal examined duodenum. ?                       - No specimens collected. ?Recommendation:        - Return patient to hospital ward for ongoing care. ?                       - NPO today. ?                       - IV PPI GTT ?  Monitor CBC and transfuse as needed- if has further  ?                       concrn for bleeding will need repeat EGD with  ?                       hemospray - if has a hemorrhage with significant bleed  ?                       then embolization via vascular surgery - clip placed  ?                       for location. IV PPI GGT for 72 hours then continue  ?                       for 12 weeks once a day prilosec 40  mg ?Procedure Code(s):     --- Professional --- ?                       64332, Esophagogastroduodenoscopy, flexible,  ?                       transoral; with control of bleeding, any method ?                       95188, Unlisted procedure, stomach ?Diagnosis Code(s):     --- Professional --- ?                       C15.4, Malignant neoplasm of middle third of esophagus ?                       K25.4, Chronic or unspecified gastric ulcer with  ?                       hemorrhage ?                       K92.1, Melena (includes Hematochezia) ?CPT copyright 2019 American Medical Association. All rights reserved. ?The codes documented in this report are preliminary and upon coder review may  ?be revised to meet current compliance requirements. ?Jonathon Bellows, MD ?Jonathon Bellows MD, MD ?04/19/2022 12:10:06 PM ?This report has been signed electronically. ?Number of Addenda: 0 ?Note Initiated On: 04/19/2022 11:44 AM ?Estimated Blood Loss:  Estimated blood loss: none. ?     [facility] ?

## 2022-04-20 ENCOUNTER — Encounter: Payer: Self-pay | Admitting: Gastroenterology

## 2022-04-20 LAB — CBC
HCT: 22.3 % — ABNORMAL LOW (ref 39.0–52.0)
HCT: 28.1 % — ABNORMAL LOW (ref 39.0–52.0)
Hemoglobin: 7.1 g/dL — ABNORMAL LOW (ref 13.0–17.0)
Hemoglobin: 9 g/dL — ABNORMAL LOW (ref 13.0–17.0)
MCH: 29.8 pg (ref 26.0–34.0)
MCH: 29 pg (ref 26.0–34.0)
MCHC: 31.8 g/dL (ref 30.0–36.0)
MCHC: 32 g/dL (ref 30.0–36.0)
MCV: 93.7 fL (ref 80.0–100.0)
MCV: 90.6 fL (ref 80.0–100.0)
Platelets: 147 10*3/uL — ABNORMAL LOW (ref 150–400)
Platelets: 166 10*3/uL (ref 150–400)
RBC: 2.38 MIL/uL — ABNORMAL LOW (ref 4.22–5.81)
RBC: 3.1 MIL/uL — ABNORMAL LOW (ref 4.22–5.81)
RDW: 18 % — ABNORMAL HIGH (ref 11.5–15.5)
RDW: 18.2 % — ABNORMAL HIGH (ref 11.5–15.5)
WBC: 3.5 10*3/uL — ABNORMAL LOW (ref 4.0–10.5)
WBC: 4.9 10*3/uL (ref 4.0–10.5)
nRBC: 0 % (ref 0.0–0.2)
nRBC: 0.4 % — ABNORMAL HIGH (ref 0.0–0.2)

## 2022-04-20 LAB — MAGNESIUM: Magnesium: 1.8 mg/dL (ref 1.7–2.4)

## 2022-04-20 LAB — BASIC METABOLIC PANEL
Anion gap: 5 (ref 5–15)
BUN: 8 mg/dL (ref 6–20)
CO2: 24 mmol/L (ref 22–32)
Calcium: 8.3 mg/dL — ABNORMAL LOW (ref 8.9–10.3)
Chloride: 112 mmol/L — ABNORMAL HIGH (ref 98–111)
Creatinine, Ser: 0.74 mg/dL (ref 0.61–1.24)
GFR, Estimated: >60 mL/min (ref >60)
Glucose, Bld: 111 mg/dL — ABNORMAL HIGH (ref 70–99)
Potassium: 3.2 mmol/L — ABNORMAL LOW (ref 3.5–5.1)
Sodium: 141 mmol/L (ref 135–145)

## 2022-04-20 LAB — GLUCOSE, CAPILLARY
Glucose-Capillary: 61 mg/dL — ABNORMAL LOW (ref 70–99)
Glucose-Capillary: 138 mg/dL — ABNORMAL HIGH (ref 70–99)
Glucose-Capillary: 111 mg/dL — ABNORMAL HIGH (ref 70–99)
Glucose-Capillary: 155 mg/dL — ABNORMAL HIGH (ref 70–99)
Glucose-Capillary: 135 mg/dL — ABNORMAL HIGH (ref 70–99)
Glucose-Capillary: 165 mg/dL — ABNORMAL HIGH (ref 70–99)

## 2022-04-20 LAB — PREPARE RBC (CROSSMATCH)

## 2022-04-20 MED ORDER — DEXTROSE 50 % IV SOLN
12.5000 g | INTRAVENOUS | Status: AC
  Administered 2022-04-20: 12.5 g via INTRAVENOUS
  Filled 2022-04-20: qty 50

## 2022-04-20 MED ORDER — POLYETHYLENE GLYCOL 3350 17 G PO PACK
17.0000 g | PACK | Freq: Two times a day (BID) | ORAL | Status: DC
  Administered 2022-04-20 – 2022-04-21 (×3): 17 g via ORAL
  Filled 2022-04-20 (×3): qty 1

## 2022-04-20 MED ORDER — SODIUM CHLORIDE 0.9% IV SOLUTION: Freq: Once | INTRAVENOUS | Status: AC

## 2022-04-20 MED ORDER — POTASSIUM CHLORIDE CRYS ER 20 MEQ PO TBCR
40.0000 meq | EXTENDED_RELEASE_TABLET | ORAL | Status: AC
  Administered 2022-04-20 (×2): 40 meq via ORAL
  Filled 2022-04-20 (×2): qty 2

## 2022-04-20 MED ORDER — DEXTROSE-NACL 5-0.9 % IV SOLN: INTRAVENOUS | Status: DC

## 2022-04-20 MED ORDER — POTASSIUM CHLORIDE 10 MEQ/100ML IV SOLN
10.0000 meq | INTRAVENOUS | Status: DC
  Administered 2022-04-20: 10 meq via INTRAVENOUS
  Filled 2022-04-20 (×4): qty 100

## 2022-04-20 MED ORDER — GLUCAGON HCL RDNA (DIAGNOSTIC) 1 MG IJ SOLR
1.0000 mg | Freq: Once | INTRAMUSCULAR | Status: DC | PRN
Start: 2022-04-20 — End: 2022-04-21

## 2022-04-20 NOTE — Progress Notes (Addendum)
Hypoglycemic Event ? ?CBG: 61 ? ?Treatment: D50 25 mL (12.5 gm) ? ?Symptoms: Hungry ? ?Follow-up CBG: Time:0335 CBG Result:138 ? ?Possible Reasons for Event: Inadequate meal intake ? ?Comments/MD notified:NP Randol Kern made aware at [phone]. Will continue to monitor. ? ?Update 0341: NP Randol Kern place order. Will continue to monitor. ? ? ? ?Kolbee Stallman M Katisha Shimizu ? ? ?

## 2022-04-20 NOTE — Progress Notes (Signed)
Cross Cover ?Patient NPO for procedire. Cbg 61.  ?1/2 amp d50 ?Change IV to D5NS ? ?

## 2022-04-20 NOTE — Progress Notes (Addendum)
?PROGRESS NOTE ? ? ? ?Samuel Pennington  YKD:[id number] DOB: Jul 28, 1966 DOA: 04/18/2022 ?PCP: Theotis Burrow, MD  ? ?Brief Narrative:  ?56 year old with history of esophageal adenocarcinoma, DM2, HLD who is expected to have surgical intervention for his cancer in June at [facility] admitted for acute onset melena with generalized weakness and presyncope.  Upon admission he was slightly tachycardic, blood loss anemia of hemoglobin 5.9, baseline 10.7.  Patient was started on IV PPI drip and GI team was consulted.  Endoscopy showed large oozing gastric ulcer requiring treatment.   ? ? ?Assessment & Plan: ? Principal Problem: ?  GI bleeding ?Active Problems: ?  Acute blood loss anemia ?  Squamous cell esophageal cancer (HCC) ?  DM2 (diabetes mellitus, type 2) ([place]) ?  GERD without esophagitis ?  ? ? ?Assessment and Plan: ?* GI bleeding, gastric ulcer ?Acute blood loss anemia, admission hemoglobin 5.9 ?Severe iron deficiency ?- EGD today showed large gastric ulcer, clear liquid diet, PPI drip for release another 48 hours.  Advance to soft diet later today if no further bleeding. ?- We will give additional unit of PRBC because of borderline 7.1 ?- IV iron.  Transition to p.o. when able ? ?Presyncope ?- Suspect related to blood loss and anemia ? 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 ? ?DM2 (diabetes mellitus, type 2) ([place]) ?-Currently on sliding scale and Accu-Chek.  Glipizide 5 mg daily.  Metformin on hold. ? ?GERD without esophagitis ?- Continue PPI therapy as well as Carafate and olanzapine. ? ? ? ?DVT prophylaxis: Place and maintain sequential compression device Start: 04/18/22 0817SCDs ?Code Status: Full ?Family Communication:   ? ?Status is: Inpatient ?Maintain hospital stay until cleared by gastroenterology  for discharge. ? ?Subjective: ?No bleeding overnight, patient is upset he has to stay in the hospital longer but he understands. ? ?Examination: ? ?Constitutional: Not in acute distress ?Respiratory: Clear to auscultation bilaterally ?Cardiovascular: Normal sinus rhythm, no rubs ?Abdomen: Nontender nondistended good bowel sounds ?Musculoskeletal: No edema noted ?Skin: No rashes seen ?Neurologic: CN 2-12 grossly intact.  And nonfocal ?Psychiatric: Normal judgment and insight. Alert and oriented x 3. Normal mood. ?Objective: ?Vitals:  ? 04/19/22 2349 04/20/22 0312 04/20/22 0816 04/20/22 1120  ?BP: (!) 104/59 (!) 102/54 (!) 99/52 105/61  ?Pulse:    64  ?Resp: '18 18 18 19  '$ ?Temp: 97.9 ?F (36.6 ?C) 98.4 ?F (36.9 ?C) 98.1 ?F (36.7 ?C) 98 ?F (36.7 ?C)  ?TempSrc: Oral Oral Oral Oral  ?SpO2: 98% 99% 96% 97%  ?Weight:      ?Height:      ? ? ?Intake/Output Summary (Last 24 hours) at 04/20/2022 1309 ?Last data filed at 04/20/2022 1018 ?Gross per 24 hour  ?Intake 2245.48 ml  ?Output 1075 ml  ?Net 1170.48 ml  ? ?Filed Weights  ? 04/17/22 2357 04/18/22 2238 04/19/22 1124  ?Weight: 65.8 kg 68.9 kg 68.9 kg  ? ? ? ?Data Reviewed:  ? ?CBC: ?Recent Labs  ?Lab 04/18/22 ?0001 04/18/22 ?0856 04/18/22 ?1426 04/18/22 ?2001 04/19/22 ?3976 04/20/22 ?7341  ?WBC 4.5  --   --  3.0* 2.8* 3.5*  ?NEUTROABS 3.0  --   --   --   --   --   ?HGB 5.9* 7.2* 7.1* 7.2* 7.5* 7.1*  ?HCT 18.0* 22.6* 22.1* 22.5* 23.2* 22.3*  ?  MCV 98.4  --   --  92.2 92.1 93.7  ?PLT 177  --   --  141* 151 147*  ? ?Basic Metabolic Panel: ?Recent Labs  ?Lab 04/18/22 ?0001 04/19/22 ?6967 04/20/22 ?8938  ?NA 139 140 141  ?K 4.1 3.7 3.2*  ?CL 109 114* 112*  ?CO2 '25 25 24  '$ ?GLUCOSE 145* 112* 111*  ?BUN '16 10 8  '$ ?CREATININE 0.68 0.78 0.74  ?CALCIUM 8.5* 8.6* 8.3*  ?MG  --  2.0 1.8  ? ?GFR: ?Estimated Creatinine Clearance: 85.3 mL/min (by C-G formula based on SCr of 0.74 mg/dL). ?Liver Function Tests: ?Recent Labs  ?Lab 04/18/22 ?0001  ?AST 21  ?ALT 16  ?ALKPHOS 58  ?BILITOT 0.4  ?PROT  6.2*  ?ALBUMIN 3.5  ? ?Recent Labs  ?Lab 04/18/22 ?0001  ?LIPASE 30  ? ?No results for input(s): AMMONIA in the last 168 hours. ?Coagulation Profile: ?No results for input(s): INR, PROTIME in the last 168 hours. ?Cardiac Enzymes: ?No results for input(s): CKTOTAL, CKMB, CKMBINDEX, TROPONINI in the last 168 hours. ?BNP (last 3 results) ?No results for input(s): PROBNP in the last 8760 hours. ?HbA1C: ?No results for input(s): HGBA1C in the last 72 hours. ?CBG: ?Recent Labs  ?Lab 04/19/22 ?2132 04/20/22 ?0308 04/20/22 ?1017 04/20/22 ?5102 04/20/22 ?1146  ?GLUCAP 75 61* 138* 111* 155*  ? ?Lipid Profile: ?No results for input(s): CHOL, HDL, LDLCALC, TRIG, CHOLHDL, LDLDIRECT in the last 72 hours. ?Thyroid Function Tests: ?No results for input(s): TSH, T4TOTAL, FREET4, T3FREE, THYROIDAB in the last 72 hours. ?Anemia Panel: ?Recent Labs  ?  04/18/22 ?5852  ?VITAMINB12 496  ?FERRITIN 6*  ?TIBC 333  ?IRON 28*  ? ?Sepsis Labs: ?No results for input(s): PROCALCITON, LATICACIDVEN in the last 168 hours. ? ?No results found for this or any previous visit (from the past 240 hour(s)).  ? ? ? ? ? ?Radiology Studies: ?No results found. ? ? ? ? ? ?Scheduled Meds: ? sodium chloride   Intravenous Once  ? atorvastatin  40 mg Oral Daily  ? insulin aspart  0-9 Units Subcutaneous TID AC & HS  ? [START ON 04/21/2022] pantoprazole  40 mg Intravenous Q12H  ? polyethylene glycol  17 g Oral BID  ? potassium chloride  40 mEq Oral Q4H  ? ?Continuous Infusions: ? dextrose 5 % and 0.9% NaCl 100 mL/hr at 04/20/22 0402  ? pantoprazole 8 mg/hr (04/19/22 2356)  ? ? ? LOS: 2 days  ? ?Time spent= 35 mins ? ? ? ?Amire Leazer Arsenio Loader, MD ?[facility] ? ?If 7PM-7AM, please contact night-coverage ? ?04/20/2022, 1:09 PM  ? ?

## 2022-04-20 NOTE — Plan of Care (Signed)
?  Problem: Health Behavior/Discharge Planning: ?Goal: Ability to manage health-related needs will improve ?Outcome: Progressing ?  ?Problem: Clinical Measurements: ?Goal: Ability to maintain clinical measurements within normal limits will improve ?Outcome: Progressing ?  ?Problem: Clinical Measurements: ?Goal: Will remain free from infection ?Outcome: Progressing ?  ?Problem: Clinical Measurements: ?Goal: Respiratory complications will improve ?Outcome: Progressing ?  ?Problem: Activity: ?Goal: Risk for activity intolerance will decrease ?Outcome: Progressing ?  ?Problem: Elimination: ?Goal: Will not experience complications related to urinary retention ?Outcome: Progressing ?  ?Problem: Pain Managment: ?Goal: General experience of comfort will improve ?Outcome: Progressing ?  ?Problem: Safety: ?Goal: Ability to remain free from injury will improve ?Outcome: Progressing ?  ?

## 2022-04-20 NOTE — Progress Notes (Signed)
? ?  Samuel Pennington , MD ?[address] [place], [place], [place], [address] ?[address], [place], [place], [place], [address] ?Phone: [phone]  ?Fax: [phone] ? ? ?Samuel Pennington is being followed for melena  Day 2 of follow up  ? ?Subjective: ?No bowel movements no complaints.  ? ? ?Objective: ?Vital signs in last 24 hours: ?Vitals:  ? 04/19/22 2349 04/20/22 0312 04/20/22 0816 04/20/22 1120  ?BP: (!) 104/59 (!) 102/54 (!) 99/52 105/61  ?Pulse:    64  ?Resp: '18 18 18 19  '$ ?Temp: 97.9 ?F (36.6 ?C) 98.4 ?F (36.9 ?C) 98.1 ?F (36.7 ?C) 98 ?F (36.7 ?C)  ?TempSrc: Oral Oral Oral Oral  ?SpO2: 98% 99% 96% 97%  ?Weight:      ?Height:      ? ?Weight change: -0.047 kg ? ?Intake/Output Summary (Last 24 hours) at 04/20/2022 1126 ?Last data filed at 04/20/2022 1018 ?Gross per 24 hour  ?Intake 2245.48 ml  ?Output 1075 ml  ?Net 1170.48 ml  ? ? ? ?Exam: ? ?Abdomen: soft, nontender, normal bowel sounds ? ? ?Lab Results: ?'@LABTEST2'$ @ ?Micro Results: ?No results found for this or any previous visit (from the past 240 hour(s)). ?Studies/Results: ?No results found. ?Medications: I have reviewed the patient's current medications. ?Scheduled Meds: ? sodium chloride   Intravenous Once  ? atorvastatin  40 mg Oral Daily  ? insulin aspart  0-9 Units Subcutaneous TID AC & HS  ? [START ON 04/21/2022] pantoprazole  40 mg Intravenous Q12H  ? ?Continuous Infusions: ? dextrose 5 % and 0.9% NaCl 100 mL/hr at 04/20/22 0402  ? pantoprazole 8 mg/hr (04/19/22 2356)  ? potassium chloride 10 mEq (04/20/22 1000)  ? ?PRN Meds:.acetaminophen **OR** acetaminophen, ALPRAZolam, dextromethorphan-guaiFENesin, glucagon (human recombinant), hydrALAZINE, ipratropium-albuterol, metoprolol tartrate, OLANZapine, ondansetron **OR** ondansetron (ZOFRAN) IV, oxyCODONE, senna-docusate, traZODone ? ? ?Assessment: ?Principal Problem: ?  GI bleeding ?Active Problems: ?  DM2 (diabetes mellitus, type 2) ([place]) ?  Acute blood loss anemia ?  Squamous cell esophageal cancer  (HCC) ?  GERD without esophagitis ? ?Samuel Pennington 56 y.o. male admitted with melena, h/o Sq cell ca of esophagus s/p radaition and chemotherapy. EGD showed large ulcer clean based with oozing at edges in the cardia - difficult location to reach, Epinephrine injected with hemostasis- could only place a single clip to mark location in case of vascular embolization needed. Overnight Hb stable  ? ?Plan: ?Monitor CBC and transfuse as needed ?IV PPI GTT for 72 hours ?If Hb gradually trends down then will consider repeat EGD and application of hemospray  ?IF has a severe bleed then call vascular surgery to embolize  ?H pylori stool antigen follow up ?Avoid NSAID's  ?Can proceed to soft diet this evening if hb stable ?8. Commence on miralax for constipation  ? ? LOS: 2 days  ? ?Samuel Bellows, MD ?04/20/2022, 11:26 AM  ?

## 2022-04-21 LAB — TYPE AND SCREEN
ABO/RH(D): A POS
Antibody Screen: NEGATIVE
Unit division: 0
Unit division: 0
Unit division: 0

## 2022-04-21 LAB — CBC
HCT: 26.3 % — ABNORMAL LOW (ref 39.0–52.0)
Hemoglobin: 8.4 g/dL — ABNORMAL LOW (ref 13.0–17.0)
MCH: 29.3 pg (ref 26.0–34.0)
MCHC: 31.9 g/dL (ref 30.0–36.0)
MCV: 91.6 fL (ref 80.0–100.0)
Platelets: 159 10*3/uL (ref 150–400)
RBC: 2.87 MIL/uL — ABNORMAL LOW (ref 4.22–5.81)
RDW: 18.5 % — ABNORMAL HIGH (ref 11.5–15.5)
WBC: 4.1 10*3/uL (ref 4.0–10.5)
nRBC: 0 % (ref 0.0–0.2)

## 2022-04-21 LAB — BASIC METABOLIC PANEL WITH GFR
Anion gap: 5 (ref 5–15)
BUN: 8 mg/dL (ref 6–20)
CO2: 26 mmol/L (ref 22–32)
Calcium: 8.7 mg/dL — ABNORMAL LOW (ref 8.9–10.3)
Chloride: 113 mmol/L — ABNORMAL HIGH (ref 98–111)
Creatinine, Ser: 0.76 mg/dL (ref 0.61–1.24)
GFR, Estimated: >60 mL/min (ref >60)
Glucose, Bld: 92 mg/dL (ref 70–99)
Potassium: 4.2 mmol/L (ref 3.5–5.1)
Sodium: 144 mmol/L (ref 135–145)

## 2022-04-21 LAB — GLUCOSE, CAPILLARY
Glucose-Capillary: 100 mg/dL — ABNORMAL HIGH (ref 70–99)
Glucose-Capillary: 248 mg/dL — ABNORMAL HIGH (ref 70–99)

## 2022-04-21 LAB — BPAM RBC
Blood Product Expiration Date: 202306062359
Blood Product Expiration Date: 202306062359
Blood Product Expiration Date: 202306082359
ISSUE DATE / TIME: 202305100218
ISSUE DATE / TIME: 202305100440
ISSUE DATE / TIME: 202305121447
Unit Type and Rh: 6200
Unit Type and Rh: 6200
Unit Type and Rh: 6200

## 2022-04-21 LAB — MAGNESIUM: Magnesium: 2 mg/dL (ref 1.7–2.4)

## 2022-04-21 MED ORDER — PANTOPRAZOLE SODIUM 40 MG PO TBEC: 40.0000 mg | DELAYED_RELEASE_TABLET | Freq: Two times a day (BID) | ORAL | 0 refills | Status: DC

## 2022-04-21 MED ORDER — FERROUS SULFATE 325 (65 FE) MG PO TABS: 325.0000 mg | ORAL_TABLET | Freq: Every day | ORAL | 0 refills | Status: AC

## 2022-04-21 MED ORDER — SENNOSIDES-DOCUSATE SODIUM 8.6-50 MG PO TABS: 1.0000 | ORAL_TABLET | Freq: Every evening | ORAL | 0 refills | Status: DC | PRN

## 2022-04-21 MED ORDER — POLYETHYLENE GLYCOL 3350 17 G PO PACK: 17.0000 g | PACK | Freq: Every day | ORAL | 0 refills | Status: AC | PRN

## 2022-04-21 NOTE — Progress Notes (Signed)
?  Transition of Care (TOC) Screening Note ? ? ?Patient Details  ?Name: Samuel Pennington ?Date of Birth: 1965-12-22 ? ? ?Transition of Care (TOC) CM/SW Contact:    ?Alberteen Sam, LCSW ?Phone Number: ?04/21/2022, 11:26 AM ? ? ? ?Transition of Care Department [facility]) has reviewed patient and no TOC needs have been identified at this time. We will continue to monitor patient advancement through interdisciplinary progression rounds. If new patient transition needs arise, please place a TOC consult. ? Pricilla Riffle, [place] ?[phone] ? ?

## 2022-04-21 NOTE — Discharge Summary (Signed)
Physician Discharge Summary  ?Lincoln Ginley GGY:[id number] DOB: 1966-04-08 DOA: 04/18/2022 ? ?PCP: Theotis Burrow, MD ? ?Admit date: 04/18/2022 ?Discharge date: 04/21/2022 ? ?Admitted From: Home ?Disposition: Home ? ?Recommendations for Outpatient Follow-up:  ?Follow up with PCP in 1-2 weeks ?Please obtain BMP/CBC in one week your next doctors visit.  ?PPI twice daily twice daily before meals has been prescribed.  Will need to follow-up outpatient closely with gastroenterology. ?Iron supplements with regimen prescribed ?Advised avoiding eating spicy, acidic food, avoid caffeine, dark chocolates, NSAIDs. ? ? ?Discharge Condition: Stable ?CODE STATUS: Full code ?Diet recommendation: Diabetic ? ?Brief/Interim Summary: ?56 year old with history of esophageal adenocarcinoma, DM2, HLD who is expected to have surgical intervention for his cancer in June at [facility] admitted for acute onset melena with generalized weakness and presyncope.  Upon admission he was slightly tachycardic, blood loss anemia of hemoglobin 5.9, baseline 10.7.  Patient was started on IV PPI drip and GI team was consulted.  Endoscopy showed large oozing gastric ulcer requiring treatment.  This was treated with total of 72 hours of Protonix drip, transition to p.o. twice daily.  His bleeding appears to have subsided, tolerating orals without any issues.  Cleared for discharge with outpatient follow-up recommendations as stated above. ?  ?  ?Assessment & Plan: ? Principal Problem: ?  GI bleeding ?Active Problems: ?  Acute blood loss anemia ?  Squamous cell esophageal cancer (HCC) ?  DM2 (diabetes mellitus, type 2) ([place]) ?  GERD without esophagitis ?  ?  ?  ?Assessment and Plan: ?* GI bleeding, gastric ulcer ?Acute blood loss anemia, admission hemoglobin 5.9 ?Severe iron deficiency ?- EGD today showed large gastric ulcer, patient has completed Protonix drip for 72 hours, PPI twice daily upon discharge.  Will need to follow-up outpatient with  gastroenterology very closely.  Repeat CBC in about 5 days with PCP.  Iron supplements with bowel regimen prescribed.  Hemoglobin remained stable. ?  ?Presyncope, resolved ?- Suspect related to blood loss and anemia. ?  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 ?  ?DM2 (diabetes mellitus, type 2) ([place]) ?-Resume home regimen ?  ?GERD without esophagitis ?- PPI therapy as mentioned above ?  ?  ? ? ?Consultations: ?Gastroenterology ? ?Subjective: ?Feels well no complaints, tolerating oral.  Denies any evidence of bleeding.  He would like to be discharged today since he is doing very well ? ?Discharge Exam: ?Vitals:  ? 04/21/22 0407 04/21/22 0828  ?BP: (!) 96/48 117/64  ?Pulse: 63 (!) 58  ?Resp:  18  ?Temp:  98 ?F (36.7 ?C)  ?SpO2: 96% 100%  ? ?Vitals:  ? 04/20/22 1954 04/20/22 2332 04/21/22 0407 04/21/22 0350  ?BP: 111/62 (!) 98/58 (!) 96/48 117/64  ?Pulse: 68 67 63 (!) 58  ?Resp: 18   18  ?Temp: 98.2 ?F (36.8 ?C) 97.9 ?F (36.6 ?C)  98 ?F (36.7 ?C)  ?TempSrc: Oral Oral  Oral  ?SpO2: 100% 98% 96% 100%  ?Weight:      ?Height:      ? ? ?General: Pt is alert, awake, not in acute distress ?Cardiovascular: RRR, S1/S2 +, no rubs, no gallops ?Respiratory: CTA bilaterally, no wheezing, no rhonchi ?Abdominal: Soft, NT, ND, bowel sounds + ?Extremities: no edema, no cyanosis ? ?Discharge Instructions ? ? ?Allergies as of 04/21/2022   ?  No Known Allergies ?  ? ?  ?Medication List  ?  ? ?STOP taking these medications   ? ?cyclobenzaprine 10 MG tablet ?Commonly known as: FLEXERIL ?  ?lidocaine-prilocaine cream ?Commonly known as: EMLA ?  ?ondansetron 8 MG tablet ?Commonly known as: ZOFRAN ?  ? ?  ? ?TAKE these medications   ? ?ALPRAZolam 0.25 MG tablet ?Commonly known as: Duanne Moron ?Take 1 tablet (0.25 mg total) by mouth at bedtime as  needed for anxiety. ?  ?atorvastatin 40 MG tablet ?Commonly known as: LIPITOR ?TOMA UNA TABLETA POR BOCA AL ACOSTARSE PARA COLESTEROL ALTO ?  ?ferrous sulfate 325 (65 FE) MG tablet ?Take 1 tablet (325 mg total) by mouth daily with breakfast. ?  ?glipiZIDE 5 MG 24 hr tablet ?Commonly known as: GLUCOTROL XL ?TOME 1 TABLETA POR BOCA DIARIO PARA LA DIABETES ?  ?metFORMIN 1000 MG tablet ?Commonly known as: GLUCOPHAGE ?Take 1,000 mg by mouth daily. ?  ?OLANZapine 5 MG tablet ?Commonly known as: ZyPREXA ?Take 1 tablet (5 mg total) by mouth at bedtime as needed (nausea). ?  ?oxyCODONE 5 MG immediate release tablet ?Commonly known as: Oxy IR/ROXICODONE ?Take 10 mg by mouth every 6 (six) hours as needed for pain. ?  ?pantoprazole 40 MG tablet ?Commonly known as: PROTONIX ?Take 1 tablet (40 mg total) by mouth 2 (two) times daily before a meal. ?What changed: when to take this ?  ?polyethylene glycol 17 g packet ?Commonly known as: MIRALAX / GLYCOLAX ?Take 17 g by mouth daily as needed for severe constipation. ?  ?senna-docusate 8.6-50 MG tablet ?Commonly known as: Senokot-S ?Take 1 tablet by mouth at bedtime as needed for mild constipation. ?  ?sucralfate 1 g tablet ?Commonly known as: Carafate ?Take 1 tablet (1 g total) by mouth 4 (four) times daily -  with meals and at bedtime. ?  ? ?  ? ? Follow-up Information   ? ? Revelo, Elyse Jarvis, MD Follow up in 1 week(s).   ?Specialty: Family Medicine ?Contact information: ?[address] ?[address] ?[place] [address] ?[phone] ? ? ?  ?  ? ?  ?  ? ?  ? ?No Known Allergies ? ?You were cared for by a hospitalist during your hospital stay. If you have any questions about your discharge medications or the care you received while you were in the hospital after you are discharged, you can call the unit and asked to speak with the hospitalist on call if the hospitalist that took care of you is not available. Once you are discharged, your primary care physician will handle any  further medical issues. Please note that no refills for any discharge medications will be authorized once you are discharged, as it is imperative that you return to your primary care physician (or establish a relationship with a primary care physician if you do not have one) for your aftercare needs so that they can reassess your need for medications and monitor your lab values. ? ? ?Procedures/Studies: ?No results found. ? ? ?The results of significant diagnostics from this hospitalization (including imaging, microbiology, ancillary and laboratory) are listed below for reference.   ? ? ?Microbiology: ?No results found for this or any previous visit (from the past 240 hour(s)).  ? ?Labs: ?BNP (last 3 results) ?No results for input(s): BNP in the last 8760 hours. ?Basic Metabolic Panel: ?Recent Labs  ?Lab 04/18/22 ?0001 04/19/22 ?1751 04/20/22 ?0258 04/21/22 ?0439  ?NA 139 140 141 144  ?K 4.1 3.7 3.2* 4.2  ?CL  109 114* 112* 113*  ?CO2 '25 25 24 26  '$ ?GLUCOSE 145* 112* 111* 92  ?BUN '16 10 8 8  '$ ?CREATININE 0.68 0.78 0.74 0.76  ?CALCIUM 8.5* 8.6* 8.3* 8.7*  ?MG  --  2.0 1.8 2.0  ? ?Liver Function Tests: ?Recent Labs  ?Lab 04/18/22 ?0001  ?AST 21  ?ALT 16  ?ALKPHOS 58  ?BILITOT 0.4  ?PROT 6.2*  ?ALBUMIN 3.5  ? ?Recent Labs  ?Lab 04/18/22 ?0001  ?LIPASE 30  ? ?No results for input(s): AMMONIA in the last 168 hours. ?CBC: ?Recent Labs  ?Lab 04/18/22 ?0001 04/18/22 ?0856 04/18/22 ?2001 04/19/22 ?4136 04/20/22 ?4383 04/20/22 ?1925 04/21/22 ?0439  ?WBC 4.5  --  3.0* 2.8* 3.5* 4.9 4.1  ?NEUTROABS 3.0  --   --   --   --   --   --   ?HGB 5.9*   < > 7.2* 7.5* 7.1* 9.0* 8.4*  ?HCT 18.0*   < > 22.5* 23.2* 22.3* 28.1* 26.3*  ?MCV 98.4  --  92.2 92.1 93.7 90.6 91.6  ?PLT 177  --  141* 151 147* 166 159  ? < > = values in this interval not displayed.  ? ?Cardiac Enzymes: ?No results for input(s): CKTOTAL, CKMB, CKMBINDEX, TROPONINI in the last 168 hours. ?BNP: ?Invalid input(s): POCBNP ?CBG: ?Recent Labs  ?Lab 04/20/22 ?0814  04/20/22 ?1146 04/20/22 ?1625 04/20/22 ?2131 04/21/22 ?7793  ?GLUCAP 111* 155* 135* 165* 100*  ? ?D-Dimer ?No results for input(s): DDIMER in the last 72 hours. ?Hgb A1c ?No results for input(s): HGBA1C in the last 72 hours. ?Nicoletta Dress

## 2022-04-21 NOTE — Plan of Care (Signed)
  Problem: Clinical Measurements: Goal: Ability to maintain clinical measurements within normal limits will improve Outcome: Progressing   Problem: Clinical Measurements: Goal: Will remain free from infection Outcome: Progressing   

## 2022-04-30 ENCOUNTER — Encounter: Payer: Self-pay | Admitting: Oncology

## 2022-04-30 ENCOUNTER — Inpatient Hospital Stay
Admission: EM | Admit: 2022-04-30 | Discharge: 2022-05-02 | DRG: 378 | Disposition: A | Discharge status: Home / Self Care | Payer: Self-pay | Attending: Student in an Organized Health Care Education/Training Program | Admitting: Student in an Organized Health Care Education/Training Program

## 2022-04-30 ENCOUNTER — Other Ambulatory Visit: Payer: Self-pay

## 2022-04-30 DIAGNOSIS — F419 Anxiety disorder, unspecified: Secondary | ICD-10-CM | POA: Diagnosis present

## 2022-04-30 DIAGNOSIS — Z923 Personal history of irradiation: Secondary | ICD-10-CM

## 2022-04-30 DIAGNOSIS — K922 Gastrointestinal hemorrhage, unspecified: Secondary | ICD-10-CM

## 2022-04-30 DIAGNOSIS — D500 Iron deficiency anemia secondary to blood loss (chronic): Secondary | ICD-10-CM

## 2022-04-30 DIAGNOSIS — E785 Hyperlipidemia, unspecified: Secondary | ICD-10-CM | POA: Diagnosis present

## 2022-04-30 DIAGNOSIS — Z7984 Long term (current) use of oral hypoglycemic drugs: Secondary | ICD-10-CM

## 2022-04-30 DIAGNOSIS — Z8249 Family history of ischemic heart disease and other diseases of the circulatory system: Secondary | ICD-10-CM

## 2022-04-30 DIAGNOSIS — K219 Gastro-esophageal reflux disease without esophagitis: Secondary | ICD-10-CM

## 2022-04-30 DIAGNOSIS — E119 Type 2 diabetes mellitus without complications: Secondary | ICD-10-CM

## 2022-04-30 DIAGNOSIS — D72819 Decreased white blood cell count, unspecified: Secondary | ICD-10-CM | POA: Diagnosis present

## 2022-04-30 DIAGNOSIS — Z79899 Other long term (current) drug therapy: Secondary | ICD-10-CM

## 2022-04-30 DIAGNOSIS — R001 Bradycardia, unspecified: Secondary | ICD-10-CM | POA: Diagnosis present

## 2022-04-30 DIAGNOSIS — Z9221 Personal history of antineoplastic chemotherapy: Secondary | ICD-10-CM

## 2022-04-30 DIAGNOSIS — C159 Malignant neoplasm of esophagus, unspecified: Secondary | ICD-10-CM | POA: Diagnosis present

## 2022-04-30 DIAGNOSIS — I10 Essential (primary) hypertension: Secondary | ICD-10-CM | POA: Diagnosis present

## 2022-04-30 DIAGNOSIS — K254 Chronic or unspecified gastric ulcer with hemorrhage: Principal | ICD-10-CM | POA: Diagnosis present

## 2022-04-30 DIAGNOSIS — C155 Malignant neoplasm of lower third of esophagus: Secondary | ICD-10-CM | POA: Diagnosis present

## 2022-04-30 DIAGNOSIS — D62 Acute posthemorrhagic anemia: Secondary | ICD-10-CM

## 2022-04-30 DIAGNOSIS — K317 Polyp of stomach and duodenum: Secondary | ICD-10-CM | POA: Diagnosis present

## 2022-04-30 DIAGNOSIS — R42 Dizziness and giddiness: Secondary | ICD-10-CM | POA: Diagnosis present

## 2022-04-30 DIAGNOSIS — D509 Iron deficiency anemia, unspecified: Secondary | ICD-10-CM | POA: Diagnosis present

## 2022-04-30 DIAGNOSIS — K921 Melena: Secondary | ICD-10-CM

## 2022-04-30 LAB — CBC
HCT: 26.6 % — ABNORMAL LOW (ref 39.0–52.0)
HCT: 30.8 % — ABNORMAL LOW (ref 39.0–52.0)
Hemoglobin: 8.2 g/dL — ABNORMAL LOW (ref 13.0–17.0)
Hemoglobin: 9.4 g/dL — ABNORMAL LOW (ref 13.0–17.0)
MCH: 30.6 pg (ref 26.0–34.0)
MCH: 31 pg (ref 26.0–34.0)
MCHC: 30.8 g/dL (ref 30.0–36.0)
MCHC: 30.5 g/dL (ref 30.0–36.0)
MCV: 99.3 fL (ref 80.0–100.0)
MCV: 101.7 fL — ABNORMAL HIGH (ref 80.0–100.0)
Platelets: 177 10*3/uL (ref 150–400)
Platelets: 146 10*3/uL — ABNORMAL LOW (ref 150–400)
RBC: 2.68 MIL/uL — ABNORMAL LOW (ref 4.22–5.81)
RBC: 3.03 MIL/uL — ABNORMAL LOW (ref 4.22–5.81)
RDW: 20.2 % — ABNORMAL HIGH (ref 11.5–15.5)
RDW: 20.6 % — ABNORMAL HIGH (ref 11.5–15.5)
WBC: 3.7 10*3/uL — ABNORMAL LOW (ref 4.0–10.5)
WBC: 3.7 10*3/uL — ABNORMAL LOW (ref 4.0–10.5)
nRBC: 0 % (ref 0.0–0.2)
nRBC: 0 % (ref 0.0–0.2)

## 2022-04-30 LAB — COMPREHENSIVE METABOLIC PANEL
ALT: 16 U/L (ref 0–44)
AST: 17 U/L (ref 15–41)
Albumin: 3.9 g/dL (ref 3.5–5.0)
Alkaline Phosphatase: 61 U/L (ref 38–126)
Anion gap: 6 (ref 5–15)
BUN: 15 mg/dL (ref 6–20)
CO2: 29 mmol/L (ref 22–32)
Calcium: 8.8 mg/dL — ABNORMAL LOW (ref 8.9–10.3)
Chloride: 103 mmol/L (ref 98–111)
Creatinine, Ser: 0.9 mg/dL (ref 0.61–1.24)
GFR, Estimated: >60 mL/min (ref >60)
Glucose, Bld: 178 mg/dL — ABNORMAL HIGH (ref 70–99)
Potassium: 3.9 mmol/L (ref 3.5–5.1)
Sodium: 138 mmol/L (ref 135–145)
Total Bilirubin: 0.7 mg/dL (ref 0.3–1.2)
Total Protein: 6.7 g/dL (ref 6.5–8.1)

## 2022-04-30 LAB — APTT: aPTT: 27 seconds (ref 24–36)

## 2022-04-30 LAB — PROTIME-INR
INR: 1 (ref 0.8–1.2)
Prothrombin Time: 13.1 seconds (ref 11.4–15.2)

## 2022-04-30 MED ORDER — POLYETHYLENE GLYCOL 3350 17 G PO PACK
17.0000 g | PACK | Freq: Every day | ORAL | Status: DC | PRN
  Administered 2022-05-02: 17 g via ORAL
  Filled 2022-04-30: qty 1

## 2022-04-30 MED ORDER — PANTOPRAZOLE INFUSION (NEW) - SIMPLE MED
8.0000 mg/h | INTRAVENOUS | Status: DC
  Administered 2022-04-30 – 2022-05-01 (×5): 8 mg/h via INTRAVENOUS
  Filled 2022-04-30 (×5): qty 100

## 2022-04-30 MED ORDER — OXYCODONE HCL 5 MG PO TABS
10.0000 mg | ORAL_TABLET | Freq: Four times a day (QID) | ORAL | Status: DC | PRN
  Administered 2022-05-01 – 2022-05-02 (×4): 10 mg via ORAL
  Filled 2022-04-30 (×4): qty 2

## 2022-04-30 MED ORDER — ONDANSETRON HCL 4 MG/2ML IJ SOLN
4.0000 mg | Freq: Once | INTRAMUSCULAR | Status: AC
  Administered 2022-04-30: 4 mg via INTRAVENOUS
  Filled 2022-04-30: qty 2

## 2022-04-30 MED ORDER — SENNOSIDES-DOCUSATE SODIUM 8.6-50 MG PO TABS: 1.0000 | ORAL_TABLET | Freq: Every evening | ORAL | Status: DC | PRN

## 2022-04-30 MED ORDER — SODIUM CHLORIDE 0.9 % IV SOLN: INTRAVENOUS | Status: DC

## 2022-04-30 MED ORDER — ALPRAZOLAM 0.25 MG PO TABS: 0.2500 mg | ORAL_TABLET | Freq: Every evening | ORAL | Status: DC | PRN

## 2022-04-30 MED ORDER — FERROUS SULFATE 325 (65 FE) MG PO TABS
325.0000 mg | ORAL_TABLET | Freq: Every day | ORAL | Status: DC
  Administered 2022-05-01 – 2022-05-02 (×2): 325 mg via ORAL
  Filled 2022-04-30 (×2): qty 1

## 2022-04-30 MED ORDER — SODIUM CHLORIDE 0.9 % IV BOLUS
1000.0000 mL | Freq: Once | INTRAVENOUS | Status: AC
  Administered 2022-04-30: 1000 mL via INTRAVENOUS

## 2022-04-30 MED ORDER — ATORVASTATIN CALCIUM 20 MG PO TABS
40.0000 mg | ORAL_TABLET | Freq: Every evening | ORAL | Status: DC
  Administered 2022-05-01: 40 mg via ORAL
  Filled 2022-04-30: qty 2

## 2022-04-30 MED ORDER — MORPHINE SULFATE (PF) 4 MG/ML IV SOLN
4.0000 mg | Freq: Once | INTRAVENOUS | Status: AC
  Administered 2022-04-30: 4 mg via INTRAVENOUS
  Filled 2022-04-30: qty 1

## 2022-04-30 MED ORDER — ACETAMINOPHEN 325 MG PO TABS
650.0000 mg | ORAL_TABLET | Freq: Four times a day (QID) | ORAL | Status: DC | PRN
Start: 2022-04-30 — End: 2022-05-02

## 2022-04-30 MED ORDER — ONDANSETRON HCL 4 MG/2ML IJ SOLN: 4.0000 mg | Freq: Three times a day (TID) | INTRAMUSCULAR | Status: DC | PRN

## 2022-04-30 MED ORDER — SODIUM CHLORIDE 0.9 % IV SOLN
300.0000 mg | Freq: Once | INTRAVENOUS | Status: AC
  Administered 2022-04-30: 300 mg via INTRAVENOUS
  Filled 2022-04-30: qty 300

## 2022-04-30 MED ORDER — PANTOPRAZOLE 80MG IVPB - SIMPLE MED
80.0000 mg | Freq: Once | INTRAVENOUS | Status: AC
  Administered 2022-04-30: 80 mg via INTRAVENOUS
  Filled 2022-04-30: qty 100

## 2022-04-30 NOTE — H&P (Signed)
History and Physical    Farhaan Mabee UYQ:[id number] DOB: [birth year] DOA: 04/30/2022  Referring MD/NP/PA:   PCP: Theotis Burrow, MD   Patient coming from:  The patient is coming from home.  At baseline, pt is independent for most of ADL.        Chief Complaint: rectal bleeding  HPI: Samuel Pennington is a 56 y.o. 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  Patient was recently hospitalized from 5/10 - 5/13 due to GI bleeding.  Patient had EGD done, which showed large gastric ulcer which is clipped. Patient was treated with Protonix drip. His hemoglobin down to 5.9 on 04/18/2022, which improved to 9.0 on 04/20/2022 after blood transfusion.  Patient had mild epigastric discomfort yesterday, which has resolved.  Currently no nausea, vomiting, abdominal pain or diarrhea.  Patient does not have chest pain, shortness breath, cough.  No fever or chills.  Patient has mild lightheadedness.  No symptoms of UTI.  EGD on 04/19/22: - Normal examined duodenum. - Malignant esophageal tumor was found in the middle third of the esophagus. - Oozing gastric ulcer with a clean ulcer base (Forrest Class III). Injected. Clip was placed. - Normal examined duodenum. - No specimens collected.   Data Reviewed and ED Course: pt was found to have hemoglobin 8.2 (9.0 on 04/20/2022), GFR> 60, WBC 3.7, temperature normal, blood pressure 95/52, heart rate 57, RR 18, oxygen saturation 100% on room air.  Patient is admitted to telemetry bed as inpatient.  Dr. Vicente Males of GI is consulted.  EKG:  Not done in ED, will get one.     Review of Systems:   General: no fevers, chills, no body weight gain, fatigue HEENT: no blurry vision, hearing changes or sore throat Respiratory: no  dyspnea, coughing, wheezing CV: no chest pain, no palpitations GI: no nausea, vomiting, abdominal pain, diarrhea, constipation.  Has dark stool. GU: no dysuria, burning on urination, increased urinary frequency, hematuria  Ext: no leg edema Neuro: no unilateral weakness, numbness, or tingling, no vision change or hearing loss.  Has mild lightheadedness. Skin: no rash, no skin tear. MSK: No muscle spasm, no deformity, no limitation of range of movement in spin Heme: No easy bruising.  Travel history: No recent long distant travel.   Allergy: No Known Allergies  Past Medical History:  Diagnosis Date   Cancer ([place]) 2022   Diabetes mellitus without complication ([place])    Hyperlipidemia    Neuromuscular disorder ([place])     Past Surgical History:  Procedure Laterality Date   COLONOSCOPY WITH PROPOFOL N/A 11/14/2021   Procedure: COLONOSCOPY WITH PROPOFOL;  Surgeon: Lesly Rubenstein, MD;  Location: [facility] ENDOSCOPY;  Service: Endoscopy;  Laterality: N/A;   ESOPHAGOGASTRODUODENOSCOPY (EGD) WITH PROPOFOL N/A 11/14/2021   Procedure: ESOPHAGOGASTRODUODENOSCOPY (EGD) WITH PROPOFOL;  Surgeon: Lesly Rubenstein, MD;  Location: [facility] ENDOSCOPY;  Service: Endoscopy;  Laterality: N/A;   ESOPHAGOGASTRODUODENOSCOPY (EGD) WITH PROPOFOL N/A 04/19/2022   Procedure: ESOPHAGOGASTRODUODENOSCOPY (EGD) WITH PROPOFOL;  Surgeon: Jonathon Bellows, MD;  Location: [facility] ENDOSCOPY;  Service: Gastroenterology;  Laterality: N/A;   EUS N/A 12/14/2021   Procedure: FULL UPPER ENDOSCOPIC ULTRASOUND (EUS) RADIAL;  Surgeon: Holly Bodily, MD;  Location: [facility] ENDOSCOPY;  Service: Gastroenterology;  Laterality: N/A;  SPANISH INTERPRETER    Social History:  reports that he has never smoked. He has  never used smokeless tobacco. He reports current alcohol use. He reports that he does not use drugs.  Family History:  Family History  Problem Relation Age of Onset   Hypertension Mother      Prior to Admission medications    Medication Sig Start Date End Date Taking? Authorizing Provider  ALPRAZolam (XANAX) 0.25 MG tablet Take 1 tablet (0.25 mg total) by mouth at bedtime as needed for anxiety. 03/22/22   Lloyd Huger, MD  atorvastatin (LIPITOR) 40 MG tablet TOMA UNA TABLETA POR BOCA AL ACOSTARSE PARA COLESTEROL ALTO 07/06/20 03/21/22  Revelo, Elyse Jarvis, MD  ferrous sulfate 325 (65 FE) MG tablet Take 1 tablet (325 mg total) by mouth daily with breakfast. 04/21/22 05/21/22  Amin, Jeanella Flattery, MD  glipiZIDE (GLUCOTROL XL) 5 MG 24 hr tablet TOME 1 TABLETA POR BOCA DIARIO PARA LA DIABETES 12/13/20 04/18/22  Revelo, Elyse Jarvis, MD  metFORMIN (GLUCOPHAGE) 1000 MG tablet Take 1,000 mg by mouth daily. 08/27/18   Revelo, Elyse Jarvis, MD  OLANZapine (ZYPREXA) 5 MG tablet Take 1 tablet (5 mg total) by mouth at bedtime as needed (nausea). 04/10/22   Borders, Kirt Boys, NP  oxyCODONE (OXY IR/ROXICODONE) 5 MG immediate release tablet Take 10 mg by mouth every 6 (six) hours as needed for pain. 11/06/21   [provider]  pantoprazole (PROTONIX) 40 MG tablet Take 1 tablet (40 mg total) by mouth 2 (two) times daily before a meal. 04/21/22 05/21/22  Amin, Jeanella Flattery, MD  polyethylene glycol (MIRALAX / GLYCOLAX) 17 g packet Take 17 g by mouth daily as needed for severe constipation. 04/21/22   Amin, Ankit Chirag, MD  senna-docusate (SENOKOT-S) 8.6-50 MG tablet Take 1 tablet by mouth at bedtime as needed for mild constipation. 04/21/22 05/21/22  Amin, Jeanella Flattery, MD  sucralfate (CARAFATE) 1 g tablet Take 1 tablet (1 g total) by mouth 4 (four) times daily -  with meals and at bedtime. Patient not taking: Reported on 04/18/2022 02/26/22   Hughie Closs, [place]    Physical Exam: Vitals:   04/30/22 2542 04/30/22 0833 04/30/22 0834 04/30/22 1033  BP:    (!) 95/52  Pulse:    (!) 57  Resp:    18  Temp:   97.9 F (36.6 C)   TempSrc:   Oral   SpO2:    100%  Weight:  68 kg    Height: '5\' 4"'$  (1.626 m)      General: Not  in acute distress.  Pale looking HEENT:       Eyes: PERRL, EOMI, no scleral icterus.       ENT: No discharge from the ears and nose, no pharynx injection, no tonsillar enlargement.        Neck: No JVD, no bruit, no mass felt. Heme: No neck lymph node enlargement. Cardiac: S1/S2, RRR, No murmurs, No gallops or rubs. Respiratory: No rales, wheezing, rhonchi or rubs. GI: Soft, nondistended, nontender, no rebound pain, no organomegaly, BS present. GU: No hematuria Ext: No pitting leg edema bilaterally. 1+DP/PT pulse bilaterally. Musculoskeletal: No joint deformities, No joint redness or warmth, no limitation of ROM in spin. Skin: No rashes.  Neuro: Alert, oriented X3, cranial nerves II-XII grossly intact, moves all extremities normally.  Psych: Patient is not psychotic, no suicidal or hemocidal ideation.  Labs on Admission: I have personally reviewed following labs and imaging studies  CBC: Recent Labs  Lab 04/30/22 0833  WBC 3.7*  HGB 8.2*  HCT 26.6*  MCV 99.3  PLT 361   Basic Metabolic Panel: Recent Labs  Lab 04/30/22 0833  NA 138  K 3.9  CL 103  CO2 29  GLUCOSE 178*  BUN 15  CREATININE 0.90  CALCIUM 8.8*   GFR: Estimated Creatinine Clearance: 75.8 mL/min (by C-G formula based on SCr of 0.9 mg/dL). Liver Function Tests: Recent Labs  Lab 04/30/22 0833  AST 17  ALT 16  ALKPHOS 61  BILITOT 0.7  PROT 6.7  ALBUMIN 3.9   No results for input(s): LIPASE, AMYLASE in the last 168 hours. No results for input(s): AMMONIA in the last 168 hours. Coagulation Profile: Recent Labs  Lab 04/30/22 0833  INR 1.0   Cardiac Enzymes: No results for input(s): CKTOTAL, CKMB, CKMBINDEX, TROPONINI in the last 168 hours. BNP (last 3 results) No results for input(s): PROBNP in the last 8760 hours. HbA1C: No results for input(s): HGBA1C in the last 72 hours. CBG: No results for input(s): GLUCAP in the last 168 hours. Lipid Profile: No results for input(s): CHOL, HDL, LDLCALC,  TRIG, CHOLHDL, LDLDIRECT in the last 72 hours. Thyroid Function Tests: No results for input(s): TSH, T4TOTAL, FREET4, T3FREE, THYROIDAB in the last 72 hours. Anemia Panel: No results for input(s): VITAMINB12, FOLATE, FERRITIN, TIBC, IRON, RETICCTPCT in the last 72 hours. Urine analysis:    Component Value Date/Time   COLORURINE STRAW (A) 04/18/2022 0001   APPEARANCEUR CLEAR (A) 04/18/2022 0001   LABSPEC 1.009 04/18/2022 0001   PHURINE 6.0 04/18/2022 0001   GLUCOSEU NEGATIVE 04/18/2022 0001   HGBUR NEGATIVE 04/18/2022 0001   BILIRUBINUR NEGATIVE 04/18/2022 0001   KETONESUR NEGATIVE 04/18/2022 0001   PROTEINUR NEGATIVE 04/18/2022 0001   NITRITE NEGATIVE 04/18/2022 0001   LEUKOCYTESUR NEGATIVE 04/18/2022 0001   Sepsis Labs: '@LABRCNTIP'$ (procalcitonin:4,lacticidven:4) )No results found for this or any previous visit (from the past 240 hour(s)).   Radiological Exams on Admission: No results found.    Assessment/Plan Principal Problem:   GI bleeding Active Problems:   Acute blood loss anemia   Iron deficiency anemia   Squamous cell esophageal cancer (HCC)   Diabetes mellitus without complication (HCC)   Hyperlipidemia   GERD without esophagitis   Anxiety   Principal Problem:   GI bleeding Active Problems:   Acute blood loss anemia   Iron deficiency anemia   Squamous cell esophageal cancer (HCC)   Diabetes mellitus without complication (HCC)   Hyperlipidemia   GERD without esophagitis   Anxiety   Assessment and Plan: * GI bleeding GI bleeding, acute blood loss anemia, iron deficiency anemia: Possibly due to upper GI bleeding secondary to gastric ulcer.  Hemoglobin 9.0 --> 8.2.  Patient is slightly lightheaded.  Hemodynamically stable. Consulted Dr. Vicente Males of GI - will admitted to med-surg bed as inpatient - NPO for possible EGD - IVF: 1L NS bolus, then at 100 mL/hr - Start IV pantoprazole gtt - Zofran IV for nausea - Avoid NSAIDs and SQ heparin - Maintain IV access  (2 large bore IVs if possible). - Monitor closely and follow q6h cbc, transfuse as necessary, if Hgb<7.0 - LaB: INR, PTT and type screen    Acute blood loss anemia -see above  Iron deficiency anemia -see above -Continue iron supplement  Squamous cell esophageal cancer ([place]) The patient completed 6 cycles of carboplatin and Taxol in February 2023 and XRT on 02/09/2022 and is expected to have GI surgery at [facility] later this month. Pt states that he has an appointment on 06/01/2022 in [facility]  Anxiety -prn Xanax  GERD without esophagitis - On Protonix drip  Hyperlipidemia - Lipitor  Diabetes mellitus without complication (HCC) Recent A1c 6.5, well controlled.  Patient is taking metformin and glipizide at home -Sliding scale insulin             DVT ppx: SCD  Code Status: Full code  Family Communication: not done, no family member is at bed side.     Disposition Plan:  Anticipate discharge back to previous environment  Consults called:  Dr. Vicente Males of GI  Admission status and Level of care: Med-Surg:   as inpt        Severity of Illness:  The appropriate patient status for this patient is INPATIENT. Inpatient status is judged to be reasonable and necessary in order to provide the required intensity of service to ensure the patient's safety. The patient's presenting symptoms, physical exam findings, and initial radiographic and laboratory data in the context of their chronic comorbidities is felt to place them at high risk for further clinical deterioration. Furthermore, it is not anticipated that the patient will be medically stable for discharge from the hospital within 2 midnights of admission.   * I certify that at the point of admission it is my clinical judgment that the patient will require inpatient hospital care spanning beyond 2 midnights from the point of admission due to high intensity of service, high risk for further deterioration and high  frequency of surveillance required.*       Date of Service 04/30/2022    Ivor Costa [facility]   If 7PM-7AM, please contact night-coverage [email or web address] 04/30/2022, 12:10 PM

## 2022-04-30 NOTE — Assessment & Plan Note (Signed)
Recent A1c 6.5, well controlled.  Patient is taking metformin and glipizide at home -Sliding scale insulin

## 2022-04-30 NOTE — ED Notes (Signed)
Placed pt in hospital bed for comfort.

## 2022-04-30 NOTE — ED Provider Notes (Signed)
[facility] Provider Note    Event Date/Time   First MD Initiated Contact with Patient 04/30/22 1009     (approximate)   History   Rectal Bleeding   HPI  Samuel Pennington is a 56 y.o. male  here with melena. Pt states that over the past 2-3 days he's had worsening melena, black stools. H/o esophageal/gastric CA s/p recent admission, which involved EGD showing gastric ulcer that was in a difficult location to access. He is seeing GI surgery at [facility] for surgery next month. He had some mild pain yesterday but this has resolved. No CP, SOB, lightheadedness. Stool has been black - no hematemesis, no brbpr. No fever, chills. No distension. Not on anticoagulation.       Physical Exam   Triage Vital Signs: ED Triage Vitals  Enc Vitals Group     BP 04/30/22 0831 124/71     Pulse Rate 04/30/22 0831 73     Resp 04/30/22 0831 18     Temp 04/30/22 0834 97.9 F (36.6 C)     Temp Source 04/30/22 0834 Oral     SpO2 04/30/22 0831 100 %     Weight 04/30/22 0833 150 lb (68 kg)     Height 04/30/22 0832 '5\' 4"'$  (1.626 m)     Head Circumference --      Peak Flow --      Pain Score 04/30/22 0832 7     Pain Loc --      Pain Edu? --      Excl. in [place]? --     Most recent vital signs: Vitals:   04/30/22 1033 04/30/22 1409  BP: (!) 95/52 (!) 95/52  Pulse: (!) 57 85  Resp: 18 (!) 25  Temp:    SpO2: 100% 98%     General: Awake, no distress.  CV:  Good peripheral perfusion.  Resp:  Normal effort.  Abd:  No distention. No TTP. Specifically, no epigastric tenderness, rebound, guarding. No distension. Other:  MMM, extremities warm and well perfused.   ED Results / Procedures / Treatments   Labs (all labs ordered are listed, but only abnormal results are displayed) Labs Reviewed  COMPREHENSIVE METABOLIC PANEL - Abnormal; Notable for the following components:      Result Value   Glucose, Bld 178 (*)    Calcium 8.8 (*)    All other components within normal  limits  CBC - Abnormal; Notable for the following components:   WBC 3.7 (*)    RBC 2.68 (*)    Hemoglobin 8.2 (*)    HCT 26.6 (*)    RDW 20.2 (*)    All other components within normal limits  PROTIME-INR  APTT  CBC  CBC  CBC  CBC  POC OCCULT BLOOD, ED  TYPE AND SCREEN     EKG    RADIOLOGY    I also independently reviewed and agree with radiologist interpretations.   PROCEDURES:  Critical Care performed: No  .1-3 Lead EKG Interpretation Performed by: Duffy Bruce, MD Authorized by: Duffy Bruce, MD     Interpretation: non-specific     ECG rate:  50-60   ECG rate assessment: bradycardic     Rhythm: sinus bradycardia     Ectopy: none     Conduction: normal   Comments:     Indication: Chest discomfort, GIB  MEDICATIONS ORDERED IN ED: Medications  pantoprozole (PROTONIX) 80 mg /NS 100 mL infusion (8 mg/hr Intravenous New Bag/Given 04/30/22 1123)  ondansetron (ZOFRAN) injection 4 mg (has no administration in time range)  acetaminophen (TYLENOL) tablet 650 mg (has no administration in time range)  0.9 %  sodium chloride infusion ( Intravenous New Bag/Given 04/30/22 1409)  oxyCODONE (Oxy IR/ROXICODONE) immediate release tablet 10 mg (has no administration in time range)  atorvastatin (LIPITOR) tablet 40 mg (has no administration in time range)  ALPRAZolam (XANAX) tablet 0.25 mg (has no administration in time range)  polyethylene glycol (MIRALAX / GLYCOLAX) packet 17 g (has no administration in time range)  senna-docusate (Senokot-S) tablet 1 tablet (has no administration in time range)  ferrous sulfate tablet 325 mg (has no administration in time range)  iron sucrose (VENOFER) 300 mg in sodium chloride 0.9 % 250 mL IVPB (has no administration in time range)  pantoprazole (PROTONIX) 80 mg /NS 100 mL IVPB (0 mg Intravenous Stopped 04/30/22 1123)  morphine (PF) 4 MG/ML injection 4 mg (4 mg Intravenous Given 04/30/22 1116)  ondansetron (ZOFRAN) injection 4 mg (4 mg  Intravenous Given 04/30/22 1116)  sodium chloride 0.9 % bolus 1,000 mL (0 mLs Intravenous Stopped 04/30/22 1409)     IMPRESSION / MDM / ASSESSMENT AND PLAN / ED COURSE  I reviewed the triage vital signs and the nursing notes.                               The patient is on the cardiac monitor to evaluate for evidence of arrhythmia and/or significant heart rate changes.   Ddx:  Differential includes the following, with pertinent life- or limb-threatening emergencies considered:  Recurrent UGIB from gastric ulcer, AVM, gastritis, radiation effect   MDM:  56 yo M with h/o esophageal adenoCA here with melena. Pt was just hospitalized 5/10 for similar sx, had a bleeding ulcer that was difficult to access via EGD. Here, pt appears HDS though BP borderline low. No CP. Abdomen soft, no signs of perforation. Hgb 8.2, down from 8.4 at time of d/c. CMP unremarkable .Not on anticoagulation. Discussed case with Dr. Marius Ditch of GI. Will start on PPI drip, admit for management. Pt updated and in agreement. Scheduled to have surgery at [facility] end of next month.   MEDICATIONS GIVEN IN ED: Medications  pantoprozole (PROTONIX) 80 mg /NS 100 mL infusion (8 mg/hr Intravenous New Bag/Given 04/30/22 1123)  ondansetron (ZOFRAN) injection 4 mg (has no administration in time range)  acetaminophen (TYLENOL) tablet 650 mg (has no administration in time range)  0.9 %  sodium chloride infusion ( Intravenous New Bag/Given 04/30/22 1409)  oxyCODONE (Oxy IR/ROXICODONE) immediate release tablet 10 mg (has no administration in time range)  atorvastatin (LIPITOR) tablet 40 mg (has no administration in time range)  ALPRAZolam (XANAX) tablet 0.25 mg (has no administration in time range)  polyethylene glycol (MIRALAX / GLYCOLAX) packet 17 g (has no administration in time range)  senna-docusate (Senokot-S) tablet 1 tablet (has no administration in time range)  ferrous sulfate tablet 325 mg (has no administration in time range)   iron sucrose (VENOFER) 300 mg in sodium chloride 0.9 % 250 mL IVPB (has no administration in time range)  pantoprazole (PROTONIX) 80 mg /NS 100 mL IVPB (0 mg Intravenous Stopped 04/30/22 1123)  morphine (PF) 4 MG/ML injection 4 mg (4 mg Intravenous Given 04/30/22 1116)  ondansetron (ZOFRAN) injection 4 mg (4 mg Intravenous Given 04/30/22 1116)  sodium chloride 0.9 % bolus 1,000 mL (0 mLs Intravenous Stopped 04/30/22 1409)     Consults:  GI consulted via telephone Hospitalist consulted for [facility]   EMR reviewed  Recent H&P and DC summary from 5/10 admission [facility] notes in [place] IMPRESSION(S) / ED DIAGNOSES   Final diagnoses:  UGIB (upper gastrointestinal bleed)     Rx / DC Orders   ED Discharge Orders     None        Note:  This document was prepared using Dragon voice recognition software and may include unintentional dictation errors.   Duffy Bruce, MD 04/30/22 [phone]

## 2022-04-30 NOTE — Assessment & Plan Note (Signed)
-  prn Xanax

## 2022-04-30 NOTE — Consult Note (Signed)
Cephas Darby, MD [address]  [place]  [place], [place] [address]  Main: [phone]  Fax: [phone] Pager: [phone]   Consultation  Referring Provider:     No ref. provider found Primary Care Physician:  Theotis Burrow, MD Primary Gastroenterologist:  Dr. Haig Prophet         Reason for Consultation:     Melena  Date of Admission:  04/30/2022 Date of Consultation:  04/30/2022         HPI:   Samuel Pennington is a 56 y.o. male history of diabetes, esophageal squamous cell carcinoma, managed by Dr. Grayland Ormond locally and recently established care with [facility] on 03/20/2022 s/p chemo and XRT, expected to have surgical intervention in June with [facility], history of hypertension, hyperlipidemia presents with recurrence of melena.  Patient reports that he has been having black stools since Friday.  Patient was previously admitted with melena in January 2023 as well as earlier this month.  Underwent upper endoscopy on 04/19/2022, found to have oozing gastric ulcer, injected and clip was placed.  He now presents with melena associated with abdominal pain, nausea.  During that admission his hemoglobin improved from 5.9 to 9.  He now presents with hemoglobin of 8.2, mild leukopenia, Platelets 177, normal PT/INR.  Patient is started on pantoprazole drip and GI is consulted for further evaluation.  Patient is currently n.p.o. patient has severe iron deficiency, taking oral iron as outpatient.  Patient reports that his last episode of melena was yesterday.  He is hungry and would like to eat something.   NSAIDs: None  Antiplts/Anticoagulants/Anti thrombotics: None  GI Procedures:  Upper endoscopy 04/19/2022 - Normal examined duodenum. - Malignant esophageal tumor was found in the middle third of the esophagus. - Oozing gastric ulcer with a clean ulcer base (Forrest Class III). Injected. Clip was placed. - Normal examined duodenum. - No specimens  collected.  Past Medical History:  Diagnosis Date   Cancer ([place]) 2022   Diabetes mellitus without complication ([place])    Hyperlipidemia    Neuromuscular disorder [facility])     Past Surgical History:  Procedure Laterality Date   COLONOSCOPY WITH PROPOFOL N/A 11/14/2021   Procedure: COLONOSCOPY WITH PROPOFOL;  Surgeon: Lesly Rubenstein, MD;  Location: [facility] ENDOSCOPY;  Service: Endoscopy;  Laterality: N/A;   ESOPHAGOGASTRODUODENOSCOPY (EGD) WITH PROPOFOL N/A 11/14/2021   Procedure: ESOPHAGOGASTRODUODENOSCOPY (EGD) WITH PROPOFOL;  Surgeon: Lesly Rubenstein, MD;  Location: [facility] ENDOSCOPY;  Service: Endoscopy;  Laterality: N/A;   ESOPHAGOGASTRODUODENOSCOPY (EGD) WITH PROPOFOL N/A 04/19/2022   Procedure: ESOPHAGOGASTRODUODENOSCOPY (EGD) WITH PROPOFOL;  Surgeon: Jonathon Bellows, MD;  Location: [facility] ENDOSCOPY;  Service: Gastroenterology;  Laterality: N/A;   EUS N/A 12/14/2021   Procedure: FULL UPPER ENDOSCOPIC ULTRASOUND (EUS) RADIAL;  Surgeon: Holly Bodily, MD;  Location: [facility] ENDOSCOPY;  Service: Gastroenterology;  Laterality: N/A;  SPANISH INTERPRETER    Current Facility-Administered Medications:    0.9 %  sodium chloride infusion, , Intravenous, Continuous, Ivor Costa, MD, Last Rate: 100 mL/hr at 04/30/22 1409, New Bag at 04/30/22 1409   acetaminophen (TYLENOL) tablet 650 mg, 650 mg, Oral, Q6H PRN, Ivor Costa, MD   ALPRAZolam Duanne Moron) tablet 0.25 mg, 0.25 mg, Oral, QHS PRN, Ivor Costa, MD   atorvastatin (LIPITOR) tablet 40 mg, 40 mg, Oral, QPM, Ivor Costa, MD   [START ON 05/01/2022] ferrous sulfate tablet 325 mg, 325 mg, Oral, Q breakfast, Ivor Costa, MD   iron sucrose (VENOFER) 300 mg in sodium chloride 0.9 %  250 mL IVPB, 300 mg, Intravenous, Once, Maude Gloor, Tally Due, MD   ondansetron [facility]) injection 4 mg, 4 mg, Intravenous, Q8H PRN, Ivor Costa, MD   oxyCODONE (Oxy IR/ROXICODONE) immediate release tablet 10 mg, 10 mg, Oral, Q6H PRN, Ivor Costa, MD   pantoprozole (PROTONIX) 80 mg /NS 100  mL infusion, 8 mg/hr, Intravenous, Continuous, Duffy Bruce, MD, Last Rate: 10 mL/hr at 04/30/22 1123, 8 mg/hr at 04/30/22 1123   polyethylene glycol (MIRALAX / GLYCOLAX) packet 17 g, 17 g, Oral, Daily PRN, Ivor Costa, MD   senna-docusate (Senokot-S) tablet 1 tablet, 1 tablet, Oral, QHS PRN, Ivor Costa, MD  Current Outpatient Medications:    ALPRAZolam (XANAX) 0.25 MG tablet, Take 1 tablet (0.25 mg total) by mouth at bedtime as needed for anxiety., Disp: 30 tablet, Rfl: 0   atorvastatin (LIPITOR) 40 MG tablet, TOMA UNA TABLETA POR BOCA AL ACOSTARSE PARA COLESTEROL ALTO, Disp: 90 tablet, Rfl: 3   ferrous sulfate 325 (65 FE) MG tablet, Take 1 tablet (325 mg total) by mouth daily with breakfast., Disp: 30 tablet, Rfl: 0   glipiZIDE (GLUCOTROL XL) 5 MG 24 hr tablet, TOME 1 TABLETA POR BOCA DIARIO PARA LA DIABETES, Disp: 90 tablet, Rfl: 1   metFORMIN (GLUCOPHAGE) 1000 MG tablet, Take 1,000 mg by mouth daily., Disp: , Rfl:    OLANZapine (ZYPREXA) 5 MG tablet, Take 1 tablet (5 mg total) by mouth at bedtime as needed (nausea)., Disp: 30 tablet, Rfl: 1   oxyCODONE (OXY IR/ROXICODONE) 5 MG immediate release tablet, Take 10 mg by mouth every 6 (six) hours as needed for pain., Disp: , Rfl:    pantoprazole (PROTONIX) 40 MG tablet, Take 1 tablet (40 mg total) by mouth 2 (two) times daily before a meal., Disp: 60 tablet, Rfl: 0   polyethylene glycol (MIRALAX / GLYCOLAX) 17 g packet, Take 17 g by mouth daily as needed for severe constipation., Disp: 14 each, Rfl: 0   senna-docusate (SENOKOT-S) 8.6-50 MG tablet, Take 1 tablet by mouth at bedtime as needed for mild constipation., Disp: 30 tablet, Rfl: 0   sucralfate (CARAFATE) 1 g tablet, Take 1 tablet (1 g total) by mouth 4 (four) times daily -  with meals and at bedtime. (Patient not taking: Reported on 04/18/2022), Disp: 60 tablet, Rfl: 0   Family History  Problem Relation Age of Onset   Hypertension Mother      Social History   Tobacco Use   Smoking  status: Never   Smokeless tobacco: Never  Vaping Use   Vaping Use: Never used  Substance Use Topics   Alcohol use: Yes    Comment: occassionallh   Drug use: Never    Allergies as of 04/30/2022   (No Known Allergies)    Review of Systems:    All systems reviewed and negative except where noted in HPI.   Physical Exam:  Vital signs in last 24 hours: Temp:  [97.9 F (36.6 C)] 97.9 F (36.6 C) (05/22 0834) Pulse Rate:  [57-85] 85 (05/22 1409) Resp:  [18-25] 25 (05/22 1409) BP: (95-124)/(52-71) 95/52 (05/22 1409) SpO2:  [98 %-100 %] 98 % (05/22 1409) Weight:  [68 kg] 68 kg (05/22 4709)   General:   Pleasant, cooperative in NAD Head:  Normocephalic and atraumatic. Eyes:   No icterus.   Conjunctiva pink. PERRLA. Ears:  Normal auditory acuity. Neck:  Supple; no masses or thyroidomegaly Lungs: Respirations even and unlabored. Lungs clear to auscultation bilaterally.   No wheezes, crackles, or rhonchi.  Heart:  Regular rate and rhythm;  Without murmur, clicks, rubs or gallops Abdomen:  Soft, nondistended, nontender. Normal bowel sounds. No appreciable masses or hepatomegaly.  No rebound or guarding.  Rectal:  Not performed. Msk:  Symmetrical without gross deformities.  Strength generalized weakness Extremities:  Without edema, cyanosis or clubbing. Neurologic:  Alert and oriented x3;  grossly normal neurologically. Skin:  Intact without significant lesions or rashes. Cervical Nodes:  No significant cervical adenopathy. Psych:  Alert and cooperative. Normal affect.  LAB RESULTS:    Latest Ref Rng & Units 04/30/2022    8:33 AM 04/21/2022    4:39 AM 04/20/2022    7:25 PM  CBC  WBC 4.0 - 10.5 K/uL 3.7   4.1   4.9    Hemoglobin 13.0 - 17.0 g/dL 8.2   8.4   9.0    Hematocrit 39.0 - 52.0 % 26.6   26.3   28.1    Platelets 150 - 400 K/uL 177   159   166      BMET    Latest Ref Rng & Units 04/30/2022    8:33 AM 04/21/2022    4:39 AM 04/20/2022    5:35 AM  BMP  Glucose 70 - 99  mg/dL 178   92   111    BUN 6 - 20 mg/dL '15   8   8    ' Creatinine 0.61 - 1.24 mg/dL 0.90   0.76   0.74    Sodium 135 - 145 mmol/L 138   144   141    Potassium 3.5 - 5.1 mmol/L 3.9   4.2   3.2    Chloride 98 - 111 mmol/L 103   113   112    CO2 22 - 32 mmol/L '29   26   24    ' Calcium 8.9 - 10.3 mg/dL 8.8   8.7   8.3      LFT    Latest Ref Rng & Units 04/30/2022    8:33 AM 04/18/2022   12:01 AM 03/15/2022   10:39 AM  Hepatic Function  Total Protein 6.5 - 8.1 g/dL 6.7   6.2   7.1    Albumin 3.5 - 5.0 g/dL 3.9   3.5   3.6    AST 15 - 41 U/L '17   21   16    ' ALT 0 - 44 U/L '16   16   17    ' Alk Phosphatase 38 - 126 U/L 61   58   67    Total Bilirubin 0.3 - 1.2 mg/dL 0.7   0.4   0.2       STUDIES: No results found.    Impression / Plan:   Castor Gittleman is a 56 y.o. 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  He also has history of gastric ulcer that was treated with epi and clip on Apr 19, 2022.   Melena with severe iron deficiency anemia: Likely bleeding from esophageal cancer or recurrence of bleeding from gastric ulcer Mildly elevated BUN/creatinine Hemoglobin has slightly dropped from baseline Monitor CBC closely and maintain hemoglobin above 7 Maintain 2 large-bore IVs Agree with pantoprazole drip If the bleeding source is from esophageal cancer, will attempt Hemospray as hemostatic options are limited.  If the bleeding is from gastric ulcer, will attempt other modalities  +/- Hemospray Recommend IV iron Okay with clear liquid  diet, n.p.o. effective 5 AM tomorrow for upper endoscopy  I have discussed alternative options, risks & benefits,  which include, but are not limited to, bleeding, infection, perforation,respiratory complication & drug reaction.  The patient agrees with this plan & written consent will be obtained.      Thank you for  involving me in the care of this patient.  GI will follow along with you    LOS: 0 days   Sherri Sear, MD  04/30/2022, 6:03 PM    Note: This dictation was prepared with Dragon dictation along with smaller phrase technology. Any transcriptional errors that result from this process are unintentional.

## 2022-04-30 NOTE — ED Triage Notes (Signed)
Pt here with rectal bleeding. Pt states he was here recently for same. Pt states that his stools are still black and he is still having abd pain. Pt states nausea and back pain that lasted a few hours.

## 2022-04-30 NOTE — ED Notes (Signed)
Provider made aware of BP.

## 2022-04-30 NOTE — Assessment & Plan Note (Signed)
-  see above 

## 2022-04-30 NOTE — ED Notes (Signed)
Morton Amy, NP at bedside.

## 2022-04-30 NOTE — Assessment & Plan Note (Signed)
-  see above -Continue iron supplement

## 2022-04-30 NOTE — Assessment & Plan Note (Signed)
Lipitor 

## 2022-04-30 NOTE — Assessment & Plan Note (Signed)
GI bleeding, acute blood loss anemia, iron deficiency anemia: Possibly due to upper GI bleeding secondary to gastric ulcer.  Hemoglobin 9.0 --> 8.2.  Patient is slightly lightheaded.  Hemodynamically stable. Consulted Dr. Vicente Males of GI - will admitted to med-surg bed as inpatient - NPO for possible EGD - IVF: 1L NS bolus, then at 100 mL/hr - Start IV pantoprazole gtt - Zofran IV for nausea - Avoid NSAIDs and SQ heparin - Maintain IV access (2 large bore IVs if possible). - Monitor closely and follow q6h cbc, transfuse as necessary, if Hgb<7.0 - LaB: INR, PTT and type screen

## 2022-04-30 NOTE — Assessment & Plan Note (Signed)
The patient completed 6 cycles of carboplatin and Taxol in February 2023 and XRT on 02/09/2022 and is expected to have GI surgery at [facility] later this month. Pt states that he has an appointment on 06/01/2022 in [place]

## 2022-04-30 NOTE — Assessment & Plan Note (Signed)
-   On Protonix drip. 

## 2022-05-01 ENCOUNTER — Inpatient Hospital Stay: Payer: Self-pay | Admitting: Anesthesiology

## 2022-05-01 ENCOUNTER — Encounter
Admission: EM | Disposition: A | Discharge status: Home / Self Care | Payer: Self-pay | Source: Home / Self Care | Attending: Internal Medicine

## 2022-05-01 ENCOUNTER — Encounter: Payer: Self-pay | Admitting: Internal Medicine

## 2022-05-01 DIAGNOSIS — C159 Malignant neoplasm of esophagus, unspecified: Secondary | ICD-10-CM

## 2022-05-01 HISTORY — PX: ESOPHAGOGASTRODUODENOSCOPY (EGD) WITH PROPOFOL: SHX5813

## 2022-05-01 LAB — CBC
HCT: 23.4 % — ABNORMAL LOW (ref 39.0–52.0)
HCT: 28.3 % — ABNORMAL LOW (ref 39.0–52.0)
HCT: 28.5 % — ABNORMAL LOW (ref 39.0–52.0)
HCT: 28.9 % — ABNORMAL LOW (ref 39.0–52.0)
HCT: 30.4 % — ABNORMAL LOW (ref 39.0–52.0)
HCT: 31.1 % — ABNORMAL LOW (ref 39.0–52.0)
Hemoglobin: 7.3 g/dL — ABNORMAL LOW (ref 13.0–17.0)
Hemoglobin: 9 g/dL — ABNORMAL LOW (ref 13.0–17.0)
Hemoglobin: 9.1 g/dL — ABNORMAL LOW (ref 13.0–17.0)
Hemoglobin: 9.1 g/dL — ABNORMAL LOW (ref 13.0–17.0)
Hemoglobin: 9.6 g/dL — ABNORMAL LOW (ref 13.0–17.0)
Hemoglobin: 9.9 g/dL — ABNORMAL LOW (ref 13.0–17.0)
MCH: 30.6 pg (ref 26.0–34.0)
MCH: 30.9 pg (ref 26.0–34.0)
MCH: 31 pg (ref 26.0–34.0)
MCH: 31.1 pg (ref 26.0–34.0)
MCH: 31.2 pg (ref 26.0–34.0)
MCH: 31.5 pg (ref 26.0–34.0)
MCHC: 31.2 g/dL (ref 30.0–36.0)
MCHC: 31.5 g/dL (ref 30.0–36.0)
MCHC: 31.6 g/dL (ref 30.0–36.0)
MCHC: 31.8 g/dL (ref 30.0–36.0)
MCHC: 31.8 g/dL (ref 30.0–36.0)
MCHC: 31.9 g/dL (ref 30.0–36.0)
MCV: 97.3 fL (ref 80.0–100.0)
MCV: 97.6 fL (ref 80.0–100.0)
MCV: 97.8 fL (ref 80.0–100.0)
MCV: 98.1 fL (ref 80.0–100.0)
MCV: 99 fL (ref 80.0–100.0)
MCV: 99.2 fL (ref 80.0–100.0)
Platelets: 137 10*3/uL — ABNORMAL LOW (ref 150–400)
Platelets: 144 10*3/uL — ABNORMAL LOW (ref 150–400)
Platelets: 150 10*3/uL (ref 150–400)
Platelets: 154 10*3/uL (ref 150–400)
Platelets: 162 10*3/uL (ref 150–400)
Platelets: 164 10*3/uL (ref 150–400)
RBC: 2.36 MIL/uL — ABNORMAL LOW (ref 4.22–5.81)
RBC: 2.86 MIL/uL — ABNORMAL LOW (ref 4.22–5.81)
RBC: 2.92 MIL/uL — ABNORMAL LOW (ref 4.22–5.81)
RBC: 2.97 MIL/uL — ABNORMAL LOW (ref 4.22–5.81)
RBC: 3.1 MIL/uL — ABNORMAL LOW (ref 4.22–5.81)
RBC: 3.18 MIL/uL — ABNORMAL LOW (ref 4.22–5.81)
RDW: 19.3 % — ABNORMAL HIGH (ref 11.5–15.5)
RDW: 19.4 % — ABNORMAL HIGH (ref 11.5–15.5)
RDW: 19.5 % — ABNORMAL HIGH (ref 11.5–15.5)
RDW: 19.6 % — ABNORMAL HIGH (ref 11.5–15.5)
RDW: 19.6 % — ABNORMAL HIGH (ref 11.5–15.5)
RDW: 20.2 % — ABNORMAL HIGH (ref 11.5–15.5)
WBC: 2.8 10*3/uL — ABNORMAL LOW (ref 4.0–10.5)
WBC: 3.9 10*3/uL — ABNORMAL LOW (ref 4.0–10.5)
WBC: 4 10*3/uL (ref 4.0–10.5)
WBC: 4.4 10*3/uL (ref 4.0–10.5)
WBC: 6 10*3/uL (ref 4.0–10.5)
WBC: 6.4 10*3/uL (ref 4.0–10.5)
nRBC: 0 % (ref 0.0–0.2)
nRBC: 0 % (ref 0.0–0.2)
nRBC: 0 % (ref 0.0–0.2)
nRBC: 0 % (ref 0.0–0.2)
nRBC: 0 % (ref 0.0–0.2)
nRBC: 0 % (ref 0.0–0.2)

## 2022-05-01 LAB — GLUCOSE, CAPILLARY: Glucose-Capillary: 118 mg/dL — ABNORMAL HIGH (ref 70–99)

## 2022-05-01 LAB — PREPARE RBC (CROSSMATCH)

## 2022-05-01 SURGERY — ESOPHAGOGASTRODUODENOSCOPY (EGD) WITH PROPOFOL
Anesthesia: General

## 2022-05-01 MED ORDER — SODIUM CHLORIDE 0.9% IV SOLUTION
Freq: Once | INTRAVENOUS | Status: AC
  Filled 2022-05-01: qty 250

## 2022-05-01 MED ORDER — SODIUM CHLORIDE (PF) 0.9 % IJ SOLN
PREFILLED_SYRINGE | INTRAMUSCULAR | Status: DC | PRN
  Administered 2022-05-01: 10 mL

## 2022-05-01 MED ORDER — NOREPINEPHRINE 4 MG/250ML-% IV SOLN: 2.0000 ug/min | INTRAVENOUS | Status: DC

## 2022-05-01 MED ORDER — SODIUM CHLORIDE 0.9 % IV SOLN: 250.0000 mL | INTRAVENOUS | Status: DC

## 2022-05-01 MED ORDER — LIDOCAINE HCL (CARDIAC) PF 100 MG/5ML IV SOSY
PREFILLED_SYRINGE | INTRAVENOUS | Status: DC | PRN
  Administered 2022-05-01: 60 mg via INTRAVENOUS

## 2022-05-01 MED ORDER — EPINEPHRINE 1 MG/10ML IJ SOSY
PREFILLED_SYRINGE | INTRAMUSCULAR | Status: AC
  Filled 2022-05-01: qty 10

## 2022-05-01 MED ORDER — SODIUM CHLORIDE 0.9 % IV SOLN
INTRAVENOUS | Status: DC
  Administered 2022-05-01: 1000 mL via INTRAVENOUS

## 2022-05-01 MED ORDER — PROPOFOL 10 MG/ML IV BOLUS
INTRAVENOUS | Status: DC | PRN
  Administered 2022-05-01 (×2): 10 mg via INTRAVENOUS
  Administered 2022-05-01: 20 mg via INTRAVENOUS
  Administered 2022-05-01 (×2): 10 mg via INTRAVENOUS
  Administered 2022-05-01: 70 mg via INTRAVENOUS
  Administered 2022-05-01 (×2): 20 mg via INTRAVENOUS
  Administered 2022-05-01: 30 mg via INTRAVENOUS
  Administered 2022-05-01: 20 mg via INTRAVENOUS

## 2022-05-01 MED ORDER — PHENYLEPHRINE HCL (PRESSORS) 10 MG/ML IV SOLN
INTRAVENOUS | Status: DC | PRN
  Administered 2022-05-01: 80 ug via INTRAVENOUS

## 2022-05-01 NOTE — Progress Notes (Signed)
Admission profile updated. ?

## 2022-05-01 NOTE — Op Note (Signed)
[facility] Gastroenterology Patient Name: Samuel Pennington Procedure Date: 05/01/2022 12:35 PM MRN: [id number] Account #: [id number] Date of Birth: June 07, 1966 Admit Type: Inpatient Age: 56 Room: [facility] ENDO ROOM 3 Gender: Male Note Status: Finalized Instrument Name: Upper Endoscope [id number] Procedure:             Upper GI endoscopy Indications:           Iron deficiency anemia secondary to chronic blood                         loss, Iron deficiency anemia due to suspected upper                         gastrointestinal bleeding, Melena, Malignant                         esophageal squamous cell carcinoma Providers:             Lin Landsman MD, MD Referring MD:          Elyse Jarvis Revelo (Referring MD) Medicines:             General Anesthesia Complications:         No immediate complications. Estimated blood loss:                         Minimal. Procedure:             Pre-Anesthesia Assessment:                        - Prior to the procedure, a History and Physical was                         performed, and patient medications and allergies were                         reviewed. The patient is competent. The risks and                         benefits of the procedure and the sedation options and                         risks were discussed with the patient. All questions                         were answered and informed consent was obtained.                         Patient identification and proposed procedure were                         verified by the physician, the nurse, the                         anesthesiologist, the anesthetist and the technician                         in the pre-procedure area in the procedure room in the  endoscopy suite. Mental Status Examination: alert and                         oriented. Airway Examination: normal oropharyngeal                         airway and neck mobility. Respiratory  Examination:                         clear to auscultation. CV Examination: normal.                         Prophylactic Antibiotics: The patient does not require                         prophylactic antibiotics. Prior Anticoagulants: The                         patient has taken no previous anticoagulant or                         antiplatelet agents. ASA Grade Assessment: III - A                         patient with severe systemic disease. After reviewing                         the risks and benefits, the patient was deemed in                         satisfactory condition to undergo the procedure. The                         anesthesia plan was to use general anesthesia.                         Immediately prior to administration of medications,                         the patient was re-assessed for adequacy to receive                         sedatives. The heart rate, respiratory rate, oxygen                         saturations, blood pressure, adequacy of pulmonary                         ventilation, and response to care were monitored                         throughout the procedure. The physical status of the                         patient was re-assessed after the procedure.                        After obtaining informed consent, the endoscope was  passed under direct vision. Throughout the procedure,                         the patient's blood pressure, pulse, and oxygen                         saturations were monitored continuously. The Endoscope                         was introduced through the mouth, and advanced to the                         second part of duodenum. The upper GI endoscopy was                         accomplished without difficulty. The patient tolerated                         the procedure well. Findings:      A single 10 mm sessile polypoid lession with no bleeding was found in       the second portion of the duodenum.  Biopsies were taken with a cold       forceps for histology.      One non-obstructing, non healing, persistent oozing cratered malignant       gastric ulcer with oozing hemorrhage (Forrest Class Ib) was found in the       cardia. The lesion was 15 mm in largest dimension. There is no evidence       of perforation. Coagulation for hemostasis using bipolar probe was       partially successful. Area was successfully injected with 6 mL of a       1:10,000 solution of epinephrine for hemostasis.      The gastric body, incisura and gastric antrum were normal. Biopsies were       taken with a cold forceps for histology.      A medium-sized, submucosal mass with no bleeding and no stigmata of       recent bleeding was found in the lower third of the esophagus. The mass       was non-obstructing and not circumferential, known malignant tumor. Impression:            - A single duodenal polyp. Biopsied.                        - Non-obstructing oozing gastric ulcer with oozing                         hemorrhage (Forrest Class Ib). There is no evidence of                         perforation. Treated with bipolar cautery. Injected.                        - Normal gastric body, incisura and antrum. Biopsied.                        - Malignant esophageal tumor was found in the lower  third of the esophagus. Recommendation:        - Await pathology results.                        - Return patient to hospital ward for ongoing care.                        - Resume regular diet today.                        - Continue present medications.                        - Observe patient's clinical course following today's                         procedure with therapeutic intervention.                        - Use Prilosec (omeprazole) 40 mg PO BID long term. Procedure Code(s):     --- Professional ---                        [phone], 63, Esophagogastroduodenoscopy, flexible,                          transoral; with control of bleeding, any method                        43239, Esophagogastroduodenoscopy, flexible,                         transoral; with biopsy, single or multiple Diagnosis Code(s):     --- Professional ---                        K31.7, Polyp of stomach and duodenum                        K25.4, Chronic or unspecified gastric ulcer with                         hemorrhage                        C15.5, Malignant neoplasm of lower third of esophagus                        D50.0, Iron deficiency anemia secondary to blood loss                         (chronic)                        D50.9, Iron deficiency anemia, unspecified                        K92.1, Melena (includes Hematochezia) CPT copyright 2019 American Medical Association. All rights reserved. The codes documented in this report are preliminary and upon coder review may  be revised to meet current compliance requirements. Dr. Ulyess Mort Lin Landsman MD, MD 05/01/2022 1:18:29 PM This report has been signed electronically.  Number of Addenda: 0 Note Initiated On: 05/01/2022 12:35 PM Estimated Blood Loss:  Estimated blood loss was minimal.      [facility]

## 2022-05-01 NOTE — ED Notes (Signed)
Assumed care of patient at this time. Patient AOX4. Resp even, unlabored on RA. Reports very mild abdominal pain. Denies any bloody BM's while in ED. Vascular access RN at bedside at this time.

## 2022-05-01 NOTE — Progress Notes (Addendum)
       CROSS COVER NOTE  NAME: Aedin Jeansonne MRN: [id number] DOB : 1966-05-22    Date of Service   04/30/22  HPI/Events of Note   Secure chat received from nursing reporting "this patient's Bps are getting pretty soft. Last I have is 84/45. Would you like to up his ml/hr for his fluids?"  Clinically Mr Baetz is alert and oriented, has warm extremities, strong pulses, and is not pale on exam. He denies dyspnea or palpitations.  2330: BP 77/46 MAP 57  Interventions   Plan: 1L bolus 1U PRBC (HGB 7.3 down from 9.4 ~4 hrs prior, could be dilutional but considering BP will transfuse 1U)         Alesia Banda, MHA, FNP-BC Nurse Practitioner [facility] Pager [phone]

## 2022-05-01 NOTE — Transfer of Care (Signed)
Immediate Anesthesia Transfer of Care Note  Patient: Samuel Pennington  Procedure(s) Performed: ESOPHAGOGASTRODUODENOSCOPY (EGD) WITH PROPOFOL  Patient Location: PACU  Anesthesia Type:General  Level of Consciousness: awake, alert  and sedated  Airway & Oxygen Therapy: Patient Spontanous Breathing  Post-op Assessment: Report given to RN and Post -op Vital signs reviewed and stable  Post vital signs: Reviewed and stable  Last Vitals:  Vitals Value Taken Time  BP    Temp    Pulse    Resp    SpO2      Last Pain:  Vitals:   05/01/22 1229  TempSrc: Temporal  PainSc: 3       Patients Stated Pain Goal: 0 (12/92/90 9030)  Complications: No notable events documented.

## 2022-05-01 NOTE — TOC Initial Note (Signed)
Transition of Care [facility]) - Initial/Assessment Note    Patient Details  Name: Samuel Pennington MRN: [id number] Date of Birth: [birth year]  Transition of Care [facility]) CM/SW Contact:    Candie Chroman, LCSW Phone Number: 05/01/2022, 12:20 PM  Clinical Narrative:   Readmission prevention screen complete. Called patient in the room using interpreter, introduced role, and explained that discharge planning would be discussed. PCP is Elyse Jarvis, MD at [facility]. Patient drives himself to appointments. He uses the pharmacy at his PCP office. Only issues are delay in having the medications or not having them in stock. No home health or DME use prior to admission. Patient's car is here so he will likely transport himself home but if he is unable he will call his brother. No further concerns. CSW encouraged patient to contact CSW as needed. CSW will continue to follow patient for support and facilitate return home once stable.               Expected Discharge Plan: Home/Self Care Barriers to Discharge: Continued Medical Work up   Patient Goals and CMS Choice        Expected Discharge Plan and Services Expected Discharge Plan: Home/Self Care     Post Acute Care Choice: NA Living arrangements for the past 2 months: Apartment                                      Prior Living Arrangements/Services Living arrangements for the past 2 months: Apartment   Patient language and need for interpreter reviewed:: Yes (Spanish. Quillian Quince #[id number].) Do you feel safe going back to the place where you live?: Yes            Criminal Activity/Legal Involvement Pertinent to Current Situation/Hospitalization: No - Comment as needed  Activities of Daily Living Home Assistive Devices/Equipment: CBG Meter ADL Screening (condition at time of admission) Patient's cognitive ability adequate to safely complete daily activities?: Yes Is the patient deaf or have difficulty  hearing?: No Does the patient have difficulty seeing, even when wearing glasses/contacts?: No Does the patient have difficulty concentrating, remembering, or making decisions?: No Patient able to express need for assistance with ADLs?: Yes Does the patient have difficulty dressing or bathing?: No Independently performs ADLs?: Yes (appropriate for developmental age) Does the patient have difficulty walking or climbing stairs?: No Weakness of Legs: None Weakness of Arms/Hands: None  Permission Sought/Granted                  Emotional Assessment Appearance:: Appears stated age Attitude/Demeanor/Rapport: Engaged, Gracious Affect (typically observed): Accepting, Appropriate, Calm, Pleasant Orientation: : Oriented to Self, Oriented to Place, Oriented to  Time, Oriented to Situation Alcohol / Substance Use: Not Applicable Psych Involvement: No (comment)  Admission diagnosis:  GI bleeding [K92.2] UGIB (upper gastrointestinal bleed) [K92.2] Patient Active Problem List   Diagnosis Date Noted   Anxiety 04/30/2022   GERD without esophagitis 04/18/2022   Symptomatic anemia 12/19/2021   Upper GI bleed 12/18/2021   Squamous cell esophageal cancer (HCC)    Esophageal mass    GIB (gastrointestinal bleeding) 11/12/2021   Iron deficiency anemia    GI bleeding 11/11/2021   Blurry vision 11/11/2021   Diabetes mellitus without complication (HCC)    Hyperlipidemia    Chest pain    Acute blood loss anemia    Diabetic ulcer of right foot associated with type 2  diabetes mellitus ([place]) 05/13/2015   Ulcer of right heel and midfoot, limited to breakdown of skin ([place]) 05/13/2015   PCP:  Theotis Burrow, MD Pharmacy:   [place], [place] [address] [place] [address] Phone: [phone] Fax: [phone]  [facility] [address]), [place] [employer]) [place]  [address] Phone: [phone] Fax: [phone]     Social Determinants of Health ([place]) Interventions    Readmission Risk Interventions    05/01/2022   12:17 PM  Readmission Risk Prevention Plan  Transportation Screening Complete  PCP or Specialist Appt within 3-5 Days Complete  Social Work Consult for [place] Planning/Counseling Complete  Palliative Care Screening Not Applicable  Medication Review Press photographer) Complete

## 2022-05-01 NOTE — ED Notes (Signed)
Informed RN bed assigned 

## 2022-05-01 NOTE — Progress Notes (Signed)
An USGPIV (ultrasound guided PIV) has been placed for short-term vasopressor infusion. A correctly placed ivWatch must be used when administering Vasopressors. Should this treatment be needed beyond 72 hours, central line access should be obtained.  It will be the responsibility of the bedside nurse to follow best practice to prevent extravasations.   ?

## 2022-05-01 NOTE — ED Notes (Signed)
Lilian Kapur RN IP has provided verbal approval to send patient at this time.

## 2022-05-01 NOTE — Plan of Care (Signed)
Pt arrived to unit AAOx4, mild lower abdominal pain. VS are stable. Plan for endoscopy today. Bed is in lowest position, call light within reach. Will continue to monitor.

## 2022-05-01 NOTE — ED Notes (Signed)
Patient resting in bed with eyes closed. Awakens easily to verbal stimuli. PRBC unit completed without signs or symptoms of transfusion reaction. No distress noted at this time.

## 2022-05-01 NOTE — ED Notes (Signed)
Patient resting in bed with eyes closed. Resp even, unlabored on RA. SB on monitor at 50's. PRBC's infusing without signs or symptoms of transfusion reaction. No distress noted at this time.

## 2022-05-01 NOTE — Anesthesia Preprocedure Evaluation (Signed)
Anesthesia Evaluation  Patient identified by MRN, date of birth, ID band Patient awake    Reviewed: Allergy & Precautions, NPO status , Patient's Chart, lab work & pertinent test results  History of Anesthesia Complications Negative for: history of anesthetic complications  Airway Mallampati: III  TM Distance: <3 FB Neck ROM: full    Dental  (+) Chipped, Poor Dentition, Missing   Pulmonary neg pulmonary ROS, neg shortness of breath,    Pulmonary exam normal        Cardiovascular Exercise Tolerance: Good (-) angina(-) CAD and (-) DOE negative cardio ROS Normal cardiovascular exam     Neuro/Psych  Neuromuscular disease negative psych ROS   GI/Hepatic Neg liver ROS, GERD  Controlled,  Endo/Other  diabetes, Type 2  Renal/GU negative Renal ROS  negative genitourinary   Musculoskeletal   Abdominal   Peds  Hematology negative hematology ROS (+)   Anesthesia Other Findings Past Medical History: 2022: Cancer ([place]) No date: Diabetes mellitus without complication ([place]) No date: Hyperlipidemia No date: Neuromuscular disorder [facility])  Past Surgical History: 11/14/2021: COLONOSCOPY WITH PROPOFOL; N/A     Comment:  Procedure: COLONOSCOPY WITH PROPOFOL;  Surgeon:               Lesly Rubenstein, MD;  Location: [facility] ENDOSCOPY;                Service: Endoscopy;  Laterality: N/A; 11/14/2021: ESOPHAGOGASTRODUODENOSCOPY (EGD) WITH PROPOFOL; N/A     Comment:  Procedure: ESOPHAGOGASTRODUODENOSCOPY (EGD) WITH               PROPOFOL;  Surgeon: Lesly Rubenstein, MD;  Location:               [facility] ENDOSCOPY;  Service: Endoscopy;  Laterality: N/A; 04/19/2022: ESOPHAGOGASTRODUODENOSCOPY (EGD) WITH PROPOFOL; N/A     Comment:  Procedure: ESOPHAGOGASTRODUODENOSCOPY (EGD) WITH               PROPOFOL;  Surgeon: Jonathon Bellows, MD;  Location: [facility]               ENDOSCOPY;  Service: Gastroenterology;  Laterality: N/A; 12/14/2021: EUS; N/A      Comment:  Procedure: FULL UPPER ENDOSCOPIC ULTRASOUND (EUS)               RADIAL;  Surgeon: Holly Bodily, MD;  Location:               [facility] ENDOSCOPY;  Service: Gastroenterology;  Laterality:               N/A;  SPANISH INTERPRETER  BMI    Body Mass Index: 24.13 kg/m      Reproductive/Obstetrics negative OB ROS                             Anesthesia Physical Anesthesia Plan  ASA: 3  Anesthesia Plan: General   Post-op Pain Management:    Induction: Intravenous  PONV Risk Score and Plan: Propofol infusion and TIVA  Airway Management Planned: Natural Airway and Nasal Cannula  Additional Equipment:   Intra-op Plan:   Post-operative Plan:   Informed Consent: I have reviewed the patients History and Physical, chart, labs and discussed the procedure including the risks, benefits and alternatives for the proposed anesthesia with the patient or authorized representative who has indicated his/her understanding and acceptance.     Dental Advisory Given and Interpreter used for interveiw  Plan Discussed with: Anesthesiologist, CRNA and Surgeon  Anesthesia Plan Comments: (Patient consented for risks of anesthesia including but not limited to:  - adverse reactions to medications - risk of airway placement if required - damage to eyes, teeth, lips or other oral mucosa - nerve damage due to positioning  - sore throat or hoarseness - Damage to heart, brain, nerves, lungs, other parts of body or loss of life  Patient voiced understanding.)        Anesthesia Quick Evaluation

## 2022-05-01 NOTE — Progress Notes (Signed)
PROGRESS NOTE    Samuel Pennington  KDT:[id number] DOB: 09-09-1966 DOA: 04/30/2022 PCP: Theotis Burrow, MD    Brief Narrative:   56 y.o. male with medical history significant of esophageal adenocarcinoma (s/p of chemo and RXT, expected to have surgical intervention in June at [facility]), hypertension, hyperlipidemia, GERD, anxiety, GI bleeding, iron deficiency anemia, who presents with rectal bleeding.   Patient was recently hospitalized from 5/10 - 5/13 due to GI bleeding.  Patient had EGD done, which showed large gastric ulcer which is clipped. Patient was treated with Protonix drip. His hemoglobin down to 5.9 on 04/18/2022, which improved to 9.0 on 04/20/2022 after blood transfusion.  Patient had mild epigastric discomfort yesterday, which has resolved.  Currently no nausea, vomiting, abdominal pain or diarrhea.  Patient does not have chest pain, shortness breath, cough.  Status post EGD 5/23.  Gastroenterology elects to monitor patient overnight for rebleeding.  Assessment & Plan:   Principal Problem:   GI bleeding Active Problems:   Acute blood loss anemia   Iron deficiency anemia   Squamous cell esophageal cancer (HCC)   Diabetes mellitus without complication (HCC)   Hyperlipidemia   GERD without esophagitis   Anxiety  GI bleeding, acute blood loss anemia, iron deficiency anemia  Possibly due to upper GI bleeding secondary to gastric ulcer.   Hemoglobin 9.0 --> 8.2.  Patient is slightly lightheaded.   Hemodynamically stable. Consulted Dr. Vicente Males of GI Received 1 unit PRBC Received IV iron Status post EGD 5/23 Plan: Continue IV Protonix Monitor overnight for bleeding Soft diet today GI following Transfuse as needed less than 7  Acute blood loss anemia -see above   Iron deficiency anemia -see above -Continue iron supplement   Squamous cell esophageal cancer ([place]) The patient completed 6 cycles of carboplatin and Taxol in February 2023 and XRT on  02/09/2022 and is expected to have GI surgery at [facility] later this month. Pt states that he has an appointment on 06/01/2022 in [place] -prn Xanax   GERD without esophagitis -IV PPI as above   Hyperlipidemia - Lipitor   Diabetes mellitus without complication (HCC) Recent A1c 6.5, well controlled.  Patient is taking metformin and glipizide at home Oral agents on hold -Sliding scale insulin  DVT prophylaxis: SCD Code Status: Full Family Communication: None today Disposition Plan: Status is: Inpatient Remains inpatient appropriate because: Upper GI bleed status post endoscopy.  Will need monitoring overnight for rebleeding.  GI following.  On IV PPI.  Possible discharge 5/24.   Level of care: Med-Surg  Consultants:  GI  Procedures:  EGD 5/23  Antimicrobials: None   Subjective: Patient seen and examined.  History conducted in [place].  Feels well overall.  No apparent distress.  Complains of some lower abdominal pain otherwise stable.  Objective: Vitals:   05/01/22 1045 05/01/22 1100 05/01/22 1115 05/01/22 1136  BP: (!) 98/48 (!) 98/53 (!) 100/54 (!) 97/58  Pulse: 65 73 71 (!) 59  Resp: '12 13 15 18  '$ Temp:    98.6 F (37 C)  TempSrc:      SpO2: 98% 97% 100% 100%  Weight:      Height:        Intake/Output Summary (Last 24 hours) at 05/01/2022 1224 Last data filed at 05/01/2022 0715 Gross per 24 hour  Intake 3623.38 ml  Output --  Net 3623.38 ml   Filed Weights   04/30/22 0833  Weight: 68 kg    Examination:  General exam: Appears calm and comfortable  Respiratory system: Clear to auscultation. Respiratory effort normal. Cardiovascular system: S1-S2, RRR, no murmurs, no pedal edema Gastrointestinal system: Soft, nondistended, tender to palpation lower abdomen, normal bowel sounds Central nervous system: Alert and oriented. No focal neurological deficits. Extremities: Symmetric 5 x 5 power. Skin: No rashes, lesions or ulcers Psychiatry:  Judgement and insight appear normal. Mood & affect appropriate.     Data Reviewed: I have personally reviewed following labs and imaging studies  CBC: Recent Labs  Lab 04/30/22 1915 04/30/22 2347 05/01/22 0512 05/01/22 0942 05/01/22 1149  WBC 3.7* 2.8* 4.4 3.9* 4.0  HGB 9.4* 7.3* 9.0* 9.1* 9.6*  HCT 30.8* 23.4* 28.3* 28.9* 30.4*  MCV 101.7* 99.2 99.0 97.3 98.1  PLT 146* 137* 144* 150 086   Basic Metabolic Panel: Recent Labs  Lab 04/30/22 0833  NA 138  K 3.9  CL 103  CO2 29  GLUCOSE 178*  BUN 15  CREATININE 0.90  CALCIUM 8.8*   GFR: Estimated Creatinine Clearance: 75.8 mL/min (by C-G formula based on SCr of 0.9 mg/dL). Liver Function Tests: Recent Labs  Lab 04/30/22 0833  AST 17  ALT 16  ALKPHOS 61  BILITOT 0.7  PROT 6.7  ALBUMIN 3.9   No results for input(s): LIPASE, AMYLASE in the last 168 hours. No results for input(s): AMMONIA in the last 168 hours. Coagulation Profile: Recent Labs  Lab 04/30/22 0833  INR 1.0   Cardiac Enzymes: No results for input(s): CKTOTAL, CKMB, CKMBINDEX, TROPONINI in the last 168 hours. BNP (last 3 results) No results for input(s): PROBNP in the last 8760 hours. HbA1C: No results for input(s): HGBA1C in the last 72 hours. CBG: No results for input(s): GLUCAP in the last 168 hours. Lipid Profile: No results for input(s): CHOL, HDL, LDLCALC, TRIG, CHOLHDL, LDLDIRECT in the last 72 hours. Thyroid Function Tests: No results for input(s): TSH, T4TOTAL, FREET4, T3FREE, THYROIDAB in the last 72 hours. Anemia Panel: No results for input(s): VITAMINB12, FOLATE, FERRITIN, TIBC, IRON, RETICCTPCT in the last 72 hours. Sepsis Labs: No results for input(s): PROCALCITON, LATICACIDVEN in the last 168 hours.  No results found for this or any previous visit (from the past 240 hour(s)).       Radiology Studies: No results found.      Scheduled Meds:  atorvastatin  40 mg Oral QPM   ferrous sulfate  325 mg Oral Q breakfast    Continuous Infusions:  sodium chloride Stopped (05/01/22 0715)   pantoprazole 8 mg/hr (05/01/22 0812)     LOS: 1 day     Sidney Ace, MD [facility]   If 7PM-7AM, please contact night-coverage  05/01/2022, 12:24 PM

## 2022-05-01 NOTE — ED Notes (Signed)
RN to bedside to introduce self to pt. Pt asking for pain medication

## 2022-05-02 ENCOUNTER — Encounter: Payer: Self-pay | Admitting: Gastroenterology

## 2022-05-02 DIAGNOSIS — E782 Mixed hyperlipidemia: Secondary | ICD-10-CM

## 2022-05-02 DIAGNOSIS — D508 Other iron deficiency anemias: Secondary | ICD-10-CM

## 2022-05-02 LAB — CBC
HCT: 31.1 % — ABNORMAL LOW (ref 39.0–52.0)
Hemoglobin: 9.9 g/dL — ABNORMAL LOW (ref 13.0–17.0)
MCH: 30.8 pg (ref 26.0–34.0)
MCHC: 31.8 g/dL (ref 30.0–36.0)
MCV: 96.9 fL (ref 80.0–100.0)
Platelets: 175 10*3/uL (ref 150–400)
RBC: 3.21 MIL/uL — ABNORMAL LOW (ref 4.22–5.81)
RDW: 19.6 % — ABNORMAL HIGH (ref 11.5–15.5)
WBC: 5 10*3/uL (ref 4.0–10.5)
nRBC: 0 % (ref 0.0–0.2)

## 2022-05-02 LAB — SURGICAL PATHOLOGY

## 2022-05-02 LAB — TYPE AND SCREEN
ABO/RH(D): A POS
Antibody Screen: NEGATIVE
Unit division: 0

## 2022-05-02 LAB — BPAM RBC
Blood Product Expiration Date: 202306142359
ISSUE DATE / TIME: 202305230107
Unit Type and Rh: 6200

## 2022-05-02 MED ORDER — SODIUM CHLORIDE 0.9 % IV SOLN
150.0000 mg | Freq: Once | INTRAVENOUS | Status: AC
  Administered 2022-05-02: 150 mg via INTRAVENOUS
  Filled 2022-05-02: qty 7.5

## 2022-05-02 MED ORDER — ACETAMINOPHEN 325 MG PO TABS: 650.0000 mg | ORAL_TABLET | Freq: Four times a day (QID) | ORAL | Status: AC | PRN

## 2022-05-02 NOTE — Progress Notes (Incomplete)
PROGRESS NOTE  Samuel Pennington    DOB: June 05, 1966, 56 y.o.  PPJ:[id number]    Code Status: Full Code   DOA: 04/30/2022   LOS: 2   Brief hospital course  Samuel Pennington is a 56 y.o. male with a PMH significant for ***.  They presented from *** to the ED on 04/30/2022 with *** x *** days. ***  In the ED, it was found that they had ***.  Significant findings included ***.  They were initially treated with ***.   Patient was admitted to medicine service for further workup and management of *** as outlined in detail below.  05/02/22 -***  Assessment & Plan  Principal Problem:   GI bleeding Active Problems:   Acute blood loss anemia   Iron deficiency anemia   Squamous cell esophageal cancer (HCC)   Diabetes mellitus without complication (HCC)   Hyperlipidemia   GERD without esophagitis   Anxiety  *** -   *** -   *** -   *** -   *** -   Body mass index is 24.13 kg/m.  VTE ppx: SCDs Start: 04/30/22 1115   Diet:     Diet   DIET DYS 3 Room service appropriate? Yes; Fluid consistency: Thin   Consultants: *** Subjective 05/02/22    Pt reports ***   Objective   Vitals:   05/01/22 1457 05/01/22 1939 05/01/22 2337 05/02/22 0402  BP: (!) 108/55 (!) 91/51 100/63 (!) 90/54  Pulse: (!) 55 61 (!) 55 61  Resp: '18 18 16 20  '$ Temp: 98.1 F (36.7 C) 98.7 F (37.1 C) 97.8 F (36.6 C) 98 F (36.7 C)  TempSrc:  Oral Oral Oral  SpO2: 97% 97% 96% 100%  Weight:      Height:        Intake/Output Summary (Last 24 hours) at 05/02/2022 0740 Last data filed at 05/01/2022 1841 Gross per 24 hour  Intake 240 ml  Output --  Net 240 ml   Filed Weights   04/30/22 0833 05/01/22 1229  Weight: 68 kg 65.8 kg     Physical Exam: *** General: awake, alert, NAD HEENT: atraumatic, clear conjunctiva, anicteric sclera, MMM, hearing grossly normal Respiratory: normal respiratory effort. Cardiovascular: quick capillary refill, normal S1/S2, RRR, no JVD,  murmurs Gastrointestinal: soft, NT, ND Nervous: A&O x3. no gross focal neurologic deficits, normal speech Extremities: moves all equally, no edema, normal tone Skin: dry, intact, normal temperature, normal color. No rashes, lesions or ulcers on exposed skin Psychiatry: normal mood, congruent affect  Labs   I have personally reviewed the following labs and imaging studies CBC    Component Value Date/Time   WBC 6.4 05/01/2022 1840   RBC 2.92 (L) 05/01/2022 1840   HGB 9.1 (L) 05/01/2022 1840   HCT 28.5 (L) 05/01/2022 1840   PLT 154 05/01/2022 1840   MCV 97.6 05/01/2022 1840   MCH 31.2 05/01/2022 1840   MCHC 31.9 05/01/2022 1840   RDW 19.5 (H) 05/01/2022 1840   LYMPHSABS 0.8 04/18/2022 0001   MONOABS 0.4 04/18/2022 0001   EOSABS 0.2 04/18/2022 0001   BASOSABS 0.0 04/18/2022 0001      Latest Ref Rng & Units 04/30/2022    8:33 AM 04/21/2022    4:39 AM 04/20/2022    5:35 AM  BMP  Glucose 70 - 99 mg/dL 178   92   111    BUN 6 - 20 mg/dL '15   8   8    '$ Creatinine 0.61 - 1.24  mg/dL 0.90   0.76   0.74    Sodium 135 - 145 mmol/L 138   144   141    Potassium 3.5 - 5.1 mmol/L 3.9   4.2   3.2    Chloride 98 - 111 mmol/L 103   113   112    CO2 22 - 32 mmol/L '29   26   24    '$ Calcium 8.9 - 10.3 mg/dL 8.8   8.7   8.3      No results found.  Disposition Plan & Communication  Patient status: Inpatient  Admitted From: {From:[id number]} Planned disposition location: {PLAN; DISPOSITION:26386} Anticipated discharge date: *** pending ***  Family Communication: ***    Author: Richarda Osmond, DO [facility] 05/02/2022, 7:40 AM   Available by Epic secure chat 7AM-7PM. If 7PM-7AM, please contact night-coverage.  [facility] contact information found on [email or web address].

## 2022-05-02 NOTE — Anesthesia Postprocedure Evaluation (Signed)
Anesthesia Post Note  Patient: Samuel Pennington  Procedure(s) Performed: ESOPHAGOGASTRODUODENOSCOPY (EGD) WITH PROPOFOL  Patient location during evaluation: Endoscopy Anesthesia Type: General Level of consciousness: awake and alert Pain management: pain level controlled Vital Signs Assessment: post-procedure vital signs reviewed and stable Respiratory status: spontaneous breathing, nonlabored ventilation, respiratory function stable and patient connected to nasal cannula oxygen Cardiovascular status: blood pressure returned to baseline and stable Postop Assessment: no apparent nausea or vomiting Anesthetic complications: no   No notable events documented.   Last Vitals:  Vitals:   05/01/22 2337 05/02/22 0402  BP: 100/63 (!) 90/54  Pulse: (!) 55 61  Resp: 16 20  Temp: 36.6 C 36.7 C  SpO2: 96% 100%    Last Pain:  Vitals:   05/02/22 0734  TempSrc:   PainSc: 6                  Samuel Pennington

## 2022-05-02 NOTE — TOC Transition Note (Signed)
Transition of Care [facility]) - CM/SW Discharge Note   Patient Details  Name: Samuel Pennington MRN: [id number] Date of Birth: 10-25-1966  [facility] of [facility]) CM/SW Contact:  Candie Chroman, LCSW Phone Number: 05/02/2022, 12:29 PM   Clinical Narrative:   Patient has orders to discharge home today. No further concerns. CSW signing off.  Final next level of care: Home/Self Care Barriers to Discharge: Barriers Resolved   Patient Goals and CMS Choice        Discharge Placement                    Patient and family notified of of transfer: 05/02/22  Discharge Plan and Services     Post Acute Care Choice: NA                               Social Determinants of Health ([place]) Interventions     Readmission Risk Interventions    05/01/2022   12:17 PM  Readmission Risk Prevention Plan  Transportation Screening Complete  PCP or Specialist Appt within 3-5 Days Complete  Social Work Consult for [place] Planning/Counseling Complete  Palliative Care Screening Not Applicable  Medication Review Press photographer) Complete

## 2022-05-02 NOTE — Plan of Care (Signed)
  Problem: Clinical Measurements: Goal: Ability to maintain clinical measurements within normal limits will improve Outcome: Progressing Goal: Respiratory complications will improve Outcome: Progressing Goal: Cardiovascular complication will be avoided Outcome: Progressing   Problem: Activity: Goal: Risk for activity intolerance will decrease Outcome: Progressing   

## 2022-05-02 NOTE — Progress Notes (Signed)
Samuel Darby, MD [address]  [place]  [place], [place] [address]  Main: [phone]  Fax: [phone] Pager: [phone]   Subjective: No acute events overnight.  Patient underwent EGD yesterday, found to have malignant gastric ulcer that was oozing and treated with epi and cautery.  Patient denies any abdominal pain, nausea or vomiting.  He is ready to go home today   Objective: Vital signs in last 24 hours: Vitals:   05/01/22 1939 05/01/22 2337 05/02/22 0402 05/02/22 0920  BP: (!) 91/51 100/63 (!) 90/54 (!) 109/53  Pulse: 61 (!) 55 61 70  Resp: '18 16 20 16  '$ Temp: 98.7 F (37.1 C) 97.8 F (36.6 C) 98 F (36.7 C) 98.8 F (37.1 C)  TempSrc: Oral Oral Oral Oral  SpO2: 97% 96% 100% 95%  Weight:      Height:       Weight change: -2.268 kg  Intake/Output Summary (Last 24 hours) at 05/02/2022 1317 Last data filed at 05/02/2022 1046 Gross per 24 hour  Intake 1885.45 ml  Output --  Net 1885.45 ml     Exam: Heart:: Regular rate and rhythm, S1S2 present, or without murmur or extra heart sounds Lungs: normal and clear to auscultation Abdomen: soft, nontender, normal bowel sounds  Lab Results:    Latest Ref Rng & Units 05/02/2022    9:21 AM 05/01/2022    6:40 PM 05/01/2022    3:28 PM  CBC  WBC 4.0 - 10.5 K/uL 5.0   6.4   6.0    Hemoglobin 13.0 - 17.0 g/dL 9.9   9.1   9.9    Hematocrit 39.0 - 52.0 % 31.1   28.5   31.1    Platelets 150 - 400 K/uL 175   154   164        Latest Ref Rng & Units 04/30/2022    8:33 AM 04/21/2022    4:39 AM 04/20/2022    5:35 AM  CMP  Glucose 70 - 99 mg/dL 178   92   111    BUN 6 - 20 mg/dL '15   8   8    '$ Creatinine 0.61 - 1.24 mg/dL 0.90   0.76   0.74    Sodium 135 - 145 mmol/L 138   144   141    Potassium 3.5 - 5.1 mmol/L 3.9   4.2   3.2    Chloride 98 - 111 mmol/L 103   113   112    CO2 22 - 32 mmol/L '29   26   24    '$ Calcium 8.9 - 10.3 mg/dL 8.8   8.7   8.3    Total Protein 6.5 - 8.1 g/dL 6.7      Total Bilirubin 0.3  - 1.2 mg/dL 0.7      Alkaline Phos 38 - 126 U/L 61      AST 15 - 41 U/L 17      ALT 0 - 44 U/L 16        Micro Results: No results found for this or any previous visit (from the past 240 hour(s)). Studies/Results: No results found. Medications: I have reviewed the patient's current medications. Prior to Admission:  Medications Prior to Admission  Medication Sig Dispense Refill Last Dose   ALPRAZolam (XANAX) 0.25 MG tablet Take 1 tablet (0.25 mg total) by mouth at bedtime as needed for anxiety. 30 tablet 0 04/29/2022   atorvastatin (LIPITOR) 40 MG tablet TOMA UNA  TABLETA POR BOCA AL ACOSTARSE PARA COLESTEROL ALTO 90 tablet 3 04/29/2022   ferrous sulfate 325 (65 FE) MG tablet Take 1 tablet (325 mg total) by mouth daily with breakfast. 30 tablet 0 Past Week   glipiZIDE (GLUCOTROL XL) 5 MG 24 hr tablet TOME 1 TABLETA POR BOCA DIARIO PARA LA DIABETES 90 tablet 1 04/29/2022   metFORMIN (GLUCOPHAGE) 1000 MG tablet Take 1,000 mg by mouth daily.   04/29/2022   OLANZapine (ZYPREXA) 5 MG tablet Take 1 tablet (5 mg total) by mouth at bedtime as needed (nausea). 30 tablet 1 04/29/2022   oxyCODONE (OXY IR/ROXICODONE) 5 MG immediate release tablet Take 10 mg by mouth every 6 (six) hours as needed for pain.   04/30/2022   pantoprazole (PROTONIX) 40 MG tablet Take 1 tablet (40 mg total) by mouth 2 (two) times daily before a meal. 60 tablet 0 04/29/2022   polyethylene glycol (MIRALAX / GLYCOLAX) 17 g packet Take 17 g by mouth daily as needed for severe constipation. 14 each 0 Past Week   senna-docusate (SENOKOT-S) 8.6-50 MG tablet Take 1 tablet by mouth at bedtime as needed for mild constipation. 30 tablet 0 Past Week   sucralfate (CARAFATE) 1 g tablet Take 1 tablet (1 g total) by mouth 4 (four) times daily -  with meals and at bedtime. (Patient not taking: Reported on 04/18/2022) 60 tablet 0    Scheduled:  atorvastatin  40 mg Oral QPM   ferrous sulfate  325 mg Oral Q breakfast   Continuous:  sodium chloride  100 mL/hr at 05/02/22 0957   iron sucrose 150 mg (05/02/22 1229)   UUV:[id number], ALPRAZolam, ondansetron (ZOFRAN) IV, oxyCODONE, polyethylene glycol Anti-infectives (From admission, onward)    None      Scheduled Meds:  atorvastatin  40 mg Oral QPM   ferrous sulfate  325 mg Oral Q breakfast   Continuous Infusions:  sodium chloride 100 mL/hr at 05/02/22 0957   iron sucrose 150 mg (05/02/22 1229)   PRN Meds:.acetaminophen, ALPRAZolam, ondansetron (ZOFRAN) IV, oxyCODONE, polyethylene glycol   Assessment: Principal Problem:   GI bleeding Active Problems:   Diabetes mellitus without complication (HCC)   Hyperlipidemia   Acute blood loss anemia   Iron deficiency anemia   Squamous cell esophageal cancer (HCC)   GERD without esophagitis   Anxiety  Samuel Pennington is a 56 y.o. 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  He also has history of gastric ulcer that was treated with epi and clip on Apr 19, 2022.   Plan: Melena with severe iron deficiency anemia secondary to bleeding from malignant gastric ulcer S/p EGD on 5/23 treated with epi and cautery Hemoglobin is 9.9 today Patient received 2 doses of Venofer during hospitalization Patient has surgery scheduled on 06/01/2022 by Dr. Maryjane Hurter at [facility] had his esophageal cancer I advised patient to reach out to Dr. Gary Fleet office if he notices any black stools and to stay on top of his hemoglobin, receive IV iron as needed Recommend Protonix or Prilosec 40 mg p.o. twice daily Recommend oral iron daily Patient can go home today from GI standpoint   LOS: 2 days   Samuel Pennington 05/02/2022, 1:17 PM

## 2022-05-02 NOTE — Discharge Summary (Signed)
Physician Discharge Summary  Patient: Samuel Pennington EZM:[id number] DOB: October 19, 1966   Code Status: Full Code Admit date: 04/30/2022 Discharge date: 05/02/2022 Disposition: Home, No home health services recommended PCP: Samuel Burrow, MD  Recommendations for Outpatient Follow-up:  Follow up with PCP within 1-2 weeks to monitor hgb Follow up with GI/oncology at [facility] as scheduled  Discharge Diagnoses:  Principal Problem:   GI bleeding Active Problems:   Acute blood loss anemia   Iron deficiency anemia   Squamous cell esophageal cancer ([place])   Diabetes mellitus without complication ([place])   Hyperlipidemia   GERD without esophagitis   Anxiety  Brief Hospital Course Summary: 56 y.o. male with medical history significant of esophageal adenocarcinoma (s/p of chemo and RXT, expected to have surgical intervention in June at [facility]), hypertension, hyperlipidemia, GERD, anxiety, GI bleeding, iron deficiency anemia, who presents with rectal bleeding.   Patient was recently hospitalized from 5/10 - 5/13 due to GI bleeding.  Patient had EGD done, which showed large gastric ulcer which is clipped. Patient was treated with Protonix drip. His hemoglobin down to 5.9 on 04/18/2022, which improved to 9.0 on 04/20/2022 after blood transfusion.  Upon presentation to ED, his hgb was 8.2. He received 1 unit pRBCs on 5/23. His hgb was stable in mid to high 9s throughout the rest of his admission.  5/23 underwent EGD which showed the malignant gastric ulcer was oozing. Treated with epi and cautery.   5/24 patient was able to tolerate regular diet without bleeding, N/V/rectal bleeding. He was continued on PO PPI. He expressed understanding and agreement with his follow up plan.   All other chronic conditions were treated with home medications.   Discharge Condition: Good, improved Recommended discharge diet: Regular healthy diet  Consultations: GI Oncology    Procedures/Studies: EGD 5/23   Allergies as of 05/02/2022   No Known Allergies      Medication List     STOP taking these medications    senna-docusate 8.6-50 MG tablet Commonly known as: Senokot-S       TAKE these medications    acetaminophen 325 MG tablet Commonly known as: TYLENOL Take 2 tablets (650 mg total) by mouth every 6 (six) hours as needed for mild pain or fever.   ALPRAZolam 0.25 MG tablet Commonly known as: XANAX Take 1 tablet (0.25 mg total) by mouth at bedtime as needed for anxiety.   atorvastatin 40 MG tablet Commonly known as: LIPITOR TOMA UNA TABLETA POR BOCA AL ACOSTARSE PARA COLESTEROL ALTO   ferrous sulfate 325 (65 FE) MG tablet Take 1 tablet (325 mg total) by mouth daily with breakfast.   glipiZIDE 5 MG 24 hr tablet Commonly known as: GLUCOTROL XL TOME 1 TABLETA POR BOCA DIARIO PARA LA DIABETES   metFORMIN 1000 MG tablet Commonly known as: GLUCOPHAGE Take 1,000 mg by mouth daily.   OLANZapine 5 MG tablet Commonly known as: ZyPREXA Take 1 tablet (5 mg total) by mouth at bedtime as needed (nausea).   oxyCODONE 5 MG immediate release tablet Commonly known as: Oxy IR/ROXICODONE Take 10 mg by mouth every 6 (six) hours as needed for pain.   pantoprazole 40 MG tablet Commonly known as: PROTONIX Take 1 tablet (40 mg total) by mouth 2 (two) times daily before a meal.   polyethylene glycol 17 g packet Commonly known as: MIRALAX / GLYCOLAX Take 17 g by mouth daily as needed for severe constipation.   sucralfate 1 g tablet Commonly known as: Carafate Take  1 tablet (1 g total) by mouth 4 (four) times daily -  with meals and at bedtime.         Subjective   Pt reports feeling well today. He can tolerate diet and has not seen any more bleeding since EGD. He would like to go home.   Objective  Blood pressure (!) 90/54, pulse 61, temperature 98 F (36.7 C), temperature source Oral, resp. rate 20, height '5\' 5"'$  (1.651 m), weight 65.8  kg, SpO2 100 %.   General: Pt is alert, awake, not in acute distress Cardiovascular: RRR, S1/S2 +, no rubs, no gallops Respiratory: CTA bilaterally, no wheezing, no rhonchi Abdominal: Soft, NT, ND, bowel sounds + Extremities: no edema, no cyanosis   The results of significant diagnostics from this hospitalization (including imaging, microbiology, ancillary and laboratory) are listed below for reference.   Imaging studies: No results found.  Labs: Basic Metabolic Panel: Recent Labs  Lab 04/30/22 0833  NA 138  K 3.9  CL 103  CO2 29  GLUCOSE 178*  BUN 15  CREATININE 0.90  CALCIUM 8.8*   CBC: Recent Labs  Lab 05/01/22 0942 05/01/22 1149 05/01/22 1528 05/01/22 1840 05/02/22 0921  WBC 3.9* 4.0 6.0 6.4 5.0  HGB 9.1* 9.6* 9.9* 9.1* 9.9*  HCT 28.9* 30.4* 31.1* 28.5* 31.1*  MCV 97.3 98.1 97.8 97.6 96.9  PLT 150 162 164 154 175   Microbiology: None   Time coordinating discharge: Over 30 minutes  Richarda Osmond, MD  [facility] 05/02/2022, 12:17 PM

## 2022-05-02 NOTE — Progress Notes (Signed)
Discharge instructions reviewed with patient via interpreter including followup visits and new medications/medication changes.  Understanding was verbalized and all questions were answered.  IVs removed without complication; patient tolerated well.  Patient discharged home ambulatory in stable condition escorted by nursing staff.

## 2022-05-04 ENCOUNTER — Telehealth: Payer: Self-pay

## 2022-05-04 NOTE — Telephone Encounter (Signed)
Letter written to allow sister to come from [place] to help after surgery per patients request. Placed down stairs for patient to pick up.

## 2022-05-18 DIAGNOSIS — K922 Gastrointestinal hemorrhage, unspecified: Secondary | ICD-10-CM

## 2022-05-31 ENCOUNTER — Ambulatory Visit: Payer: Self-pay | Admitting: Oncology

## 2022-06-01 DIAGNOSIS — I959 Hypotension, unspecified: Secondary | ICD-10-CM | POA: Diagnosis not present

## 2022-06-01 DIAGNOSIS — E1165 Type 2 diabetes mellitus with hyperglycemia: Secondary | ICD-10-CM | POA: Diagnosis not present

## 2022-06-01 DIAGNOSIS — Z923 Personal history of irradiation: Secondary | ICD-10-CM | POA: Diagnosis not present

## 2022-06-01 DIAGNOSIS — Z9221 Personal history of antineoplastic chemotherapy: Secondary | ICD-10-CM | POA: Diagnosis not present

## 2022-06-01 DIAGNOSIS — J90 Pleural effusion, not elsewhere classified: Secondary | ICD-10-CM | POA: Diagnosis not present

## 2022-06-01 DIAGNOSIS — Z87891 Personal history of nicotine dependence: Secondary | ICD-10-CM | POA: Diagnosis not present

## 2022-06-01 DIAGNOSIS — G8929 Other chronic pain: Secondary | ICD-10-CM | POA: Diagnosis not present

## 2022-06-01 DIAGNOSIS — Z79891 Long term (current) use of opiate analgesic: Secondary | ICD-10-CM | POA: Diagnosis not present

## 2022-06-01 DIAGNOSIS — Z7984 Long term (current) use of oral hypoglycemic drugs: Secondary | ICD-10-CM | POA: Diagnosis not present

## 2022-06-01 DIAGNOSIS — Z789 Other specified health status: Secondary | ICD-10-CM | POA: Diagnosis not present

## 2022-06-01 DIAGNOSIS — E785 Hyperlipidemia, unspecified: Secondary | ICD-10-CM | POA: Diagnosis not present

## 2022-06-01 DIAGNOSIS — Z4682 Encounter for fitting and adjustment of non-vascular catheter: Secondary | ICD-10-CM | POA: Diagnosis not present

## 2022-06-01 DIAGNOSIS — R918 Other nonspecific abnormal finding of lung field: Secondary | ICD-10-CM | POA: Diagnosis not present

## 2022-06-01 DIAGNOSIS — M25511 Pain in right shoulder: Secondary | ICD-10-CM | POA: Diagnosis not present

## 2022-06-01 DIAGNOSIS — M545 Low back pain, unspecified: Secondary | ICD-10-CM | POA: Diagnosis not present

## 2022-06-01 DIAGNOSIS — C159 Malignant neoplasm of esophagus, unspecified: Secondary | ICD-10-CM | POA: Diagnosis not present

## 2022-06-01 DIAGNOSIS — Z79899 Other long term (current) drug therapy: Secondary | ICD-10-CM | POA: Diagnosis not present

## 2022-06-01 DIAGNOSIS — R739 Hyperglycemia, unspecified: Secondary | ICD-10-CM | POA: Diagnosis not present

## 2022-06-01 DIAGNOSIS — C160 Malignant neoplasm of cardia: Secondary | ICD-10-CM | POA: Diagnosis not present

## 2022-06-09 DIAGNOSIS — G8929 Other chronic pain: Secondary | ICD-10-CM | POA: Diagnosis not present

## 2022-06-09 DIAGNOSIS — R918 Other nonspecific abnormal finding of lung field: Secondary | ICD-10-CM | POA: Diagnosis not present

## 2022-06-09 DIAGNOSIS — Z79899 Other long term (current) drug therapy: Secondary | ICD-10-CM | POA: Diagnosis not present

## 2022-06-09 DIAGNOSIS — E785 Hyperlipidemia, unspecified: Secondary | ICD-10-CM | POA: Diagnosis not present

## 2022-06-09 DIAGNOSIS — Z923 Personal history of irradiation: Secondary | ICD-10-CM | POA: Diagnosis not present

## 2022-06-09 DIAGNOSIS — Z789 Other specified health status: Secondary | ICD-10-CM | POA: Diagnosis not present

## 2022-06-09 DIAGNOSIS — Z7984 Long term (current) use of oral hypoglycemic drugs: Secondary | ICD-10-CM | POA: Diagnosis not present

## 2022-06-09 DIAGNOSIS — E1165 Type 2 diabetes mellitus with hyperglycemia: Secondary | ICD-10-CM | POA: Diagnosis not present

## 2022-06-09 DIAGNOSIS — Z4682 Encounter for fitting and adjustment of non-vascular catheter: Secondary | ICD-10-CM | POA: Diagnosis not present

## 2022-06-09 DIAGNOSIS — M545 Low back pain, unspecified: Secondary | ICD-10-CM | POA: Diagnosis not present

## 2022-06-09 DIAGNOSIS — C159 Malignant neoplasm of esophagus, unspecified: Secondary | ICD-10-CM | POA: Diagnosis not present

## 2022-06-09 DIAGNOSIS — Z79891 Long term (current) use of opiate analgesic: Secondary | ICD-10-CM | POA: Diagnosis not present

## 2022-06-09 DIAGNOSIS — C160 Malignant neoplasm of cardia: Secondary | ICD-10-CM | POA: Diagnosis not present

## 2022-06-09 DIAGNOSIS — Z87891 Personal history of nicotine dependence: Secondary | ICD-10-CM | POA: Diagnosis not present

## 2022-06-09 DIAGNOSIS — R739 Hyperglycemia, unspecified: Secondary | ICD-10-CM | POA: Diagnosis not present

## 2022-06-09 DIAGNOSIS — M25511 Pain in right shoulder: Secondary | ICD-10-CM | POA: Diagnosis not present

## 2022-06-09 DIAGNOSIS — J90 Pleural effusion, not elsewhere classified: Secondary | ICD-10-CM | POA: Diagnosis not present

## 2022-06-09 DIAGNOSIS — I959 Hypotension, unspecified: Secondary | ICD-10-CM | POA: Diagnosis not present

## 2022-06-09 DIAGNOSIS — Z9221 Personal history of antineoplastic chemotherapy: Secondary | ICD-10-CM | POA: Diagnosis not present

## 2022-06-17 DIAGNOSIS — C159 Malignant neoplasm of esophagus, unspecified: Secondary | ICD-10-CM | POA: Diagnosis not present

## 2022-06-19 ENCOUNTER — Inpatient Hospital Stay: Payer: Self-pay | Attending: Internal Medicine | Admitting: Oncology

## 2022-06-19 ENCOUNTER — Encounter: Payer: Self-pay | Admitting: Oncology

## 2022-06-19 DIAGNOSIS — J939 Pneumothorax, unspecified: Secondary | ICD-10-CM | POA: Diagnosis not present

## 2022-06-19 DIAGNOSIS — C159 Malignant neoplasm of esophagus, unspecified: Secondary | ICD-10-CM | POA: Diagnosis not present

## 2022-06-19 DIAGNOSIS — Z8501 Personal history of malignant neoplasm of esophagus: Secondary | ICD-10-CM | POA: Diagnosis present

## 2022-06-19 DIAGNOSIS — R918 Other nonspecific abnormal finding of lung field: Secondary | ICD-10-CM | POA: Diagnosis not present

## 2022-06-19 DIAGNOSIS — J986 Disorders of diaphragm: Secondary | ICD-10-CM | POA: Diagnosis not present

## 2022-06-19 DIAGNOSIS — Z923 Personal history of irradiation: Secondary | ICD-10-CM | POA: Diagnosis not present

## 2022-06-19 DIAGNOSIS — Z9221 Personal history of antineoplastic chemotherapy: Secondary | ICD-10-CM | POA: Diagnosis not present

## 2022-06-19 NOTE — Progress Notes (Deleted)
[place]  [phone] [phone]  ID: [name] OB: 10/08/1966  MR#: [id number]  EHM#:[id number]  Patient Care Team: Theotis Burrow, MD as PCP - General (Family Medicine) Lloyd Huger, MD as Consulting Physician (Oncology) Clent Jacks, RN as Oncology Nurse Navigator  CHIEF COMPLAINT: Stage III poorly differentiated squamous cell of the esophagus.  INTERVAL HISTORY: Patient returns to clinic today for further evaluation and discussion of his imaging results.  He recently was having significant nausea, but on further investigation his symptoms are more consistent of acid reflux.  He reports he is "90%" improved after initiating olanzapine and Protonix.  He has insomnia, but otherwise feels well.  He does not complain of pain today.  He has no further GI bleeds. He has no neurologic complaints.  He denies any recent fevers.  He has a fair appetite, but denies weight loss.  He has no chest pain, shortness of breath, cough, or hemoptysis.  He denies any nausea, vomiting, constipation, or diarrhea.  He has no further melena or hematochezia.  He has no urinary complaints.  Patient offers no further specific complaints today.  REVIEW OF SYSTEMS:   Review of Systems  Constitutional: Negative.  Negative for fever, malaise/fatigue and weight loss.  Respiratory: Negative.  Negative for cough, hemoptysis and shortness of breath.   Cardiovascular: Negative.  Negative for chest pain and leg swelling.  Gastrointestinal:  Positive for heartburn. Negative for abdominal pain, blood in stool and melena.  Genitourinary: Negative.  Negative for dysuria.  Musculoskeletal: Negative.  Negative for back pain.  Skin: Negative.  Negative for rash.  Neurological: Negative.  Negative for dizziness, focal weakness, weakness and headaches.  Psychiatric/Behavioral:  The patient has insomnia. The patient is not nervous/anxious.      As per HPI.  Otherwise, a complete review of systems is negative.  PAST MEDICAL HISTORY: Past Medical History:  Diagnosis Date   Cancer ([place]) 2022   Diabetes mellitus without complication ([place])    Hyperlipidemia    Neuromuscular disorder ([place])     PAST SURGICAL HISTORY: Past Surgical History:  Procedure Laterality Date   COLONOSCOPY WITH PROPOFOL N/A 11/14/2021   Procedure: COLONOSCOPY WITH PROPOFOL;  Surgeon: Lesly Rubenstein, MD;  Location: [facility] ENDOSCOPY;  Service: Endoscopy;  Laterality: N/A;   ESOPHAGOGASTRODUODENOSCOPY (EGD) WITH PROPOFOL N/A 11/14/2021   Procedure: ESOPHAGOGASTRODUODENOSCOPY (EGD) WITH PROPOFOL;  Surgeon: Lesly Rubenstein, MD;  Location: [facility] ENDOSCOPY;  Service: Endoscopy;  Laterality: N/A;   ESOPHAGOGASTRODUODENOSCOPY (EGD) WITH PROPOFOL N/A 04/19/2022   Procedure: ESOPHAGOGASTRODUODENOSCOPY (EGD) WITH PROPOFOL;  Surgeon: Jonathon Bellows, MD;  Location: [facility] ENDOSCOPY;  Service: Gastroenterology;  Laterality: N/A;   ESOPHAGOGASTRODUODENOSCOPY (EGD) WITH PROPOFOL N/A 05/01/2022   Procedure: ESOPHAGOGASTRODUODENOSCOPY (EGD) WITH PROPOFOL;  Surgeon: Lin Landsman, MD;  Location: [facility] ENDOSCOPY;  Service: Gastroenterology;  Laterality: N/A;   EUS N/A 12/14/2021   Procedure: FULL UPPER ENDOSCOPIC ULTRASOUND (EUS) RADIAL;  Surgeon: Holly Bodily, MD;  Location[facility] ENDOSCOPY;  Service: Gastroenterology;  Laterality: N/A;  SPANISH INTERPRETER    FAMILY HISTORY: Family History  Problem Relation Age of Onset   Hypertension Mother     ADVANCED DIRECTIVES (Y/N):  N  HEALTH MAINTENANCE: Social History   Tobacco Use   Smoking status: Never   Smokeless tobacco: Never  Vaping Use   Vaping Use: Never used  Substance Use Topics   Alcohol use: Yes    Comment: occassionallh   Drug use: Never     Colonoscopy:  PAP:  Bone density:  Lipid panel:  No Known Allergies  Current Outpatient Medications  Medication Sig Dispense Refill   acetaminophen (TYLENOL) 325  MG tablet Take 2 tablets (650 mg total) by mouth every 6 (six) hours as needed for mild pain or fever.     ALPRAZolam (XANAX) 0.25 MG tablet Take 1 tablet (0.25 mg total) by mouth at bedtime as needed for anxiety. 30 tablet 0   atorvastatin (LIPITOR) 40 MG tablet TOMA UNA TABLETA POR BOCA AL ACOSTARSE PARA COLESTEROL ALTO 90 tablet 3   ferrous sulfate 325 (65 FE) MG tablet Take 1 tablet (325 mg total) by mouth daily with breakfast. 30 tablet 0   glipiZIDE (GLUCOTROL XL) 5 MG 24 hr tablet TOME 1 TABLETA POR BOCA DIARIO PARA LA DIABETES 90 tablet 1   metFORMIN (GLUCOPHAGE) 1000 MG tablet Take 1,000 mg by mouth daily.     OLANZapine (ZYPREXA) 5 MG tablet Take 1 tablet (5 mg total) by mouth at bedtime as needed (nausea). 30 tablet 1   oxyCODONE (OXY IR/ROXICODONE) 5 MG immediate release tablet Take 10 mg by mouth every 6 (six) hours as needed for pain.     pantoprazole (PROTONIX) 40 MG tablet Take 1 tablet (40 mg total) by mouth 2 (two) times daily before a meal. 60 tablet 0   polyethylene glycol (MIRALAX / GLYCOLAX) 17 g packet Take 17 g by mouth daily as needed for severe constipation. 14 each 0   sucralfate (CARAFATE) 1 g tablet Take 1 tablet (1 g total) by mouth 4 (four) times daily -  with meals and at bedtime. (Patient not taking: Reported on 04/18/2022) 60 tablet 0   No current facility-administered medications for this visit.    OBJECTIVE: There were no vitals filed for this visit.      There is no height or weight on file to calculate BMI.    ECOG FS:0 - Asymptomatic  General: Well-developed, well-nourished, no acute distress. Eyes: Pink conjunctiva, anicteric sclera. HEENT: Normocephalic, moist mucous membranes. Lungs: No audible wheezing or coughing. Heart: Regular rate and rhythm. Abdomen: Soft, nontender, no obvious distention. Musculoskeletal: No edema, cyanosis, or clubbing. Neuro: Alert, answering all questions appropriately. Cranial nerves grossly intact. Skin: No rashes or  petechiae noted. Psych: Normal affect.  LAB RESULTS:  Lab Results  Component Value Date   NA 138 04/30/2022   K 3.9 04/30/2022   CL 103 04/30/2022   CO2 29 04/30/2022   GLUCOSE 178 (H) 04/30/2022   BUN 15 04/30/2022   CREATININE 0.90 04/30/2022   CALCIUM 8.8 (L) 04/30/2022   PROT 6.7 04/30/2022   ALBUMIN 3.9 04/30/2022   AST 17 04/30/2022   ALT 16 04/30/2022   ALKPHOS 61 04/30/2022   BILITOT 0.7 04/30/2022   GFRNONAA >60 04/30/2022    Lab Results  Component Value Date   WBC 5.0 05/02/2022   NEUTROABS 3.0 04/18/2022   HGB 9.9 (L) 05/02/2022   HCT 31.1 (L) 05/02/2022   MCV 96.9 05/02/2022   PLT 175 05/02/2022   Lab Results  Component Value Date   IRON 28 (L) 04/18/2022   TIBC 333 04/18/2022   IRONPCTSAT 8 (L) 04/18/2022   Lab Results  Component Value Date   FERRITIN 6 (L) 04/18/2022     STUDIES: No results found.  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  PET scan results from December 13, 2021 reviewed independently and report as above with no obvious metastatic disease outside of patient's known esophageal mass.  EUS completed on December 14, 2021 confirmed stage.  Patient completed cycle 6 of weekly carboplatinum and Taxol on February 06, 2022.  He subsequently completed his XRT on February 12, 2022.  He had consultation with GI surgery at [facility] yesterday and plans to have resection sometime in May.  Surgery date has not been scheduled.  CT scan results from March 19, 2022 reviewed independently and reported as above with significant improvement of tumor burden.  Return to clinic 2 to 3 weeks after surgery for routine evaluation.    Iron deficiency anemia: Hemoglobin has trended down slightly to 10.7.  Monitor.  He last received IV Venofer on January 16, 2022.   Pain: Patient is seen  regularly by [facility] pain clinic and receives narcotics from them.  Continue follow-up as scheduled.   Reflux: Continue olanzapine and Protonix as prescribed. Insomnia: Patient was given a prescription for Xanax as needed. Dysphagia: Patient does not complain of this today.   Patient expressed understanding and was in agreement with this plan. He also understands that He can call clinic at any time with any questions, concerns, or complaints.    Cancer Staging  Squamous cell esophageal cancer [facility]) Staging form: Esophagus - Adenocarcinoma, AJCC 8th Edition - Clinical stage from 12/22/2021: Stage III (cT3, cN0, cM0) - Signed by Lloyd Huger, MD on 12/22/2021 Stage prefix: Initial diagnosis Total positive nodes: 0   Lloyd Huger, MD   06/19/2022 7:01 AM

## 2022-06-25 DIAGNOSIS — C159 Malignant neoplasm of esophagus, unspecified: Secondary | ICD-10-CM | POA: Diagnosis not present

## 2022-06-26 DIAGNOSIS — M545 Low back pain, unspecified: Secondary | ICD-10-CM | POA: Diagnosis not present

## 2022-06-26 DIAGNOSIS — G8929 Other chronic pain: Secondary | ICD-10-CM | POA: Diagnosis not present

## 2022-06-26 DIAGNOSIS — Z79899 Other long term (current) drug therapy: Secondary | ICD-10-CM | POA: Diagnosis not present

## 2022-06-26 DIAGNOSIS — Z923 Personal history of irradiation: Secondary | ICD-10-CM | POA: Diagnosis not present

## 2022-06-26 DIAGNOSIS — X58XXXD Exposure to other specified factors, subsequent encounter: Secondary | ICD-10-CM | POA: Diagnosis not present

## 2022-06-26 DIAGNOSIS — G8918 Other acute postprocedural pain: Secondary | ICD-10-CM | POA: Diagnosis not present

## 2022-06-26 DIAGNOSIS — T17908D Unspecified foreign body in respiratory tract, part unspecified causing other injury, subsequent encounter: Secondary | ICD-10-CM | POA: Diagnosis not present

## 2022-06-26 DIAGNOSIS — C159 Malignant neoplasm of esophagus, unspecified: Secondary | ICD-10-CM | POA: Diagnosis not present

## 2022-06-26 DIAGNOSIS — Z87891 Personal history of nicotine dependence: Secondary | ICD-10-CM | POA: Diagnosis not present

## 2022-06-26 DIAGNOSIS — Z8501 Personal history of malignant neoplasm of esophagus: Secondary | ICD-10-CM | POA: Diagnosis not present

## 2022-06-28 DIAGNOSIS — C159 Malignant neoplasm of esophagus, unspecified: Secondary | ICD-10-CM | POA: Diagnosis not present

## 2022-06-28 DIAGNOSIS — S2231XA Fracture of one rib, right side, initial encounter for closed fracture: Secondary | ICD-10-CM | POA: Diagnosis not present

## 2022-06-28 DIAGNOSIS — R079 Chest pain, unspecified: Secondary | ICD-10-CM | POA: Diagnosis not present

## 2022-06-28 DIAGNOSIS — R109 Unspecified abdominal pain: Secondary | ICD-10-CM | POA: Diagnosis not present

## 2022-07-02 NOTE — Progress Notes (Deleted)
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  INTERVAL HISTORY: Patient returns to clinic today for further evaluation and discussion of his imaging results.  He recently was having significant nausea, but on further investigation his symptoms are more consistent of acid reflux.  He reports he is "90%" improved after initiating olanzapine and Protonix.  He has insomnia, but otherwise feels well.  He does not complain of pain today.  He has no further GI bleeds. He has no neurologic complaints.  He denies any recent fevers.  He has a fair appetite, but denies weight loss.  He has no chest pain, shortness of breath, cough, or hemoptysis.  He denies any nausea, vomiting, constipation, or diarrhea.  He has no further melena or hematochezia.  He has no urinary complaints.  Patient offers no further specific complaints today.  REVIEW OF SYSTEMS:   Review of Systems  Constitutional: Negative.  Negative for fever, malaise/fatigue and weight loss.  Respiratory: Negative.  Negative for cough, hemoptysis and shortness of breath.   Cardiovascular: Negative.  Negative for chest pain and leg swelling.  Gastrointestinal:  Positive for heartburn. Negative for abdominal pain, blood in stool and melena.  Genitourinary: Negative.  Negative for dysuria.  Musculoskeletal: Negative.  Negative for back pain.  Skin: Negative.  Negative for rash.  Neurological: Negative.  Negative for dizziness, focal weakness, weakness and headaches.  Psychiatric/Behavioral:  The patient has insomnia. The patient is not nervous/anxious.      As per HPI.  Otherwise, a complete review of systems is negative.  PAST MEDICAL HISTORY: Past Medical History:  Diagnosis Date   Cancer ([place]) 2022   Diabetes mellitus without complication ([place])    Hyperlipidemia    Neuromuscular disorder ([place])     PAST SURGICAL HISTORY: Past Surgical History:  Procedure Laterality Date   COLONOSCOPY WITH PROPOFOL N/A 11/14/2021   Procedure: COLONOSCOPY WITH PROPOFOL;  Surgeon: Lesly Rubenstein, MD;  Location: [facility] ENDOSCOPY;  Service: Endoscopy;  Laterality: N/A;   ESOPHAGOGASTRODUODENOSCOPY (EGD) WITH PROPOFOL N/A 11/14/2021   Procedure: ESOPHAGOGASTRODUODENOSCOPY (EGD) WITH PROPOFOL;  Surgeon: Lesly Rubenstein, MD;  Location: [facility] ENDOSCOPY;  Service: Endoscopy;  Laterality: N/A;   ESOPHAGOGASTRODUODENOSCOPY (EGD) WITH PROPOFOL N/A 04/19/2022   Procedure: ESOPHAGOGASTRODUODENOSCOPY (EGD) WITH PROPOFOL;  Surgeon: Jonathon Bellows, MD;  Location: [facility] ENDOSCOPY;  Service: Gastroenterology;  Laterality: N/A;   ESOPHAGOGASTRODUODENOSCOPY (EGD) WITH PROPOFOL N/A 05/01/2022   Procedure: ESOPHAGOGASTRODUODENOSCOPY (EGD) WITH PROPOFOL;  Surgeon: Lin Landsman, MD;  Location: [facility] ENDOSCOPY;  Service: Gastroenterology;  Laterality: N/A;   EUS N/A 12/14/2021   Procedure: FULL UPPER ENDOSCOPIC ULTRASOUND (EUS) RADIAL;  Surgeon: Holly Bodily, MD;  Location: [facility] ENDOSCOPY;  Service: Gastroenterology;  Laterality: N/A;  SPANISH INTERPRETER    FAMILY HISTORY: Family History  Problem Relation Age of Onset   Hypertension Mother     ADVANCED DIRECTIVES (Y/N):  N  HEALTH MAINTENANCE: Social History   Tobacco Use   Smoking status: Never   Smokeless tobacco: Never  Vaping Use   Vaping Use: Never used  Substance Use Topics   Alcohol use: Yes    Comment: occassionallh   Drug use: Never     Colonoscopy:  PAP:  Bone density:  Lipid panel:  No Known Allergies  Current Outpatient Medications  Medication Sig Dispense Refill   acetaminophen (TYLENOL) 325  MG tablet Take 2 tablets (650 mg total) by mouth every 6 (six) hours as needed for mild pain or fever.     ALPRAZolam (XANAX) 0.25 MG tablet Take 1 tablet (0.25 mg total) by mouth at bedtime as needed for anxiety. 30 tablet 0   atorvastatin (LIPITOR) 40 MG tablet TOMA UNA TABLETA POR BOCA AL ACOSTARSE PARA COLESTEROL ALTO 90 tablet 3   ferrous sulfate 325 (65 FE) MG tablet Take 1 tablet (325 mg total) by mouth daily with breakfast. 30 tablet 0   glipiZIDE (GLUCOTROL XL) 5 MG 24 hr tablet TOME 1 TABLETA POR BOCA DIARIO PARA LA DIABETES 90 tablet 1   metFORMIN (GLUCOPHAGE) 1000 MG tablet Take 1,000 mg by mouth daily.     OLANZapine (ZYPREXA) 5 MG tablet Take 1 tablet (5 mg total) by mouth at bedtime as needed (nausea). 30 tablet 1   oxyCODONE (OXY IR/ROXICODONE) 5 MG immediate release tablet Take 10 mg by mouth every 6 (six) hours as needed for pain.     pantoprazole (PROTONIX) 40 MG tablet Take 1 tablet (40 mg total) by mouth 2 (two) times daily before a meal. 60 tablet 0   polyethylene glycol (MIRALAX / GLYCOLAX) 17 g packet Take 17 g by mouth daily as needed for severe constipation. 14 each 0   sucralfate (CARAFATE) 1 g tablet Take 1 tablet (1 g total) by mouth 4 (four) times daily -  with meals and at bedtime. (Patient not taking: Reported on 04/18/2022) 60 tablet 0   No current facility-administered medications for this visit.    OBJECTIVE: There were no vitals filed for this visit.      There is no height or weight on file to calculate BMI.    ECOG FS:0 - Asymptomatic  General: Well-developed, well-nourished, no acute distress. Eyes: Pink conjunctiva, anicteric sclera. HEENT: Normocephalic, moist mucous membranes. Lungs: No audible wheezing or coughing. Heart: Regular rate and rhythm. Abdomen: Soft, nontender, no obvious distention. Musculoskeletal: No edema, cyanosis, or clubbing. Neuro: Alert, answering all questions appropriately. Cranial nerves grossly intact. Skin: No rashes or  petechiae noted. Psych: Normal affect.  LAB RESULTS:  Lab Results  Component Value Date   NA 138 04/30/2022   K 3.9 04/30/2022   CL 103 04/30/2022   CO2 29 04/30/2022   GLUCOSE 178 (H) 04/30/2022   BUN 15 04/30/2022   CREATININE 0.90 04/30/2022   CALCIUM 8.8 (L) 04/30/2022   PROT 6.7 04/30/2022   ALBUMIN 3.9 04/30/2022   AST 17 04/30/2022   ALT 16 04/30/2022   ALKPHOS 61 04/30/2022   BILITOT 0.7 04/30/2022   GFRNONAA >60 04/30/2022    Lab Results  Component Value Date   WBC 5.0 05/02/2022   NEUTROABS 3.0 04/18/2022   HGB 9.9 (L) 05/02/2022   HCT 31.1 (L) 05/02/2022   MCV 96.9 05/02/2022   PLT 175 05/02/2022   Lab Results  Component Value Date   IRON 28 (L) 04/18/2022   TIBC 333 04/18/2022   IRONPCTSAT 8 (L) 04/18/2022   Lab Results  Component Value Date   FERRITIN 6 (L) 04/18/2022     STUDIES: No results found.  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  PET scan results from December 13, 2021 reviewed independently and report as above with no obvious metastatic disease outside of patient's known esophageal mass.  EUS completed on December 14, 2021 confirmed stage.  Patient completed cycle 6 of weekly carboplatinum and Taxol on February 06, 2022.  He subsequently completed his XRT on February 12, 2022.  He had consultation with GI surgery at [facility] yesterday and plans to have resection sometime in May.  Surgery date has not been scheduled.  CT scan results from March 19, 2022 reviewed independently and reported as above with significant improvement of tumor burden.  Return to clinic 2 to 3 weeks after surgery for routine evaluation.    Iron deficiency anemia: Hemoglobin has trended down slightly to 10.7.  Monitor.  He last received IV Venofer on January 16, 2022.   Pain: Patient is seen  regularly by [facility] pain clinic and receives narcotics from them.  Continue follow-up as scheduled.   Reflux: Continue olanzapine and Protonix as prescribed. Insomnia: Patient was given a prescription for Xanax as needed. Dysphagia: Patient does not complain of this today.   Patient expressed understanding and was in agreement with this plan. He also understands that He can call clinic at any time with any questions, concerns, or complaints.    Cancer Staging  Squamous cell esophageal cancer [facility]) Staging form: Esophagus - Adenocarcinoma, AJCC 8th Edition - Clinical stage from 12/22/2021: Stage III (cT3, cN0, cM0) - Signed by Lloyd Huger, MD on 12/22/2021 Stage prefix: Initial diagnosis Total positive nodes: 0   Lloyd Huger, MD   07/02/2022 2:46 PM

## 2022-07-03 ENCOUNTER — Telehealth: Payer: Self-pay

## 2022-07-03 ENCOUNTER — Inpatient Hospital Stay: Payer: Self-pay | Admitting: Oncology

## 2022-07-03 DIAGNOSIS — C159 Malignant neoplasm of esophagus, unspecified: Secondary | ICD-10-CM

## 2022-07-03 NOTE — Telephone Encounter (Signed)
Patient left VM at 11:15am to let us know that he has no transportation to his appt today and needs to reschedule.

## 2022-07-04 DIAGNOSIS — J860 Pyothorax with fistula: Secondary | ICD-10-CM | POA: Diagnosis not present

## 2022-07-04 DIAGNOSIS — Z4682 Encounter for fitting and adjustment of non-vascular catheter: Secondary | ICD-10-CM | POA: Diagnosis not present

## 2022-07-04 DIAGNOSIS — Z9049 Acquired absence of other specified parts of digestive tract: Secondary | ICD-10-CM | POA: Diagnosis not present

## 2022-07-05 DIAGNOSIS — E119 Type 2 diabetes mellitus without complications: Secondary | ICD-10-CM | POA: Diagnosis not present

## 2022-07-05 DIAGNOSIS — Z794 Long term (current) use of insulin: Secondary | ICD-10-CM | POA: Diagnosis not present

## 2022-07-05 DIAGNOSIS — J860 Pyothorax with fistula: Secondary | ICD-10-CM | POA: Diagnosis not present

## 2022-07-11 DIAGNOSIS — Z9221 Personal history of antineoplastic chemotherapy: Secondary | ICD-10-CM | POA: Diagnosis not present

## 2022-07-11 DIAGNOSIS — Z8501 Personal history of malignant neoplasm of esophagus: Secondary | ICD-10-CM | POA: Diagnosis not present

## 2022-07-11 DIAGNOSIS — C159 Malignant neoplasm of esophagus, unspecified: Secondary | ICD-10-CM | POA: Diagnosis not present

## 2022-07-11 DIAGNOSIS — J860 Pyothorax with fistula: Secondary | ICD-10-CM | POA: Diagnosis not present

## 2022-07-11 DIAGNOSIS — Z923 Personal history of irradiation: Secondary | ICD-10-CM | POA: Diagnosis not present

## 2022-07-11 DIAGNOSIS — R058 Other specified cough: Secondary | ICD-10-CM | POA: Diagnosis not present

## 2022-07-17 DIAGNOSIS — Z01818 Encounter for other preprocedural examination: Secondary | ICD-10-CM | POA: Diagnosis not present

## 2022-07-17 DIAGNOSIS — R058 Other specified cough: Secondary | ICD-10-CM | POA: Diagnosis not present

## 2022-07-17 DIAGNOSIS — Z8501 Personal history of malignant neoplasm of esophagus: Secondary | ICD-10-CM | POA: Diagnosis not present

## 2022-07-17 DIAGNOSIS — E119 Type 2 diabetes mellitus without complications: Secondary | ICD-10-CM | POA: Diagnosis not present

## 2022-07-17 DIAGNOSIS — J860 Pyothorax with fistula: Secondary | ICD-10-CM | POA: Diagnosis not present

## 2022-07-17 DIAGNOSIS — C159 Malignant neoplasm of esophagus, unspecified: Secondary | ICD-10-CM | POA: Diagnosis not present

## 2022-07-17 DIAGNOSIS — D649 Anemia, unspecified: Secondary | ICD-10-CM | POA: Diagnosis not present

## 2022-07-25 ENCOUNTER — Ambulatory Visit: Admission: RE | Admit: 2022-07-25 | Payer: Self-pay | Source: Ambulatory Visit | Admitting: Radiation Oncology

## 2022-07-25 DIAGNOSIS — G8929 Other chronic pain: Secondary | ICD-10-CM | POA: Diagnosis not present

## 2022-07-25 DIAGNOSIS — E119 Type 2 diabetes mellitus without complications: Secondary | ICD-10-CM | POA: Diagnosis not present

## 2022-07-25 DIAGNOSIS — J984 Other disorders of lung: Secondary | ICD-10-CM | POA: Diagnosis not present

## 2022-07-25 DIAGNOSIS — D509 Iron deficiency anemia, unspecified: Secondary | ICD-10-CM | POA: Diagnosis not present

## 2022-07-25 DIAGNOSIS — Z8501 Personal history of malignant neoplasm of esophagus: Secondary | ICD-10-CM | POA: Diagnosis not present

## 2022-07-25 DIAGNOSIS — Z7984 Long term (current) use of oral hypoglycemic drugs: Secondary | ICD-10-CM | POA: Diagnosis not present

## 2022-07-25 DIAGNOSIS — J860 Pyothorax with fistula: Secondary | ICD-10-CM | POA: Diagnosis not present

## 2022-07-25 DIAGNOSIS — J850 Gangrene and necrosis of lung: Secondary | ICD-10-CM | POA: Diagnosis not present

## 2022-07-25 DIAGNOSIS — R69 Illness, unspecified: Secondary | ICD-10-CM | POA: Diagnosis not present

## 2022-07-25 DIAGNOSIS — J9809 Other diseases of bronchus, not elsewhere classified: Secondary | ICD-10-CM | POA: Diagnosis not present

## 2022-07-26 DIAGNOSIS — C159 Malignant neoplasm of esophagus, unspecified: Secondary | ICD-10-CM | POA: Diagnosis not present

## 2022-08-07 DIAGNOSIS — J860 Pyothorax with fistula: Secondary | ICD-10-CM | POA: Diagnosis not present

## 2022-08-07 DIAGNOSIS — Z789 Other specified health status: Secondary | ICD-10-CM | POA: Diagnosis not present

## 2022-08-07 DIAGNOSIS — C159 Malignant neoplasm of esophagus, unspecified: Secondary | ICD-10-CM | POA: Diagnosis not present

## 2022-08-15 DIAGNOSIS — K9413 Enterostomy malfunction: Secondary | ICD-10-CM | POA: Diagnosis not present

## 2022-08-15 DIAGNOSIS — Z789 Other specified health status: Secondary | ICD-10-CM | POA: Diagnosis not present

## 2022-08-15 DIAGNOSIS — C159 Malignant neoplasm of esophagus, unspecified: Secondary | ICD-10-CM | POA: Diagnosis not present

## 2022-09-05 DIAGNOSIS — J860 Pyothorax with fistula: Secondary | ICD-10-CM | POA: Diagnosis not present

## 2022-09-05 DIAGNOSIS — C159 Malignant neoplasm of esophagus, unspecified: Secondary | ICD-10-CM | POA: Diagnosis not present

## 2022-09-05 DIAGNOSIS — J9809 Other diseases of bronchus, not elsewhere classified: Secondary | ICD-10-CM | POA: Diagnosis not present

## 2022-09-05 DIAGNOSIS — Z794 Long term (current) use of insulin: Secondary | ICD-10-CM | POA: Diagnosis not present

## 2022-09-05 DIAGNOSIS — E119 Type 2 diabetes mellitus without complications: Secondary | ICD-10-CM | POA: Diagnosis not present

## 2022-09-05 DIAGNOSIS — Z923 Personal history of irradiation: Secondary | ICD-10-CM | POA: Diagnosis not present

## 2022-09-05 DIAGNOSIS — Z9221 Personal history of antineoplastic chemotherapy: Secondary | ICD-10-CM | POA: Diagnosis not present

## 2022-09-05 DIAGNOSIS — R918 Other nonspecific abnormal finding of lung field: Secondary | ICD-10-CM | POA: Diagnosis not present

## 2022-09-05 DIAGNOSIS — R69 Illness, unspecified: Secondary | ICD-10-CM | POA: Diagnosis not present

## 2022-09-05 DIAGNOSIS — Z79899 Other long term (current) drug therapy: Secondary | ICD-10-CM | POA: Diagnosis not present

## 2022-09-05 DIAGNOSIS — Z934 Other artificial openings of gastrointestinal tract status: Secondary | ICD-10-CM | POA: Diagnosis not present

## 2022-09-05 DIAGNOSIS — Z9049 Acquired absence of other specified parts of digestive tract: Secondary | ICD-10-CM | POA: Diagnosis not present

## 2022-09-05 DIAGNOSIS — Z87891 Personal history of nicotine dependence: Secondary | ICD-10-CM | POA: Diagnosis not present

## 2022-09-05 DIAGNOSIS — D649 Anemia, unspecified: Secondary | ICD-10-CM | POA: Diagnosis not present

## 2022-09-05 DIAGNOSIS — G8929 Other chronic pain: Secondary | ICD-10-CM | POA: Diagnosis not present

## 2022-09-05 DIAGNOSIS — K9189 Other postprocedural complications and disorders of digestive system: Secondary | ICD-10-CM | POA: Diagnosis not present

## 2022-09-05 DIAGNOSIS — D509 Iron deficiency anemia, unspecified: Secondary | ICD-10-CM | POA: Diagnosis not present

## 2022-09-05 DIAGNOSIS — Z8501 Personal history of malignant neoplasm of esophagus: Secondary | ICD-10-CM | POA: Diagnosis not present

## 2022-09-11 ENCOUNTER — Telehealth: Payer: Self-pay

## 2022-09-11 NOTE — Telephone Encounter (Signed)
Mr. Bartholomew has not kept any follow up since surgery. Message sent to scheduling to attempt reschedule once more.

## 2022-09-11 NOTE — Telephone Encounter (Signed)
Called and spoke to pt. He states that he does not want to r/s at this time, he has a lot of appts at [facility]. He states his surgery did not have a good outcome and he has been going to [facility] frequently to follow up after surgery. Encouraged him to call us to r/s appt asap. He verbalized understanding.

## 2022-09-13 DIAGNOSIS — C159 Malignant neoplasm of esophagus, unspecified: Secondary | ICD-10-CM | POA: Diagnosis not present

## 2022-09-21 DIAGNOSIS — J860 Pyothorax with fistula: Secondary | ICD-10-CM | POA: Diagnosis not present

## 2022-09-21 DIAGNOSIS — Z8501 Personal history of malignant neoplasm of esophagus: Secondary | ICD-10-CM | POA: Diagnosis not present

## 2022-09-21 DIAGNOSIS — C159 Malignant neoplasm of esophagus, unspecified: Secondary | ICD-10-CM | POA: Diagnosis not present

## 2022-09-25 DIAGNOSIS — Z01818 Encounter for other preprocedural examination: Secondary | ICD-10-CM | POA: Diagnosis not present

## 2022-09-25 DIAGNOSIS — R590 Localized enlarged lymph nodes: Secondary | ICD-10-CM | POA: Diagnosis not present

## 2022-09-25 DIAGNOSIS — C159 Malignant neoplasm of esophagus, unspecified: Secondary | ICD-10-CM | POA: Diagnosis not present

## 2022-09-26 DIAGNOSIS — Z8501 Personal history of malignant neoplasm of esophagus: Secondary | ICD-10-CM | POA: Diagnosis not present

## 2022-09-26 DIAGNOSIS — Z923 Personal history of irradiation: Secondary | ICD-10-CM | POA: Diagnosis not present

## 2022-09-26 DIAGNOSIS — C159 Malignant neoplasm of esophagus, unspecified: Secondary | ICD-10-CM | POA: Diagnosis not present

## 2022-09-26 DIAGNOSIS — Z9221 Personal history of antineoplastic chemotherapy: Secondary | ICD-10-CM | POA: Diagnosis not present

## 2022-09-26 DIAGNOSIS — J860 Pyothorax with fistula: Secondary | ICD-10-CM | POA: Diagnosis not present

## 2022-10-04 ENCOUNTER — Other Ambulatory Visit: Payer: Self-pay

## 2022-10-04 ENCOUNTER — Encounter: Payer: Self-pay | Admitting: Emergency Medicine

## 2022-10-04 ENCOUNTER — Emergency Department: Payer: 59

## 2022-10-04 ENCOUNTER — Emergency Department
Admission: EM | Admit: 2022-10-04 | Discharge: 2022-10-04 | Disposition: A | Discharge status: Home / Self Care | Payer: 59 | Attending: Emergency Medicine | Admitting: Emergency Medicine

## 2022-10-04 ENCOUNTER — Encounter: Payer: Self-pay | Admitting: Oncology

## 2022-10-04 DIAGNOSIS — Z8501 Personal history of malignant neoplasm of esophagus: Secondary | ICD-10-CM | POA: Insufficient documentation

## 2022-10-04 DIAGNOSIS — R918 Other nonspecific abnormal finding of lung field: Secondary | ICD-10-CM | POA: Diagnosis not present

## 2022-10-04 DIAGNOSIS — R7309 Other abnormal glucose: Secondary | ICD-10-CM | POA: Diagnosis not present

## 2022-10-04 DIAGNOSIS — J9811 Atelectasis: Secondary | ICD-10-CM | POA: Diagnosis not present

## 2022-10-04 DIAGNOSIS — I700 Atherosclerosis of aorta: Secondary | ICD-10-CM | POA: Diagnosis not present

## 2022-10-04 DIAGNOSIS — R042 Hemoptysis: Secondary | ICD-10-CM

## 2022-10-04 DIAGNOSIS — J984 Other disorders of lung: Secondary | ICD-10-CM | POA: Diagnosis not present

## 2022-10-04 LAB — COMPREHENSIVE METABOLIC PANEL
ALT: 63 U/L — ABNORMAL HIGH (ref 0–44)
AST: 30 U/L (ref 15–41)
Albumin: 3.7 g/dL (ref 3.5–5.0)
Alkaline Phosphatase: 122 U/L (ref 38–126)
Anion gap: 7 (ref 5–15)
BUN: 18 mg/dL (ref 6–20)
CO2: 31 mmol/L (ref 22–32)
Calcium: 9.1 mg/dL (ref 8.9–10.3)
Chloride: 101 mmol/L (ref 98–111)
Creatinine, Ser: 0.72 mg/dL (ref 0.61–1.24)
GFR, Estimated: >60 mL/min (ref >60)
Glucose, Bld: 117 mg/dL — ABNORMAL HIGH (ref 70–99)
Potassium: 3.9 mmol/L (ref 3.5–5.1)
Sodium: 139 mmol/L (ref 135–145)
Total Bilirubin: <0.1 mg/dL — ABNORMAL LOW (ref 0.3–1.2)
Total Protein: 8.6 g/dL — ABNORMAL HIGH (ref 6.5–8.1)

## 2022-10-04 LAB — CBC WITH DIFFERENTIAL/PLATELET
Abs Immature Granulocytes: 0.03 10*3/uL (ref 0.00–0.07)
Basophils Absolute: 0 10*3/uL (ref 0.0–0.1)
Basophils Relative: 0 %
Eosinophils Absolute: 0.3 10*3/uL (ref 0.0–0.5)
Eosinophils Relative: 4 %
HCT: 34.5 % — ABNORMAL LOW (ref 39.0–52.0)
Hemoglobin: 10.6 g/dL — ABNORMAL LOW (ref 13.0–17.0)
Immature Granulocytes: 0 %
Lymphocytes Relative: 13 %
Lymphs Abs: 1 10*3/uL (ref 0.7–4.0)
MCH: 28.1 pg (ref 26.0–34.0)
MCHC: 30.7 g/dL (ref 30.0–36.0)
MCV: 91.5 fL (ref 80.0–100.0)
Monocytes Absolute: 0.5 10*3/uL (ref 0.1–1.0)
Monocytes Relative: 7 %
Neutro Abs: 5.5 10*3/uL (ref 1.7–7.7)
Neutrophils Relative %: 76 %
Platelets: 230 10*3/uL (ref 150–400)
RBC: 3.77 MIL/uL — ABNORMAL LOW (ref 4.22–5.81)
RDW: 16.7 % — ABNORMAL HIGH (ref 11.5–15.5)
WBC: 7.2 10*3/uL (ref 4.0–10.5)
nRBC: 0 % (ref 0.0–0.2)

## 2022-10-04 LAB — CBG MONITORING, ED: Glucose-Capillary: 80 mg/dL (ref 70–99)

## 2022-10-04 LAB — TYPE AND SCREEN
ABO/RH(D): A POS
Antibody Screen: NEGATIVE

## 2022-10-04 MED ORDER — MORPHINE SULFATE (PF) 4 MG/ML IV SOLN
6.0000 mg | Freq: Once | INTRAVENOUS | Status: AC
  Administered 2022-10-04: 6 mg via INTRAVENOUS
  Filled 2022-10-04: qty 2

## 2022-10-04 MED ORDER — AMOXICILLIN-POT CLAVULANATE 875-125 MG PO TABS: 1.0000 | ORAL_TABLET | Freq: Two times a day (BID) | ORAL | 0 refills | Status: AC

## 2022-10-04 MED ORDER — OXYCODONE-ACETAMINOPHEN 5-325 MG PO TABS
1.0000 | ORAL_TABLET | Freq: Once | ORAL | Status: DC
  Filled 2022-10-04: qty 1

## 2022-10-04 MED ORDER — IOHEXOL 300 MG/ML  SOLN
75.0000 mL | Freq: Once | INTRAMUSCULAR | Status: AC | PRN
  Administered 2022-10-04: 75 mL via INTRAVENOUS

## 2022-10-04 MED ORDER — SODIUM CHLORIDE 0.9 % IV SOLN: Freq: Once | INTRAVENOUS | Status: AC

## 2022-10-04 NOTE — Discharge Instructions (Addendum)
I spoke with [facility].  Dr. Maryjane Hurter will contact you about follow-up next week.  In the meantime we will give you some Augmentin 1 twice a day to help with any possible infection.  Please take the Augmentin with food.  It can cause diarrhea in children but usually adults do well with it.  Taking it with food will help prevent any diarrhea though.  If you get woozy or lightheaded or very short of breath or if you begin coughing up more blood please return immediately.

## 2022-10-04 NOTE — ED Provider Notes (Signed)
[facility] Provider Note    Event Date/Time   First MD Initiated Contact with Patient 10/04/22 [id number]     (approximate)   History   Hemoptysis   HPI  Kaisen Ackers is a 56 y.o. male with a history of esophageal cancer.  He had an esophageal stent taken out 8 days ago and was told he could spit up small amounts of blood for 2 or 3 days.  Its been 8 days and he is coughing up fairly large clumps of bright red or dark red blood.  He says when he lays down to sleep at night he cannot sleep because he is constantly coughing up blood.  Coughed up about a tablespoon of blood here in the emergency room after I examined him.      Physical Exam   Triage Vital Signs: ED Triage Vitals  Enc Vitals Group     BP 10/04/22 0904 126/73     Pulse Rate 10/04/22 0904 88     Resp 10/04/22 0904 18     Temp 10/04/22 0904 98.4 F (36.9 C)     Temp Source 10/04/22 0904 Oral     SpO2 10/04/22 0904 95 %     Weight --      Height --      Head Circumference --      Peak Flow --      Pain Score 10/04/22 0905 5     Pain Loc --      Pain Edu? --      Excl. in [place]? --     Most recent vital signs: Vitals:   10/04/22 1159 10/04/22 1200  BP: (!) 100/50 (!) 100/49  Pulse: 90 87  Resp: 18 18  Temp: 99.3 F (37.4 C)   SpO2: 98% 98%     General: Awake, no distress.  CV:  Good peripheral perfusion.  Heart regular rate and rhythm no audible murmurs Resp:  Normal effort.  Lungs sound clear Abd:  No distention.  Soft and nontender    ED Results / Procedures / Treatments   Labs (all labs ordered are listed, but only abnormal results are displayed) Labs Reviewed  CBC WITH DIFFERENTIAL/PLATELET - Abnormal; Notable for the following components:      Result Value   RBC 3.77 (*)    Hemoglobin 10.6 (*)    HCT 34.5 (*)    RDW 16.7 (*)    All other components within normal limits  COMPREHENSIVE METABOLIC PANEL - Abnormal; Notable for the following components:    Glucose, Bld 117 (*)    Total Protein 8.6 (*)    ALT 63 (*)    Total Bilirubin <0.1 (*)    All other components within normal limits  CBG MONITORING, ED  TYPE AND SCREEN     EKG     RADIOLOGY  CT shows possible small aspiration and stents apparent progression of his cancer but no obvious acute other pathology going on.  PROCEDURES:  Critical Care performed:   Procedures   MEDICATIONS ORDERED IN ED: Medications  0.9 %  sodium chloride infusion ( Intravenous New Bag/Given 10/04/22 1000)  iohexol (OMNIPAQUE) 300 MG/ML solution 75 mL (75 mLs Intravenous Contrast Given 10/04/22 1015)  morphine (PF) 4 MG/ML injection 6 mg (6 mg Intravenous Given 10/04/22 1148)     IMPRESSION / MDM / ASSESSMENT AND PLAN / ED COURSE  I reviewed the triage vital signs and the nursing notes. ----------------------------------------- 12:11 PM on 10/04/2022 -----------------------------------------  Discussed patient with Dr.  Francee Gentile, pulmonary.  Patient's H&H is stable his white count stable his GFR stable I read the CT report to him.  He feels that the patient is fairly stable and recommends Augmentin and follow-up with his doctors at [facility].  I am attempting to contact [facility] to let them know how the patient is doing and see if they can follow-up with him relatively quickly. Blood pressures 117/60.   Patient's presentation is most consistent with severe exacerbation of chronic illness.  The patient is on the cardiac monitor to evaluate for evidence of arrhythmia and/or significant heart rate changes.  None have been seen    FINAL CLINICAL IMPRESSION(S) / ED DIAGNOSES   Final diagnoses:  Hemoptysis     Rx / DC Orders   ED Discharge Orders          Ordered    amoxicillin-clavulanate (AUGMENTIN) 875-125 MG tablet  2 times daily        10/04/22 1240             Note:  This document was prepared using Dragon voice recognition software and may include unintentional dictation  errors.   Nena Polio, MD 10/04/22 1240

## 2022-10-04 NOTE — ED Triage Notes (Signed)
Patient to ED via POV for coughing up blood. Patient states that he had a esophageal stent removed 8 days ago and they told him he would be coughing up blood for the next 3 days but has not stopped. Patient states the "it's a lot of blood and more than normal." Unable to sleeping due to coughing up blood. Patient states he tried to follow up with doctor but is waiting for a call back.

## 2022-10-04 NOTE — ED Notes (Signed)
Pt requesting CBG. EDT at bedside with glucometer

## 2022-10-08 DIAGNOSIS — J860 Pyothorax with fistula: Secondary | ICD-10-CM | POA: Diagnosis not present

## 2022-10-08 DIAGNOSIS — C159 Malignant neoplasm of esophagus, unspecified: Secondary | ICD-10-CM | POA: Diagnosis not present

## 2022-10-09 DIAGNOSIS — C159 Malignant neoplasm of esophagus, unspecified: Secondary | ICD-10-CM | POA: Diagnosis not present

## 2022-10-09 DIAGNOSIS — J860 Pyothorax with fistula: Secondary | ICD-10-CM | POA: Diagnosis not present

## 2022-10-09 DIAGNOSIS — Z923 Personal history of irradiation: Secondary | ICD-10-CM | POA: Diagnosis not present

## 2022-10-09 DIAGNOSIS — Z8501 Personal history of malignant neoplasm of esophagus: Secondary | ICD-10-CM | POA: Diagnosis not present

## 2022-10-09 DIAGNOSIS — Z9221 Personal history of antineoplastic chemotherapy: Secondary | ICD-10-CM | POA: Diagnosis not present

## 2022-10-15 DIAGNOSIS — R918 Other nonspecific abnormal finding of lung field: Secondary | ICD-10-CM | POA: Diagnosis not present

## 2022-10-15 DIAGNOSIS — R042 Hemoptysis: Secondary | ICD-10-CM | POA: Diagnosis not present

## 2022-10-15 DIAGNOSIS — R590 Localized enlarged lymph nodes: Secondary | ICD-10-CM | POA: Diagnosis not present

## 2022-10-16 DIAGNOSIS — J852 Abscess of lung without pneumonia: Secondary | ICD-10-CM | POA: Diagnosis not present

## 2022-10-16 DIAGNOSIS — Z4689 Encounter for fitting and adjustment of other specified devices: Secondary | ICD-10-CM | POA: Diagnosis not present

## 2022-10-16 DIAGNOSIS — J189 Pneumonia, unspecified organism: Secondary | ICD-10-CM | POA: Diagnosis not present

## 2022-10-16 DIAGNOSIS — R042 Hemoptysis: Secondary | ICD-10-CM | POA: Diagnosis not present

## 2022-10-18 DIAGNOSIS — R918 Other nonspecific abnormal finding of lung field: Secondary | ICD-10-CM | POA: Diagnosis not present

## 2022-10-21 DIAGNOSIS — R042 Hemoptysis: Secondary | ICD-10-CM | POA: Diagnosis not present

## 2022-10-21 DIAGNOSIS — E118 Type 2 diabetes mellitus with unspecified complications: Secondary | ICD-10-CM | POA: Diagnosis not present

## 2022-10-22 DIAGNOSIS — J860 Pyothorax with fistula: Secondary | ICD-10-CM | POA: Diagnosis not present

## 2022-10-22 DIAGNOSIS — C159 Malignant neoplasm of esophagus, unspecified: Secondary | ICD-10-CM | POA: Diagnosis not present

## 2022-10-22 DIAGNOSIS — R042 Hemoptysis: Secondary | ICD-10-CM | POA: Diagnosis not present

## 2022-10-22 DIAGNOSIS — R9389 Abnormal findings on diagnostic imaging of other specified body structures: Secondary | ICD-10-CM | POA: Diagnosis not present

## 2022-10-22 DIAGNOSIS — R739 Hyperglycemia, unspecified: Secondary | ICD-10-CM | POA: Diagnosis not present

## 2022-10-25 DIAGNOSIS — E119 Type 2 diabetes mellitus without complications: Secondary | ICD-10-CM | POA: Diagnosis not present

## 2022-10-25 DIAGNOSIS — R9389 Abnormal findings on diagnostic imaging of other specified body structures: Secondary | ICD-10-CM | POA: Diagnosis not present

## 2022-10-25 DIAGNOSIS — R042 Hemoptysis: Secondary | ICD-10-CM | POA: Diagnosis not present

## 2022-10-25 DIAGNOSIS — D630 Anemia in neoplastic disease: Secondary | ICD-10-CM | POA: Diagnosis not present

## 2022-10-25 DIAGNOSIS — Z934 Other artificial openings of gastrointestinal tract status: Secondary | ICD-10-CM | POA: Diagnosis not present

## 2022-10-25 DIAGNOSIS — C160 Malignant neoplasm of cardia: Secondary | ICD-10-CM | POA: Diagnosis not present

## 2022-10-25 DIAGNOSIS — Z1389 Encounter for screening for other disorder: Secondary | ICD-10-CM | POA: Diagnosis not present

## 2022-10-25 DIAGNOSIS — Z87891 Personal history of nicotine dependence: Secondary | ICD-10-CM | POA: Diagnosis not present

## 2022-10-25 DIAGNOSIS — J9809 Other diseases of bronchus, not elsewhere classified: Secondary | ICD-10-CM | POA: Diagnosis not present

## 2022-10-25 DIAGNOSIS — G8929 Other chronic pain: Secondary | ICD-10-CM | POA: Diagnosis not present

## 2022-10-25 DIAGNOSIS — J860 Pyothorax with fistula: Secondary | ICD-10-CM | POA: Diagnosis not present

## 2022-10-25 DIAGNOSIS — Z9889 Other specified postprocedural states: Secondary | ICD-10-CM | POA: Diagnosis not present

## 2022-10-25 DIAGNOSIS — G4700 Insomnia, unspecified: Secondary | ICD-10-CM | POA: Diagnosis not present

## 2022-10-25 DIAGNOSIS — Z79899 Other long term (current) drug therapy: Secondary | ICD-10-CM | POA: Diagnosis not present

## 2022-10-25 DIAGNOSIS — R918 Other nonspecific abnormal finding of lung field: Secondary | ICD-10-CM | POA: Diagnosis not present

## 2022-10-25 DIAGNOSIS — R590 Localized enlarged lymph nodes: Secondary | ICD-10-CM | POA: Diagnosis not present

## 2022-10-25 DIAGNOSIS — R0489 Hemorrhage from other sites in respiratory passages: Secondary | ICD-10-CM | POA: Diagnosis not present

## 2022-11-06 DIAGNOSIS — C159 Malignant neoplasm of esophagus, unspecified: Secondary | ICD-10-CM | POA: Diagnosis not present

## 2022-11-06 DIAGNOSIS — J860 Pyothorax with fistula: Secondary | ICD-10-CM | POA: Diagnosis not present

## 2022-11-06 DIAGNOSIS — Z8501 Personal history of malignant neoplasm of esophagus: Secondary | ICD-10-CM | POA: Diagnosis not present

## 2022-11-06 DIAGNOSIS — Z01818 Encounter for other preprocedural examination: Secondary | ICD-10-CM | POA: Diagnosis not present

## 2022-11-06 DIAGNOSIS — E119 Type 2 diabetes mellitus without complications: Secondary | ICD-10-CM | POA: Diagnosis not present

## 2022-11-06 DIAGNOSIS — R042 Hemoptysis: Secondary | ICD-10-CM | POA: Diagnosis not present

## 2022-11-07 DIAGNOSIS — R0789 Other chest pain: Secondary | ICD-10-CM | POA: Diagnosis not present

## 2022-11-07 DIAGNOSIS — R042 Hemoptysis: Secondary | ICD-10-CM | POA: Diagnosis not present

## 2022-11-07 DIAGNOSIS — R6889 Other general symptoms and signs: Secondary | ICD-10-CM | POA: Diagnosis not present

## 2022-11-07 DIAGNOSIS — C159 Malignant neoplasm of esophagus, unspecified: Secondary | ICD-10-CM | POA: Diagnosis not present

## 2022-11-07 DIAGNOSIS — G8918 Other acute postprocedural pain: Secondary | ICD-10-CM | POA: Diagnosis not present

## 2022-11-08 DIAGNOSIS — J984 Other disorders of lung: Secondary | ICD-10-CM | POA: Diagnosis not present

## 2022-11-08 DIAGNOSIS — J8489 Other specified interstitial pulmonary diseases: Secondary | ICD-10-CM | POA: Diagnosis not present

## 2022-11-08 DIAGNOSIS — R918 Other nonspecific abnormal finding of lung field: Secondary | ICD-10-CM | POA: Diagnosis not present

## 2022-11-08 DIAGNOSIS — J851 Abscess of lung with pneumonia: Secondary | ICD-10-CM | POA: Diagnosis not present

## 2022-11-08 DIAGNOSIS — Z923 Personal history of irradiation: Secondary | ICD-10-CM | POA: Diagnosis not present

## 2022-11-08 DIAGNOSIS — K9189 Other postprocedural complications and disorders of digestive system: Secondary | ICD-10-CM | POA: Diagnosis not present

## 2022-11-08 DIAGNOSIS — J189 Pneumonia, unspecified organism: Secondary | ICD-10-CM | POA: Diagnosis not present

## 2022-11-08 DIAGNOSIS — J8410 Pulmonary fibrosis, unspecified: Secondary | ICD-10-CM | POA: Diagnosis not present

## 2022-11-08 DIAGNOSIS — D649 Anemia, unspecified: Secondary | ICD-10-CM | POA: Diagnosis not present

## 2022-11-08 DIAGNOSIS — C159 Malignant neoplasm of esophagus, unspecified: Secondary | ICD-10-CM | POA: Diagnosis not present

## 2022-11-08 DIAGNOSIS — G8918 Other acute postprocedural pain: Secondary | ICD-10-CM | POA: Diagnosis not present

## 2022-11-08 DIAGNOSIS — R042 Hemoptysis: Secondary | ICD-10-CM | POA: Diagnosis not present

## 2022-11-08 DIAGNOSIS — E119 Type 2 diabetes mellitus without complications: Secondary | ICD-10-CM | POA: Diagnosis not present

## 2022-11-08 DIAGNOSIS — Z8501 Personal history of malignant neoplasm of esophagus: Secondary | ICD-10-CM | POA: Diagnosis not present

## 2022-11-08 DIAGNOSIS — Z9221 Personal history of antineoplastic chemotherapy: Secondary | ICD-10-CM | POA: Diagnosis not present

## 2022-11-08 DIAGNOSIS — Z4682 Encounter for fitting and adjustment of non-vascular catheter: Secondary | ICD-10-CM | POA: Diagnosis not present

## 2022-11-09 DIAGNOSIS — J9811 Atelectasis: Secondary | ICD-10-CM | POA: Diagnosis not present

## 2022-11-09 DIAGNOSIS — C159 Malignant neoplasm of esophagus, unspecified: Secondary | ICD-10-CM | POA: Diagnosis not present

## 2022-11-09 DIAGNOSIS — T85598S Other mechanical complication of other gastrointestinal prosthetic devices, implants and grafts, sequela: Secondary | ICD-10-CM | POA: Diagnosis not present

## 2022-11-09 DIAGNOSIS — Z794 Long term (current) use of insulin: Secondary | ICD-10-CM | POA: Diagnosis not present

## 2022-11-09 DIAGNOSIS — G8918 Other acute postprocedural pain: Secondary | ICD-10-CM | POA: Diagnosis not present

## 2022-11-09 DIAGNOSIS — I2699 Other pulmonary embolism without acute cor pulmonale: Secondary | ICD-10-CM | POA: Diagnosis not present

## 2022-11-09 DIAGNOSIS — E119 Type 2 diabetes mellitus without complications: Secondary | ICD-10-CM | POA: Diagnosis not present

## 2022-11-10 DIAGNOSIS — R918 Other nonspecific abnormal finding of lung field: Secondary | ICD-10-CM | POA: Diagnosis not present

## 2022-11-10 DIAGNOSIS — Z4682 Encounter for fitting and adjustment of non-vascular catheter: Secondary | ICD-10-CM | POA: Diagnosis not present

## 2022-11-10 DIAGNOSIS — G894 Chronic pain syndrome: Secondary | ICD-10-CM | POA: Diagnosis not present

## 2022-11-10 DIAGNOSIS — R0789 Other chest pain: Secondary | ICD-10-CM | POA: Diagnosis not present

## 2022-11-10 DIAGNOSIS — T797XXA Traumatic subcutaneous emphysema, initial encounter: Secondary | ICD-10-CM | POA: Diagnosis not present

## 2022-11-10 DIAGNOSIS — G8918 Other acute postprocedural pain: Secondary | ICD-10-CM | POA: Diagnosis not present

## 2022-11-10 DIAGNOSIS — C159 Malignant neoplasm of esophagus, unspecified: Secondary | ICD-10-CM | POA: Diagnosis not present

## 2022-11-10 DIAGNOSIS — R69 Illness, unspecified: Secondary | ICD-10-CM | POA: Diagnosis not present

## 2022-11-11 DIAGNOSIS — R0789 Other chest pain: Secondary | ICD-10-CM | POA: Diagnosis not present

## 2022-11-11 DIAGNOSIS — G894 Chronic pain syndrome: Secondary | ICD-10-CM | POA: Diagnosis not present

## 2022-11-11 DIAGNOSIS — C159 Malignant neoplasm of esophagus, unspecified: Secondary | ICD-10-CM | POA: Diagnosis not present

## 2022-11-11 DIAGNOSIS — G8918 Other acute postprocedural pain: Secondary | ICD-10-CM | POA: Diagnosis not present

## 2022-11-11 DIAGNOSIS — R69 Illness, unspecified: Secondary | ICD-10-CM | POA: Diagnosis not present

## 2022-11-12 DIAGNOSIS — G894 Chronic pain syndrome: Secondary | ICD-10-CM | POA: Diagnosis not present

## 2022-11-12 DIAGNOSIS — J90 Pleural effusion, not elsewhere classified: Secondary | ICD-10-CM | POA: Diagnosis not present

## 2022-11-12 DIAGNOSIS — R140 Abdominal distension (gaseous): Secondary | ICD-10-CM | POA: Diagnosis not present

## 2022-11-12 DIAGNOSIS — C159 Malignant neoplasm of esophagus, unspecified: Secondary | ICD-10-CM | POA: Diagnosis not present

## 2022-11-12 DIAGNOSIS — G8918 Other acute postprocedural pain: Secondary | ICD-10-CM | POA: Diagnosis not present

## 2022-11-12 DIAGNOSIS — R0789 Other chest pain: Secondary | ICD-10-CM | POA: Diagnosis not present

## 2022-11-12 DIAGNOSIS — Z789 Other specified health status: Secondary | ICD-10-CM | POA: Diagnosis not present

## 2022-11-12 DIAGNOSIS — R69 Illness, unspecified: Secondary | ICD-10-CM | POA: Diagnosis not present

## 2022-11-12 DIAGNOSIS — E1165 Type 2 diabetes mellitus with hyperglycemia: Secondary | ICD-10-CM | POA: Diagnosis not present

## 2022-11-12 DIAGNOSIS — T797XXA Traumatic subcutaneous emphysema, initial encounter: Secondary | ICD-10-CM | POA: Diagnosis not present

## 2022-11-12 DIAGNOSIS — J939 Pneumothorax, unspecified: Secondary | ICD-10-CM | POA: Diagnosis not present

## 2022-11-12 DIAGNOSIS — J948 Other specified pleural conditions: Secondary | ICD-10-CM | POA: Diagnosis not present

## 2022-11-12 DIAGNOSIS — R918 Other nonspecific abnormal finding of lung field: Secondary | ICD-10-CM | POA: Diagnosis not present

## 2022-11-12 DIAGNOSIS — R590 Localized enlarged lymph nodes: Secondary | ICD-10-CM | POA: Diagnosis not present

## 2022-11-12 DIAGNOSIS — J9811 Atelectasis: Secondary | ICD-10-CM | POA: Diagnosis not present

## 2022-11-13 DIAGNOSIS — C159 Malignant neoplasm of esophagus, unspecified: Secondary | ICD-10-CM | POA: Diagnosis not present

## 2022-11-13 DIAGNOSIS — G894 Chronic pain syndrome: Secondary | ICD-10-CM | POA: Diagnosis not present

## 2022-11-13 DIAGNOSIS — G8918 Other acute postprocedural pain: Secondary | ICD-10-CM | POA: Diagnosis not present

## 2022-11-13 DIAGNOSIS — J939 Pneumothorax, unspecified: Secondary | ICD-10-CM | POA: Diagnosis not present

## 2022-11-13 DIAGNOSIS — R918 Other nonspecific abnormal finding of lung field: Secondary | ICD-10-CM | POA: Diagnosis not present

## 2022-11-13 DIAGNOSIS — R0789 Other chest pain: Secondary | ICD-10-CM | POA: Diagnosis not present

## 2022-11-13 DIAGNOSIS — J948 Other specified pleural conditions: Secondary | ICD-10-CM | POA: Diagnosis not present

## 2022-11-13 DIAGNOSIS — T797XXA Traumatic subcutaneous emphysema, initial encounter: Secondary | ICD-10-CM | POA: Diagnosis not present

## 2022-11-13 DIAGNOSIS — R69 Illness, unspecified: Secondary | ICD-10-CM | POA: Diagnosis not present

## 2022-11-13 DIAGNOSIS — J9811 Atelectasis: Secondary | ICD-10-CM | POA: Diagnosis not present

## 2022-11-13 DIAGNOSIS — E1165 Type 2 diabetes mellitus with hyperglycemia: Secondary | ICD-10-CM | POA: Diagnosis not present

## 2022-11-13 DIAGNOSIS — R590 Localized enlarged lymph nodes: Secondary | ICD-10-CM | POA: Diagnosis not present

## 2022-11-13 DIAGNOSIS — J90 Pleural effusion, not elsewhere classified: Secondary | ICD-10-CM | POA: Diagnosis not present

## 2022-11-13 DIAGNOSIS — Z789 Other specified health status: Secondary | ICD-10-CM | POA: Diagnosis not present

## 2022-11-14 DIAGNOSIS — G8918 Other acute postprocedural pain: Secondary | ICD-10-CM | POA: Diagnosis not present

## 2022-11-14 DIAGNOSIS — J90 Pleural effusion, not elsewhere classified: Secondary | ICD-10-CM | POA: Diagnosis not present

## 2022-11-14 DIAGNOSIS — Z4682 Encounter for fitting and adjustment of non-vascular catheter: Secondary | ICD-10-CM | POA: Diagnosis not present

## 2022-11-14 DIAGNOSIS — R69 Illness, unspecified: Secondary | ICD-10-CM | POA: Diagnosis not present

## 2022-11-14 DIAGNOSIS — Z8501 Personal history of malignant neoplasm of esophagus: Secondary | ICD-10-CM | POA: Diagnosis not present

## 2022-11-14 DIAGNOSIS — Z789 Other specified health status: Secondary | ICD-10-CM | POA: Diagnosis not present

## 2022-11-14 DIAGNOSIS — K6389 Other specified diseases of intestine: Secondary | ICD-10-CM | POA: Diagnosis not present

## 2022-11-14 DIAGNOSIS — E1165 Type 2 diabetes mellitus with hyperglycemia: Secondary | ICD-10-CM | POA: Diagnosis not present

## 2022-11-14 DIAGNOSIS — R0789 Other chest pain: Secondary | ICD-10-CM | POA: Diagnosis not present

## 2022-11-14 DIAGNOSIS — J939 Pneumothorax, unspecified: Secondary | ICD-10-CM | POA: Diagnosis not present

## 2022-11-14 DIAGNOSIS — G894 Chronic pain syndrome: Secondary | ICD-10-CM | POA: Diagnosis not present

## 2022-11-14 DIAGNOSIS — R918 Other nonspecific abnormal finding of lung field: Secondary | ICD-10-CM | POA: Diagnosis not present

## 2022-11-14 DIAGNOSIS — C159 Malignant neoplasm of esophagus, unspecified: Secondary | ICD-10-CM | POA: Diagnosis not present

## 2022-11-15 DIAGNOSIS — R69 Illness, unspecified: Secondary | ICD-10-CM | POA: Diagnosis not present

## 2022-11-15 DIAGNOSIS — R0789 Other chest pain: Secondary | ICD-10-CM | POA: Diagnosis not present

## 2022-11-15 DIAGNOSIS — G8918 Other acute postprocedural pain: Secondary | ICD-10-CM | POA: Diagnosis not present

## 2022-11-15 DIAGNOSIS — Z789 Other specified health status: Secondary | ICD-10-CM | POA: Diagnosis not present

## 2022-11-15 DIAGNOSIS — C159 Malignant neoplasm of esophagus, unspecified: Secondary | ICD-10-CM | POA: Diagnosis not present

## 2022-11-15 DIAGNOSIS — E1165 Type 2 diabetes mellitus with hyperglycemia: Secondary | ICD-10-CM | POA: Diagnosis not present

## 2022-11-15 DIAGNOSIS — G894 Chronic pain syndrome: Secondary | ICD-10-CM | POA: Diagnosis not present

## 2022-11-16 DIAGNOSIS — E1165 Type 2 diabetes mellitus with hyperglycemia: Secondary | ICD-10-CM | POA: Diagnosis not present

## 2022-11-16 DIAGNOSIS — R918 Other nonspecific abnormal finding of lung field: Secondary | ICD-10-CM | POA: Diagnosis not present

## 2022-11-16 DIAGNOSIS — T797XXA Traumatic subcutaneous emphysema, initial encounter: Secondary | ICD-10-CM | POA: Diagnosis not present

## 2022-11-16 DIAGNOSIS — R1084 Generalized abdominal pain: Secondary | ICD-10-CM | POA: Diagnosis not present

## 2022-11-16 DIAGNOSIS — J939 Pneumothorax, unspecified: Secondary | ICD-10-CM | POA: Diagnosis not present

## 2022-11-16 DIAGNOSIS — J948 Other specified pleural conditions: Secondary | ICD-10-CM | POA: Diagnosis not present

## 2022-11-16 DIAGNOSIS — R188 Other ascites: Secondary | ICD-10-CM | POA: Diagnosis not present

## 2022-11-16 DIAGNOSIS — Z4682 Encounter for fitting and adjustment of non-vascular catheter: Secondary | ICD-10-CM | POA: Diagnosis not present

## 2022-11-16 DIAGNOSIS — C159 Malignant neoplasm of esophagus, unspecified: Secondary | ICD-10-CM | POA: Diagnosis not present

## 2022-11-16 DIAGNOSIS — J9811 Atelectasis: Secondary | ICD-10-CM | POA: Diagnosis not present

## 2022-11-16 DIAGNOSIS — J90 Pleural effusion, not elsewhere classified: Secondary | ICD-10-CM | POA: Diagnosis not present

## 2022-11-16 DIAGNOSIS — Z789 Other specified health status: Secondary | ICD-10-CM | POA: Diagnosis not present

## 2022-11-16 DIAGNOSIS — R590 Localized enlarged lymph nodes: Secondary | ICD-10-CM | POA: Diagnosis not present

## 2022-11-19 DIAGNOSIS — Z789 Other specified health status: Secondary | ICD-10-CM | POA: Diagnosis not present

## 2022-11-19 DIAGNOSIS — E1165 Type 2 diabetes mellitus with hyperglycemia: Secondary | ICD-10-CM | POA: Diagnosis not present

## 2022-11-20 DIAGNOSIS — E1165 Type 2 diabetes mellitus with hyperglycemia: Secondary | ICD-10-CM | POA: Diagnosis not present

## 2022-11-20 DIAGNOSIS — Z789 Other specified health status: Secondary | ICD-10-CM | POA: Diagnosis not present

## 2022-11-21 DIAGNOSIS — J9811 Atelectasis: Secondary | ICD-10-CM | POA: Diagnosis not present

## 2022-11-21 DIAGNOSIS — J90 Pleural effusion, not elsewhere classified: Secondary | ICD-10-CM | POA: Diagnosis not present

## 2022-11-21 DIAGNOSIS — E1165 Type 2 diabetes mellitus with hyperglycemia: Secondary | ICD-10-CM | POA: Diagnosis not present

## 2022-11-21 DIAGNOSIS — C159 Malignant neoplasm of esophagus, unspecified: Secondary | ICD-10-CM | POA: Diagnosis not present

## 2022-11-21 DIAGNOSIS — Z789 Other specified health status: Secondary | ICD-10-CM | POA: Diagnosis not present

## 2022-11-21 DIAGNOSIS — R918 Other nonspecific abnormal finding of lung field: Secondary | ICD-10-CM | POA: Diagnosis not present

## 2022-11-22 DIAGNOSIS — E1165 Type 2 diabetes mellitus with hyperglycemia: Secondary | ICD-10-CM | POA: Diagnosis not present

## 2022-11-22 DIAGNOSIS — Z789 Other specified health status: Secondary | ICD-10-CM | POA: Diagnosis not present

## 2022-11-24 DIAGNOSIS — E785 Hyperlipidemia, unspecified: Secondary | ICD-10-CM | POA: Diagnosis not present

## 2022-11-24 DIAGNOSIS — J860 Pyothorax with fistula: Secondary | ICD-10-CM | POA: Diagnosis not present

## 2022-11-24 DIAGNOSIS — Z9689 Presence of other specified functional implants: Secondary | ICD-10-CM | POA: Diagnosis not present

## 2022-11-24 DIAGNOSIS — R042 Hemoptysis: Secondary | ICD-10-CM | POA: Diagnosis not present

## 2022-11-24 DIAGNOSIS — Z9221 Personal history of antineoplastic chemotherapy: Secondary | ICD-10-CM | POA: Diagnosis not present

## 2022-11-24 DIAGNOSIS — Z431 Encounter for attention to gastrostomy: Secondary | ICD-10-CM | POA: Diagnosis not present

## 2022-11-24 DIAGNOSIS — Z794 Long term (current) use of insulin: Secondary | ICD-10-CM | POA: Diagnosis not present

## 2022-11-24 DIAGNOSIS — Z9049 Acquired absence of other specified parts of digestive tract: Secondary | ICD-10-CM | POA: Diagnosis not present

## 2022-11-24 DIAGNOSIS — Z8501 Personal history of malignant neoplasm of esophagus: Secondary | ICD-10-CM | POA: Diagnosis not present

## 2022-11-24 DIAGNOSIS — Z4803 Encounter for change or removal of drains: Secondary | ICD-10-CM | POA: Diagnosis not present

## 2022-11-24 DIAGNOSIS — K9189 Other postprocedural complications and disorders of digestive system: Secondary | ICD-10-CM | POA: Diagnosis not present

## 2022-11-24 DIAGNOSIS — E1140 Type 2 diabetes mellitus with diabetic neuropathy, unspecified: Secondary | ICD-10-CM | POA: Diagnosis not present

## 2022-11-27 DIAGNOSIS — Z934 Other artificial openings of gastrointestinal tract status: Secondary | ICD-10-CM

## 2022-11-27 DIAGNOSIS — J860 Pyothorax with fistula: Secondary | ICD-10-CM | POA: Diagnosis not present

## 2022-11-27 DIAGNOSIS — R042 Hemoptysis: Secondary | ICD-10-CM | POA: Diagnosis not present

## 2022-11-27 DIAGNOSIS — Z8501 Personal history of malignant neoplasm of esophagus: Secondary | ICD-10-CM | POA: Diagnosis not present

## 2022-11-27 DIAGNOSIS — C159 Malignant neoplasm of esophagus, unspecified: Secondary | ICD-10-CM | POA: Diagnosis not present

## 2022-11-28 DIAGNOSIS — R042 Hemoptysis: Secondary | ICD-10-CM | POA: Diagnosis not present

## 2022-11-28 DIAGNOSIS — Z9221 Personal history of antineoplastic chemotherapy: Secondary | ICD-10-CM | POA: Diagnosis not present

## 2022-11-28 DIAGNOSIS — Z9049 Acquired absence of other specified parts of digestive tract: Secondary | ICD-10-CM | POA: Diagnosis not present

## 2022-11-28 DIAGNOSIS — E785 Hyperlipidemia, unspecified: Secondary | ICD-10-CM | POA: Diagnosis not present

## 2022-11-28 DIAGNOSIS — Z431 Encounter for attention to gastrostomy: Secondary | ICD-10-CM | POA: Diagnosis not present

## 2022-11-28 DIAGNOSIS — K9189 Other postprocedural complications and disorders of digestive system: Secondary | ICD-10-CM | POA: Diagnosis not present

## 2022-11-28 DIAGNOSIS — J860 Pyothorax with fistula: Secondary | ICD-10-CM | POA: Diagnosis not present

## 2022-11-28 DIAGNOSIS — Z4803 Encounter for change or removal of drains: Secondary | ICD-10-CM | POA: Diagnosis not present

## 2022-11-28 DIAGNOSIS — Z794 Long term (current) use of insulin: Secondary | ICD-10-CM | POA: Diagnosis not present

## 2022-11-28 DIAGNOSIS — Z8501 Personal history of malignant neoplasm of esophagus: Secondary | ICD-10-CM | POA: Diagnosis not present

## 2022-11-28 DIAGNOSIS — Z9689 Presence of other specified functional implants: Secondary | ICD-10-CM | POA: Diagnosis not present

## 2022-11-28 DIAGNOSIS — E1140 Type 2 diabetes mellitus with diabetic neuropathy, unspecified: Secondary | ICD-10-CM | POA: Diagnosis not present

## 2022-12-04 DIAGNOSIS — Z431 Encounter for attention to gastrostomy: Secondary | ICD-10-CM | POA: Diagnosis not present

## 2022-12-04 DIAGNOSIS — K9189 Other postprocedural complications and disorders of digestive system: Secondary | ICD-10-CM | POA: Diagnosis not present

## 2022-12-04 DIAGNOSIS — Z4803 Encounter for change or removal of drains: Secondary | ICD-10-CM | POA: Diagnosis not present

## 2022-12-04 DIAGNOSIS — E1140 Type 2 diabetes mellitus with diabetic neuropathy, unspecified: Secondary | ICD-10-CM | POA: Diagnosis not present

## 2022-12-04 DIAGNOSIS — R042 Hemoptysis: Secondary | ICD-10-CM | POA: Diagnosis not present

## 2022-12-04 DIAGNOSIS — Z794 Long term (current) use of insulin: Secondary | ICD-10-CM | POA: Diagnosis not present

## 2022-12-04 DIAGNOSIS — Z9221 Personal history of antineoplastic chemotherapy: Secondary | ICD-10-CM | POA: Diagnosis not present

## 2022-12-04 DIAGNOSIS — E785 Hyperlipidemia, unspecified: Secondary | ICD-10-CM | POA: Diagnosis not present

## 2022-12-04 DIAGNOSIS — J860 Pyothorax with fistula: Secondary | ICD-10-CM | POA: Diagnosis not present

## 2022-12-04 DIAGNOSIS — Z8501 Personal history of malignant neoplasm of esophagus: Secondary | ICD-10-CM | POA: Diagnosis not present

## 2022-12-04 DIAGNOSIS — Z9049 Acquired absence of other specified parts of digestive tract: Secondary | ICD-10-CM | POA: Diagnosis not present

## 2022-12-04 DIAGNOSIS — Z9689 Presence of other specified functional implants: Secondary | ICD-10-CM | POA: Diagnosis not present

## 2022-12-07 DIAGNOSIS — Z4803 Encounter for change or removal of drains: Secondary | ICD-10-CM | POA: Diagnosis not present

## 2022-12-07 DIAGNOSIS — E785 Hyperlipidemia, unspecified: Secondary | ICD-10-CM | POA: Diagnosis not present

## 2022-12-07 DIAGNOSIS — Z9221 Personal history of antineoplastic chemotherapy: Secondary | ICD-10-CM | POA: Diagnosis not present

## 2022-12-07 DIAGNOSIS — Z9689 Presence of other specified functional implants: Secondary | ICD-10-CM | POA: Diagnosis not present

## 2022-12-07 DIAGNOSIS — E1140 Type 2 diabetes mellitus with diabetic neuropathy, unspecified: Secondary | ICD-10-CM | POA: Diagnosis not present

## 2022-12-07 DIAGNOSIS — Z431 Encounter for attention to gastrostomy: Secondary | ICD-10-CM | POA: Diagnosis not present

## 2022-12-07 DIAGNOSIS — K9189 Other postprocedural complications and disorders of digestive system: Secondary | ICD-10-CM | POA: Diagnosis not present

## 2022-12-07 DIAGNOSIS — Z9049 Acquired absence of other specified parts of digestive tract: Secondary | ICD-10-CM | POA: Diagnosis not present

## 2022-12-07 DIAGNOSIS — Z8501 Personal history of malignant neoplasm of esophagus: Secondary | ICD-10-CM | POA: Diagnosis not present

## 2022-12-07 DIAGNOSIS — R042 Hemoptysis: Secondary | ICD-10-CM | POA: Diagnosis not present

## 2022-12-07 DIAGNOSIS — J860 Pyothorax with fistula: Secondary | ICD-10-CM | POA: Diagnosis not present

## 2022-12-07 DIAGNOSIS — Z794 Long term (current) use of insulin: Secondary | ICD-10-CM | POA: Diagnosis not present

## 2023-01-14 DIAGNOSIS — Z8501 Personal history of malignant neoplasm of esophagus: Secondary | ICD-10-CM | POA: Diagnosis not present

## 2023-01-14 DIAGNOSIS — Z87891 Personal history of nicotine dependence: Secondary | ICD-10-CM | POA: Diagnosis not present

## 2023-01-14 DIAGNOSIS — Z08 Encounter for follow-up examination after completed treatment for malignant neoplasm: Secondary | ICD-10-CM | POA: Diagnosis not present

## 2023-01-14 DIAGNOSIS — Z9049 Acquired absence of other specified parts of digestive tract: Secondary | ICD-10-CM | POA: Diagnosis not present

## 2023-01-22 DIAGNOSIS — K9413 Enterostomy malfunction: Secondary | ICD-10-CM | POA: Diagnosis not present

## 2023-01-22 DIAGNOSIS — C159 Malignant neoplasm of esophagus, unspecified: Secondary | ICD-10-CM | POA: Diagnosis not present

## 2023-01-24 DIAGNOSIS — J860 Pyothorax with fistula: Secondary | ICD-10-CM | POA: Diagnosis not present

## 2023-01-24 DIAGNOSIS — T17508A Unspecified foreign body in bronchus causing other injury, initial encounter: Secondary | ICD-10-CM | POA: Diagnosis not present

## 2023-02-05 DIAGNOSIS — C159 Malignant neoplasm of esophagus, unspecified: Secondary | ICD-10-CM | POA: Diagnosis not present

## 2023-02-05 DIAGNOSIS — Z934 Other artificial openings of gastrointestinal tract status: Secondary | ICD-10-CM | POA: Diagnosis not present

## 2023-02-05 DIAGNOSIS — Z9889 Other specified postprocedural states: Secondary | ICD-10-CM | POA: Diagnosis not present

## 2023-04-19 ENCOUNTER — Inpatient Hospital Stay: Payer: 59 | Attending: Oncology | Admitting: Oncology

## 2023-04-19 ENCOUNTER — Other Ambulatory Visit: Payer: Self-pay | Admitting: *Deleted

## 2023-04-19 ENCOUNTER — Encounter: Payer: Self-pay | Admitting: Oncology

## 2023-04-19 VITALS — BP 116/54 | HR 89 | Temp 97.5°F | Wt 134.6 lb

## 2023-04-19 DIAGNOSIS — Z79899 Other long term (current) drug therapy: Secondary | ICD-10-CM | POA: Diagnosis not present

## 2023-04-19 DIAGNOSIS — R1310 Dysphagia, unspecified: Secondary | ICD-10-CM | POA: Insufficient documentation

## 2023-04-19 DIAGNOSIS — Z8501 Personal history of malignant neoplasm of esophagus: Secondary | ICD-10-CM | POA: Insufficient documentation

## 2023-04-19 DIAGNOSIS — R110 Nausea: Secondary | ICD-10-CM | POA: Insufficient documentation

## 2023-04-19 DIAGNOSIS — Z923 Personal history of irradiation: Secondary | ICD-10-CM | POA: Diagnosis not present

## 2023-04-19 DIAGNOSIS — C159 Malignant neoplasm of esophagus, unspecified: Secondary | ICD-10-CM | POA: Diagnosis not present

## 2023-04-19 MED ORDER — PANTOPRAZOLE SODIUM 40 MG PO TBEC: 40.0000 mg | DELAYED_RELEASE_TABLET | Freq: Two times a day (BID) | ORAL | 5 refills | Status: AC

## 2023-04-19 NOTE — Progress Notes (Signed)
Pt is having severe back pain rates at a 7. Pt. Is still having nausea pretty bad with vomiting sometimes. He is needing a refill on his pantoprazole. And His wound from his surgery on a tumor that was on his right mid abdomen region, has opened back up again and is giving him problems.

## 2023-04-19 NOTE — Progress Notes (Signed)
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  INTERVAL HISTORY: Patient last seen in clinic over 1 year ago.  He underwent surgical resection of his esophageal cancer at [facility] and continues to follow-up postoperatively there.  He is referred back with multiple complaints including difficulty swallowing, increased nausea, and open wound at his surgical site.  He also reports significant weight loss secondary to poor appetite.  He does not complain of pain today.  He has no neurologic complaints.  He denies any recent fevers or illnesses.  He has no chest pain, shortness of breath, cough, or hemoptysis.  He denies any vomiting, constipation, or diarrhea.  He has no melena or hematochezia.  He has no urinary complaints.  Patient offers no further specific complaints today.  REVIEW OF SYSTEMS:   Review of Systems  Constitutional: Negative.  Negative for fever, malaise/fatigue and weight loss.  Respiratory: Negative.  Negative for cough, hemoptysis and shortness of breath.   Cardiovascular: Negative.  Negative for chest pain and leg swelling.  Gastrointestinal:  Positive for heartburn. Negative for abdominal pain, blood in stool and melena.  Genitourinary: Negative.  Negative for dysuria.  Musculoskeletal: Negative.  Negative for back pain.  Skin: Negative.  Negative for rash.  Neurological: Negative.  Negative for dizziness, focal weakness, weakness and headaches.  Psychiatric/Behavioral:  The patient has insomnia. The patient is not nervous/anxious.      As per HPI. Otherwise, a complete review of systems is  negative.  PAST MEDICAL HISTORY: Past Medical History:  Diagnosis Date   Cancer ([facility]) 2022   Diabetes mellitus without complication ([facility])    Hyperlipidemia    Neuromuscular disorder ([facility])     PAST SURGICAL HISTORY: Past Surgical History:  Procedure Laterality Date   COLONOSCOPY WITH PROPOFOL N/A 11/14/2021   Procedure: COLONOSCOPY WITH PROPOFOL;  Surgeon: Regis Bill, MD;  Location: [facility] ENDOSCOPY;  Service: Endoscopy;  Laterality: N/A;   ESOPHAGOGASTRODUODENOSCOPY (EGD) WITH PROPOFOL N/A 11/14/2021   Procedure: ESOPHAGOGASTRODUODENOSCOPY (EGD) WITH PROPOFOL;  Surgeon: Regis Bill, MD;  Location: [facility] ENDOSCOPY;  Service: Endoscopy;  Laterality: N/A;   ESOPHAGOGASTRODUODENOSCOPY (EGD) WITH PROPOFOL N/A 04/19/2022   Procedure: ESOPHAGOGASTRODUODENOSCOPY (EGD) WITH PROPOFOL;  Surgeon: Wyline Mood, MD;  Location: [facility] ENDOSCOPY;  Service: Gastroenterology;  Laterality: N/A;   ESOPHAGOGASTRODUODENOSCOPY (EGD) WITH PROPOFOL N/A 05/01/2022   Procedure: ESOPHAGOGASTRODUODENOSCOPY (EGD) WITH PROPOFOL;  Surgeon: Toney Reil, MD;  Location: [facility] ENDOSCOPY;  Service: Gastroenterology;  Laterality: N/A;   EUS N/A 12/14/2021   Procedure: FULL UPPER ENDOSCOPIC ULTRASOUND (EUS) RADIAL;  Surgeon: Bearl Mulberry, MD;  Location: [facility] ENDOSCOPY;  Service: Gastroenterology;  Laterality: N/A;  SPANISH INTERPRETER    FAMILY HISTORY: Family History  Problem Relation Age of Onset   Hypertension Mother     ADVANCED DIRECTIVES (Y/N):  N  HEALTH MAINTENANCE: Social History   Tobacco Use   Smoking status: Never   Smokeless tobacco: Never  Vaping Use   Vaping Use: Never used  Substance Use Topics   Alcohol use: Yes    Comment: occassionallh   Drug use: Never     Colonoscopy:  PAP:  Bone density:  Lipid panel:  No Known Allergies  Current  Outpatient Medications  Medication Sig Dispense Refill   ALPRAZolam (XANAX) 0.25 MG tablet Take 1 tablet (0.25 mg total) by mouth  at bedtime as needed for anxiety. 30 tablet 0   amoxicillin-clavulanate (AUGMENTIN) 875-125 MG tablet Take 1 tablet by mouth 2 (two) times daily. 20 tablet 0   escitalopram (LEXAPRO) 5 MG tablet Take by mouth.     metFORMIN (GLUCOPHAGE) 1000 MG tablet Take 1,000 mg by mouth daily.     OLANZapine (ZYPREXA) 5 MG tablet Take 1 tablet (5 mg total) by mouth at bedtime as needed (nausea). 30 tablet 1   oxyCODONE (OXY IR/ROXICODONE) 5 MG immediate release tablet Take 10 mg by mouth every 6 (six) hours as needed for pain.     pantoprazole (PROTONIX) 40 MG tablet Take 1 tablet (40 mg total) by mouth 2 (two) times daily before a meal. 60 tablet 0   polyethylene glycol (MIRALAX / GLYCOLAX) 17 g packet Take 17 g by mouth daily as needed for severe constipation. 14 each 0   acetaminophen (TYLENOL) 325 MG tablet Take 2 tablets (650 mg total) by mouth every 6 (six) hours as needed for mild pain or fever. (Patient not taking: Reported on 04/19/2023)     atorvastatin (LIPITOR) 40 MG tablet TOMA UNA TABLETA POR BOCA AL ACOSTARSE PARA COLESTEROL ALTO (Patient not taking: Reported on 04/19/2023) 90 tablet 3   ferrous sulfate 325 (65 FE) MG tablet Take 1 tablet (325 mg total) by mouth daily with breakfast. (Patient not taking: Reported on 04/19/2023) 30 tablet 0   glipiZIDE (GLUCOTROL XL) 5 MG 24 hr tablet TOME 1 TABLETA POR BOCA DIARIO PARA LA DIABETES (Patient not taking: Reported on 04/19/2023) 90 tablet 1   sucralfate (CARAFATE) 1 g tablet Take 1 tablet (1 g total) by mouth 4 (four) times daily -  with meals and at bedtime. (Patient not taking: Reported on 04/18/2022) 60 tablet 0   No current facility-administered medications for this visit.    OBJECTIVE: Vitals:   04/19/23 1116  BP: (!) 116/54  Pulse: 89  Temp: (!) 97.5 F (36.4 C)  SpO2: 97%       Body mass index is 22.4 kg/m.    ECOG FS:1 - Symptomatic but completely ambulatory  General: Well-developed, well-nourished, no acute distress. Eyes: Pink  conjunctiva, anicteric sclera. HEENT: Normocephalic, moist mucous membranes. Lungs: No audible wheezing or coughing. Heart: Regular rate and rhythm. Abdomen: Soft, nontender, no obvious distention. Musculoskeletal: No edema, cyanosis, or clubbing. Neuro: Alert, answering all questions appropriately. Cranial nerves grossly intact. Skin: No rashes or petechiae noted.  Open wound at surgical site on patient's right flank and appears to be healing by secondary intention no erythema noted. Psych: Normal affect.   LAB RESULTS:  Lab Results  Component Value Date   NA 139 10/04/2022   K 3.9 10/04/2022   CL 101 10/04/2022   CO2 31 10/04/2022   GLUCOSE 117 (H) 10/04/2022   BUN 18 10/04/2022   CREATININE 0.72 10/04/2022   CALCIUM 9.1 10/04/2022   PROT 8.6 (H) 10/04/2022   ALBUMIN 3.7 10/04/2022   AST 30 10/04/2022   ALT 63 (H) 10/04/2022   ALKPHOS 122 10/04/2022   BILITOT <0.1 (L) 10/04/2022   GFRNONAA >60 10/04/2022    Lab Results  Component Value Date   WBC 7.2 10/04/2022   NEUTROABS 5.5 10/04/2022   HGB 10.6 (L) 10/04/2022   HCT 34.5 (L) 10/04/2022   MCV 91.5 10/04/2022   PLT 230 10/04/2022   Lab Results  Component Value Date   IRON 28 (L) 04/18/2022   TIBC 333 04/18/2022   IRONPCTSAT 8 (L) 04/18/2022   Lab Results  Component Value Date   FERRITIN 6 (L) 04/18/2022     STUDIES: No results found.  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  PET scan results from December 13, 2021 reviewed independently and report as above with no obvious metastatic disease outside of patient's known esophageal mass.  EUS completed on December 14, 2021 confirmed stage.  Patient completed cycle 6 of weekly carboplatinum and Taxol on February 06, 2022.  He subsequently completed his XRT on February 12, 2022.  Patient then  underwent surgical resection at [facility] in approximately May 2023 and then was lost to follow-up.  He has been instructed to keep his follow-up appointments at [facility] as scheduled.  Return to clinic in 6 months for routine evaluation.   Difficulty swallowing: Possibly stricture based on patient's description.  Follow-up with [facility] surgery as above for possible endoscopy. Nausea: Patient reports Protonix significantly helps therefore he was given a refill today. Wound healing: At surgical site.  Does not appear infected.  No intervention is needed patient has been instructed to ensure he mentions this to the surgical team at his appointment next week.  Patient expressed understanding and was in agreement with this plan. He also understands that He can call clinic at any time with any questions, concerns, or complaints.    Cancer Staging  Squamous cell esophageal cancer [facility]) Staging form: Esophagus - Adenocarcinoma, AJCC 8th Edition - Clinical stage from 12/22/2021: Stage III (cT3, cN0, cM0) - Signed by Jeralyn Ruths, MD on 12/22/2021 Stage prefix: Initial diagnosis Total positive nodes: 0   Jeralyn Ruths, MD   04/19/2023 11:23 AM

## 2023-07-16 ENCOUNTER — Telehealth: Payer: Self-pay | Admitting: *Deleted

## 2023-07-16 ENCOUNTER — Encounter: Payer: Self-pay | Admitting: *Deleted

## 2023-07-16 NOTE — Telephone Encounter (Signed)
I rcvd a request for a PA for pt's Protonix w/[employer]. Last prescribed by Dr. Orlie Dakin in May 2024.  Samuel Pennington ([name]) Rx #: [id number] Sent to Plan today- Drug Pantoprazole Sodium 40MG  dr tablets

## 2023-07-17 ENCOUNTER — Encounter: Payer: Self-pay | Admitting: Oncology

## 2023-07-17 NOTE — Telephone Encounter (Signed)
Samuel Pennington ([name]) - [id number] Pantoprazole Sodium 40MG  dr tablets Status: PA Response - ApprovedCreated: August 6th, 2024 [phone]: August 7th, 2024

## 2023-10-04 IMAGING — CT NM PET TUM IMG INITIAL (PI) SKULL BASE T - THIGH
1 of 9 series · 1 of 25 positions shown · non-contrast
Comparison: Chest abdomen and pelvic CTs of 11/15/2021.

CLINICAL DATA: Initial treatment strategy for staging of esophageal
adenocarcinoma.

EXAM:
NUCLEAR MEDICINE PET SKULL BASE TO THIGH
TECHNIQUE: 7.7 mCi F-18 FDG was injected intravenously. Full-ring PET imaging
was performed from the skull base to thigh after the radiotracer. CT
data was obtained and used for attenuation correction and anatomic
localization.
Fasting blood glucose: 124 mg/dl

[Series 3: ct wb 5.0 b30f · axial · 5.0mm · 0.98mm/px · 1 of 290 slices shown]
[im 290/290  brain]
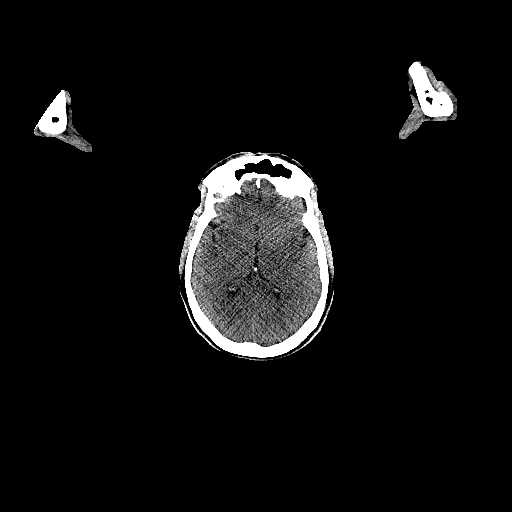

[1 of 25 positions shown; findings below may reference images not displayed]

FINDINGS: Mediastinal blood pool activity: SUV max

Liver activity: SUV max NA

NECK: No areas of abnormal hypermetabolism.

Incidental CT findings: No cervical adenopathy.

CHEST: No pulmonary parenchymal or thoracic nodal hypermetabolism.

Incidental CT findings: Deferred to recent diagnostic CT. Aortic and
coronary artery calcification.

Left hemidiaphragm elevation. Esophageal fluid level may represent a
component of obstruction secondary to the below described mass.

ABDOMEN/PELVIS: Hypermetabolism within the gastric cardia consistent
with the site of primary. Example at 5.1 x 3.4 cm and a S.U.V. max
of 13.1 on 127/3.

No abdominopelvic nodal hypermetabolism.

Incidental CT findings: Deferred to recent diagnostic CT. Abdominal
aortic atherosclerosis. Mild prostatomegaly.

SKELETON: No abnormal marrow activity.

Incidental CT findings: none
IMPRESSION: 1. Hypermetabolic GE junction mass, currently centered in the
gastric cardia.
2. No evidence of hypermetabolic nodal or distant metastasis.
3. Incidental findings, including: Coronary artery atherosclerosis.
Aortic Atherosclerosis ([id number]). Prostatomegaly.

## 2023-10-14 ENCOUNTER — Encounter: Payer: Self-pay | Admitting: Oncology

## 2023-10-21 ENCOUNTER — Inpatient Hospital Stay: Payer: Self-pay | Attending: Oncology | Admitting: Oncology

## 2023-10-21 ENCOUNTER — Inpatient Hospital Stay: Payer: Self-pay

## 2023-11-18 ENCOUNTER — Inpatient Hospital Stay: Payer: MEDICAID

## 2023-11-18 ENCOUNTER — Emergency Department: Payer: MEDICAID

## 2023-11-18 ENCOUNTER — Inpatient Hospital Stay
Admission: EM | Admit: 2023-11-18 | Discharge: 2023-11-18 | DRG: 208 | Disposition: A | Discharge status: Other Acute Inpatient Hospital | Payer: MEDICAID | Attending: Emergency Medicine | Admitting: Emergency Medicine

## 2023-11-18 ENCOUNTER — Other Ambulatory Visit: Payer: Self-pay

## 2023-11-18 DIAGNOSIS — E785 Hyperlipidemia, unspecified: Secondary | ICD-10-CM | POA: Diagnosis present

## 2023-11-18 DIAGNOSIS — Z79899 Other long term (current) drug therapy: Secondary | ICD-10-CM

## 2023-11-18 DIAGNOSIS — Z7984 Long term (current) use of oral hypoglycemic drugs: Secondary | ICD-10-CM

## 2023-11-18 DIAGNOSIS — J9601 Acute respiratory failure with hypoxia: Principal | ICD-10-CM

## 2023-11-18 DIAGNOSIS — Z1152 Encounter for screening for COVID-19: Secondary | ICD-10-CM | POA: Diagnosis not present

## 2023-11-18 DIAGNOSIS — E872 Acidosis, unspecified: Secondary | ICD-10-CM | POA: Diagnosis present

## 2023-11-18 DIAGNOSIS — A419 Sepsis, unspecified organism: Secondary | ICD-10-CM | POA: Diagnosis present

## 2023-11-18 DIAGNOSIS — Z934 Other artificial openings of gastrointestinal tract status: Secondary | ICD-10-CM | POA: Diagnosis not present

## 2023-11-18 DIAGNOSIS — J860 Pyothorax with fistula: Secondary | ICD-10-CM | POA: Diagnosis present

## 2023-11-18 DIAGNOSIS — K5900 Constipation, unspecified: Secondary | ICD-10-CM | POA: Diagnosis present

## 2023-11-18 DIAGNOSIS — R6511 Systemic inflammatory response syndrome (SIRS) of non-infectious origin with acute organ dysfunction: Principal | ICD-10-CM | POA: Diagnosis present

## 2023-11-18 DIAGNOSIS — E1165 Type 2 diabetes mellitus with hyperglycemia: Secondary | ICD-10-CM

## 2023-11-18 DIAGNOSIS — K56600 Partial intestinal obstruction, unspecified as to cause: Secondary | ICD-10-CM | POA: Diagnosis present

## 2023-11-18 DIAGNOSIS — Z9689 Presence of other specified functional implants: Secondary | ICD-10-CM | POA: Diagnosis present

## 2023-11-18 DIAGNOSIS — Z8501 Personal history of malignant neoplasm of esophagus: Secondary | ICD-10-CM

## 2023-11-18 DIAGNOSIS — Z8249 Family history of ischemic heart disease and other diseases of the circulatory system: Secondary | ICD-10-CM

## 2023-11-18 DIAGNOSIS — K56609 Unspecified intestinal obstruction, unspecified as to partial versus complete obstruction: Secondary | ICD-10-CM

## 2023-11-18 DIAGNOSIS — J986 Disorders of diaphragm: Secondary | ICD-10-CM

## 2023-11-18 DIAGNOSIS — F419 Anxiety disorder, unspecified: Secondary | ICD-10-CM | POA: Diagnosis present

## 2023-11-18 DIAGNOSIS — J690 Pneumonitis due to inhalation of food and vomit: Secondary | ICD-10-CM

## 2023-11-18 LAB — COMPREHENSIVE METABOLIC PANEL
ALT: 34 U/L (ref 0–44)
AST: 28 U/L (ref 15–41)
Albumin: 3.7 g/dL (ref 3.5–5.0)
Alkaline Phosphatase: 117 U/L (ref 38–126)
Anion gap: 12 (ref 5–15)
BUN: 31 mg/dL — ABNORMAL HIGH (ref 6–20)
CO2: 35 mmol/L — ABNORMAL HIGH (ref 22–32)
Calcium: 8.7 mg/dL — ABNORMAL LOW (ref 8.9–10.3)
Chloride: 87 mmol/L — ABNORMAL LOW (ref 98–111)
Creatinine, Ser: 0.89 mg/dL (ref 0.61–1.24)
GFR, Estimated: >60 mL/min (ref >60)
Glucose, Bld: 351 mg/dL — ABNORMAL HIGH (ref 70–99)
Potassium: 4 mmol/L (ref 3.5–5.1)
Sodium: 134 mmol/L — ABNORMAL LOW (ref 135–145)
Total Bilirubin: 0.7 mg/dL (ref <1.2)
Total Protein: 8.7 g/dL — ABNORMAL HIGH (ref 6.5–8.1)

## 2023-11-18 LAB — BLOOD GAS, VENOUS
Acid-Base Excess: 15.1 mmol/L — ABNORMAL HIGH (ref 0.0–2.0)
Bicarbonate: 41.5 mmol/L — ABNORMAL HIGH (ref 20.0–28.0)
Delivery systems: POSITIVE
FIO2: 30 %
O2 Saturation: 72.5 %
Patient temperature: 37
pCO2, Ven: 57 mm[Hg] (ref 44–60)
pH, Ven: 7.47 — ABNORMAL HIGH (ref 7.25–7.43)
pO2, Ven: 43 mm[Hg] (ref 32–45)

## 2023-11-18 LAB — CBC WITH DIFFERENTIAL/PLATELET
Abs Immature Granulocytes: 0.06 10*3/uL (ref 0.00–0.07)
Basophils Absolute: 0 10*3/uL (ref 0.0–0.1)
Basophils Relative: 0 %
Eosinophils Absolute: 0 10*3/uL (ref 0.0–0.5)
Eosinophils Relative: 0 %
HCT: 37.6 % — ABNORMAL LOW (ref 39.0–52.0)
Hemoglobin: 11.6 g/dL — ABNORMAL LOW (ref 13.0–17.0)
Immature Granulocytes: 0 %
Lymphocytes Relative: 7 %
Lymphs Abs: 0.9 10*3/uL (ref 0.7–4.0)
MCH: 26.9 pg (ref 26.0–34.0)
MCHC: 30.9 g/dL (ref 30.0–36.0)
MCV: 87 fL (ref 80.0–100.0)
Monocytes Absolute: 1.5 10*3/uL — ABNORMAL HIGH (ref 0.1–1.0)
Monocytes Relative: 11 %
Neutro Abs: 11.5 10*3/uL — ABNORMAL HIGH (ref 1.7–7.7)
Neutrophils Relative %: 82 %
Platelets: 226 10*3/uL (ref 150–400)
RBC: 4.32 MIL/uL (ref 4.22–5.81)
RDW: 18.8 % — ABNORMAL HIGH (ref 11.5–15.5)
WBC: 13.9 10*3/uL — ABNORMAL HIGH (ref 4.0–10.5)
nRBC: 0 % (ref 0.0–0.2)

## 2023-11-18 LAB — BLOOD GAS, ARTERIAL
Acid-Base Excess: 10.8 mmol/L — ABNORMAL HIGH (ref 0.0–2.0)
Acid-Base Excess: 15.5 mmol/L — ABNORMAL HIGH (ref 0.0–2.0)
Bicarbonate: 40.1 mmol/L — ABNORMAL HIGH (ref 20.0–28.0)
Bicarbonate: 40.2 mmol/L — ABNORMAL HIGH (ref 20.0–28.0)
FIO2: 100 %
FIO2: 75 %
MECHVT: 450 mL
Mechanical Rate: 24
Mechanical Rate: 24
O2 Saturation: 96 %
O2 Saturation: 98.8 %
PEEP: 8 cmH2O
PEEP: 8 cmH2O
Patient temperature: 37
Patient temperature: 37
Pressure control: 25 cmH2O
pCO2 arterial: 76 mm[Hg] (ref 32–48)
pCO2 arterial: 47 mm[Hg] (ref 32–48)
pH, Arterial: 7.33 — ABNORMAL LOW (ref 7.35–7.45)
pH, Arterial: 7.54 — ABNORMAL HIGH (ref 7.35–7.45)
pO2, Arterial: 131 mm[Hg] — ABNORMAL HIGH (ref 83–108)
pO2, Arterial: 105 mm[Hg] (ref 83–108)

## 2023-11-18 LAB — CBG MONITORING, ED
Glucose-Capillary: 214 mg/dL — ABNORMAL HIGH (ref 70–99)
Glucose-Capillary: 103 mg/dL — ABNORMAL HIGH (ref 70–99)
Glucose-Capillary: 12 mg/dL — CL (ref 70–99)
Glucose-Capillary: 16 mg/dL — CL (ref 70–99)
Glucose-Capillary: 94 mg/dL (ref 70–99)
Glucose-Capillary: 96 mg/dL (ref 70–99)
Glucose-Capillary: 93 mg/dL (ref 70–99)
Glucose-Capillary: 126 mg/dL — ABNORMAL HIGH (ref 70–99)
Glucose-Capillary: 138 mg/dL — ABNORMAL HIGH (ref 70–99)

## 2023-11-18 LAB — HEMOGLOBIN A1C
Hgb A1c MFr Bld: 7.9 % — ABNORMAL HIGH (ref 4.8–5.6)
Mean Plasma Glucose: 180.03 mg/dL

## 2023-11-18 LAB — TROPONIN I (HIGH SENSITIVITY): Troponin I (High Sensitivity): 12 ng/L (ref <18)

## 2023-11-18 LAB — PROTIME-INR
INR: 1.2 (ref 0.8–1.2)
Prothrombin Time: 15.6 s — ABNORMAL HIGH (ref 11.4–15.2)

## 2023-11-18 LAB — RESP PANEL BY RT-PCR (RSV, FLU A&B, COVID)  RVPGX2
Influenza A by PCR: NEGATIVE
Influenza B by PCR: NEGATIVE
Resp Syncytial Virus by PCR: NEGATIVE
SARS Coronavirus 2 by RT PCR: NEGATIVE

## 2023-11-18 LAB — LACTIC ACID, PLASMA
Lactic Acid, Venous: 3.7 mmol/L (ref 0.5–1.9)
Lactic Acid, Venous: 2.8 mmol/L (ref 0.5–1.9)

## 2023-11-18 LAB — BRAIN NATRIURETIC PEPTIDE: B Natriuretic Peptide: 145.3 pg/mL — ABNORMAL HIGH (ref 0.0–100.0)

## 2023-11-18 LAB — LIPASE, BLOOD: Lipase: 22 U/L (ref 11–51)

## 2023-11-18 MED ORDER — LORAZEPAM 2 MG/ML IJ SOLN
1.0000 mg | Freq: Once | INTRAMUSCULAR | Status: AC
  Administered 2023-11-18: 1 mg via INTRAVENOUS
  Filled 2023-11-18: qty 1

## 2023-11-18 MED ORDER — VANCOMYCIN HCL 750 MG/150ML IV SOLN
750.0000 mg | Freq: Two times a day (BID) | INTRAVENOUS | Status: DC
  Administered 2023-11-18: 750 mg via INTRAVENOUS
  Filled 2023-11-18: qty 150

## 2023-11-18 MED ORDER — ENOXAPARIN SODIUM 40 MG/0.4ML IJ SOSY
40.0000 mg | PREFILLED_SYRINGE | INTRAMUSCULAR | Status: DC
  Filled 2023-11-18: qty 0.4

## 2023-11-18 MED ORDER — OXYCODONE HCL 5 MG PO TABS
10.0000 mg | ORAL_TABLET | Freq: Once | ORAL | Status: AC
  Administered 2023-11-18: 10 mg via ORAL
  Filled 2023-11-18: qty 2

## 2023-11-18 MED ORDER — ENOXAPARIN SODIUM 60 MG/0.6ML IJ SOSY
1.0000 mg/kg | PREFILLED_SYRINGE | Freq: Once | INTRAMUSCULAR | Status: AC
  Administered 2023-11-18: 60 mg via SUBCUTANEOUS
  Filled 2023-11-18: qty 0.6

## 2023-11-18 MED ORDER — NOREPINEPHRINE 4 MG/250ML-% IV SOLN
INTRAVENOUS | Status: AC
  Administered 2023-11-18: 2 ug/min via INTRAVENOUS
  Filled 2023-11-18: qty 250

## 2023-11-18 MED ORDER — DEXTROSE 50 % IV SOLN
1.0000 | Freq: Once | INTRAVENOUS | Status: AC
  Administered 2023-11-18: 50 mL via INTRAVENOUS
  Filled 2023-11-18: qty 50

## 2023-11-18 MED ORDER — VANCOMYCIN HCL IN DEXTROSE 1-5 GM/200ML-% IV SOLN
1000.0000 mg | Freq: Once | INTRAVENOUS | Status: AC
  Administered 2023-11-18: 1000 mg via INTRAVENOUS
  Filled 2023-11-18: qty 200

## 2023-11-18 MED ORDER — ONDANSETRON HCL 4 MG/2ML IJ SOLN
4.0000 mg | INTRAMUSCULAR | Status: AC
  Administered 2023-11-18: 4 mg via INTRAVENOUS
  Filled 2023-11-18: qty 2

## 2023-11-18 MED ORDER — SODIUM CHLORIDE 0.9 % IV BOLUS (SEPSIS)
1000.0000 mL | Freq: Once | INTRAVENOUS | Status: AC
  Administered 2023-11-18: 1000 mL via INTRAVENOUS

## 2023-11-18 MED ORDER — INSULIN GLARGINE-YFGN 100 UNIT/ML ~~LOC~~ SOLN
15.0000 [IU] | Freq: Every day | SUBCUTANEOUS | Status: DC
  Administered 2023-11-18: 15 [IU] via SUBCUTANEOUS
  Filled 2023-11-18: qty 0.15

## 2023-11-18 MED ORDER — PIPERACILLIN-TAZOBACTAM 3.375 G IVPB 30 MIN
3.3750 g | Freq: Once | INTRAVENOUS | Status: DC
Start: 2023-11-18 — End: 2023-11-18

## 2023-11-18 MED ORDER — LACTATED RINGERS IV SOLN
150.0000 mL/h | INTRAVENOUS | Status: DC
  Administered 2023-11-18: 150 mL/h via INTRAVENOUS

## 2023-11-18 MED ORDER — ROCURONIUM BROMIDE 10 MG/ML (PF) SYRINGE
PREFILLED_SYRINGE | INTRAVENOUS | Status: AC
  Administered 2023-11-18: 100 mg
  Filled 2023-11-18: qty 10

## 2023-11-18 MED ORDER — FENTANYL 2500MCG IN NS 250ML (10MCG/ML) PREMIX INFUSION: 0.0000 ug/h | INTRAVENOUS | Status: DC

## 2023-11-18 MED ORDER — ONDANSETRON HCL 4 MG PO TABS: 4.0000 mg | ORAL_TABLET | Freq: Four times a day (QID) | ORAL | Status: DC | PRN

## 2023-11-18 MED ORDER — ALBUTEROL SULFATE (2.5 MG/3ML) 0.083% IN NEBU: 2.5000 mg | INHALATION_SOLUTION | RESPIRATORY_TRACT | Status: DC | PRN

## 2023-11-18 MED ORDER — SODIUM CHLORIDE 0.9 % IV SOLN
2.0000 g | Freq: Three times a day (TID) | INTRAVENOUS | Status: DC
  Administered 2023-11-18 (×2): 2 g via INTRAVENOUS
  Filled 2023-11-18 (×2): qty 12.5

## 2023-11-18 MED ORDER — DEXTROSE 10 % IV SOLN: INTRAVENOUS | Status: DC

## 2023-11-18 MED ORDER — VANCOMYCIN HCL IN DEXTROSE 1-5 GM/200ML-% IV SOLN: 1000.0000 mg | Freq: Once | INTRAVENOUS | Status: DC

## 2023-11-18 MED ORDER — PROPOFOL 1000 MG/100ML IV EMUL
5.0000 ug/kg/min | INTRAVENOUS | Status: DC
  Administered 2023-11-18: 35 ug/kg/min via INTRAVENOUS
  Administered 2023-11-18: 40 ug/kg/min via INTRAVENOUS
  Filled 2023-11-18 (×2): qty 100

## 2023-11-18 MED ORDER — ETOMIDATE 2 MG/ML IV SOLN
INTRAVENOUS | Status: AC
  Administered 2023-11-18: 20 mg
  Filled 2023-11-18: qty 10

## 2023-11-18 MED ORDER — MORPHINE SULFATE (PF) 4 MG/ML IV SOLN
4.0000 mg | Freq: Once | INTRAVENOUS | Status: AC
  Administered 2023-11-18: 4 mg via INTRAVENOUS
  Filled 2023-11-18: qty 1

## 2023-11-18 MED ORDER — NOREPINEPHRINE 4 MG/250ML-% IV SOLN: 0.0000 ug/min | INTRAVENOUS | Status: DC

## 2023-11-18 MED ORDER — FENTANYL 2500MCG IN NS 250ML (10MCG/ML) PREMIX INFUSION
INTRAVENOUS | Status: AC
  Administered 2023-11-18: 25 ug/h via INTRAVENOUS
  Filled 2023-11-18: qty 250

## 2023-11-18 MED ORDER — VANCOMYCIN HCL 500 MG/100ML IV SOLN
500.0000 mg | Freq: Once | INTRAVENOUS | Status: AC
  Administered 2023-11-18: 500 mg via INTRAVENOUS
  Filled 2023-11-18: qty 100

## 2023-11-18 MED ORDER — VANCOMYCIN HCL 750 MG/150ML IV SOLN: 750.0000 mg | Freq: Two times a day (BID) | INTRAVENOUS | Status: DC

## 2023-11-18 MED ORDER — INSULIN ASPART 100 UNIT/ML IJ SOLN
0.0000 [IU] | INTRAMUSCULAR | Status: DC
  Administered 2023-11-18: 2 [IU] via SUBCUTANEOUS
  Administered 2023-11-18: 5 [IU] via SUBCUTANEOUS
  Filled 2023-11-18 (×2): qty 1

## 2023-11-18 MED ORDER — ACETAMINOPHEN 325 MG PO TABS: 650.0000 mg | ORAL_TABLET | Freq: Four times a day (QID) | ORAL | Status: DC | PRN

## 2023-11-18 MED ORDER — ONDANSETRON HCL 4 MG/2ML IJ SOLN
4.0000 mg | Freq: Four times a day (QID) | INTRAMUSCULAR | Status: DC | PRN
  Administered 2023-11-18: 4 mg via INTRAVENOUS
  Filled 2023-11-18: qty 2

## 2023-11-18 MED ORDER — ACETAMINOPHEN 325 MG RE SUPP: 650.0000 mg | Freq: Four times a day (QID) | RECTAL | Status: DC | PRN

## 2023-11-18 MED ORDER — PROPOFOL 1000 MG/100ML IV EMUL
INTRAVENOUS | Status: AC
  Administered 2023-11-18: 5 ug/kg/min via INTRAVENOUS
  Filled 2023-11-18: qty 100

## 2023-11-18 MED ORDER — IOHEXOL 350 MG/ML SOLN
100.0000 mL | Freq: Once | INTRAVENOUS | Status: AC | PRN
  Administered 2023-11-18: 100 mL via INTRAVENOUS

## 2023-11-18 NOTE — ED Notes (Signed)
[facility] does not have propofol on the truck. A sealed bottle sent with crew to ensure medication does not run out in route. They will return the bottle to [place] if they do not use it.

## 2023-11-18 NOTE — ED Notes (Signed)
called to [facility] per MD Forbach/Transfer/Facesheet Faxed/Rep:Nicole

## 2023-11-18 NOTE — H&P (Signed)
History and Physical    Patient: Samuel Pennington HYQ:[id number] DOB: 06-27-1966 DOA: 11/18/2023 DOS: the patient was seen and examined on 11/18/2023 PCP: Preston Fleeting, MD  Patient coming from: Home  Chief Complaint:  Chief Complaint  Patient presents with   Shortness of Breath    HPI: Samuel Pennington is a 57 y.o. 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   O2 sat 76% on room air with EMS.  He was placed on CPAP and administered DuoNeb en route.  Patient reports no prior history of shortness of breath.  Denied fever or chills, denied chest pain and denied lower extremity swelling or pain.  History limited due to him being on BiPAP following arrival. Past history also significant for diabetes and anxiety he also has a jejunostomy tube ED course and data review: On arrival tachypneic to 42 and tachycardic to the 120s, BP 123/95  Labs notable for  VBG on BiPAP 30% with pH 7.47 pCO2 57 WBC 14,000 with lactic acid 3.7.  Respiratory viral panel negative Troponin 12 and BNP 145 Lipase and LFTs WNL Glucose 351 with normal anion gap  Chest x-ray showing IMPRESSION: Elevation of left hemidiaphragm with volume loss on the left.  Postsurgical changes with esophageal stent in place.  Right basilar atelectasis  Patient started empirically on Lovenox for possible PE pending further evaluation with CTA chest     Past Medical History:  Diagnosis Date   Cancer (HCC) 2022   Diabetes mellitus without complication (HCC)    Gastric ulcer    Hyperlipidemia    Neuromuscular disorder (HCC)    Past Surgical History:  Procedure Laterality Date   COLONOSCOPY WITH PROPOFOL N/A 11/14/2021   Procedure: COLONOSCOPY WITH  PROPOFOL;  Surgeon: Regis Bill, MD;  Location: [facility] ENDOSCOPY;  Service: Endoscopy;  Laterality: N/A;   ESOPHAGOGASTRODUODENOSCOPY (EGD) WITH PROPOFOL N/A 11/14/2021   Procedure: ESOPHAGOGASTRODUODENOSCOPY (EGD) WITH PROPOFOL;  Surgeon: Regis Bill, MD;  Location: [facility] ENDOSCOPY;  Service: Endoscopy;  Laterality: N/A;   ESOPHAGOGASTRODUODENOSCOPY (EGD) WITH PROPOFOL N/A 04/19/2022   Procedure: ESOPHAGOGASTRODUODENOSCOPY (EGD) WITH PROPOFOL;  Surgeon: Wyline Mood, MD;  Location: [facility] ENDOSCOPY;  Service: Gastroenterology;  Laterality: N/A;   ESOPHAGOGASTRODUODENOSCOPY (EGD) WITH PROPOFOL N/A 05/01/2022   Procedure: ESOPHAGOGASTRODUODENOSCOPY (EGD) WITH PROPOFOL;  Surgeon: Toney Reil, MD;  Location: [facility] ENDOSCOPY;  Service: Gastroenterology;  Laterality: N/A;   EUS N/A 12/14/2021   Procedure: FULL UPPER ENDOSCOPIC ULTRASOUND (EUS) RADIAL;  Surgeon: Bearl Mulberry, MD;  Location: [facility] ENDOSCOPY;  Service: Gastroenterology;  Laterality: N/A;  SPANISH INTERPRETER   Social History:  reports that he has never smoked. He has never used smokeless tobacco. He reports current alcohol use. He reports that he does not use drugs.  No Known Allergies  Family History  Problem Relation Age of Onset   Hypertension Mother     Prior to Admission medications   Medication Sig Start Date End Date Taking? Authorizing Provider  acetaminophen (TYLENOL) 325 MG tablet Take 2 tablets (650 mg total) by mouth every 6 (six) hours as needed for mild pain or fever. Patient not taking: Reported on 04/19/2023 05/02/22   Leeroy Bock, MD  ALPRAZolam Prudy Feeler) 0.25 MG tablet Take 1 tablet (0.25 mg total) by mouth at bedtime as needed for anxiety. 03/22/22  Jeralyn Ruths, MD  amoxicillin-clavulanate (AUGMENTIN) 875-125 MG tablet Take 1 tablet by mouth 2 (two) times daily. 10/04/22   Arnaldo Natal, MD  atorvastatin (LIPITOR) 40 MG tablet TOMA UNA TABLETA POR BOCA AL ACOSTARSE PARA COLESTEROL  ALTO Patient not taking: Reported on 04/19/2023 07/06/20 04/30/22  Revelo, Presley Raddle, MD  escitalopram (LEXAPRO) 5 MG tablet Take by mouth. 02/20/23 05/21/23  [provider]  ferrous sulfate 325 (65 FE) MG tablet Take 1 tablet (325 mg total) by mouth daily with breakfast. Patient not taking: Reported on 04/19/2023 04/21/22 05/21/22  Stephania Fragmin C, MD  glipiZIDE (GLUCOTROL XL) 5 MG 24 hr tablet TOME 1 TABLETA POR BOCA DIARIO PARA LA DIABETES Patient not taking: Reported on 04/19/2023 12/13/20 04/30/22  Revelo, Presley Raddle, MD  metFORMIN (GLUCOPHAGE) 1000 MG tablet Take 1,000 mg by mouth daily. 08/27/18   Revelo, Presley Raddle, MD  OLANZapine (ZYPREXA) 5 MG tablet Take 1 tablet (5 mg total) by mouth at bedtime as needed (nausea). 04/10/22   Borders, Daryl Eastern, NP  oxyCODONE (OXY IR/ROXICODONE) 5 MG immediate release tablet Take 10 mg by mouth every 6 (six) hours as needed for pain. 11/06/21   [provider]  pantoprazole (PROTONIX) 40 MG tablet Take 1 tablet (40 mg total) by mouth 2 (two) times daily before a meal. 04/19/23 10/16/23  Finnegan, Tollie Pizza, MD  polyethylene glycol (MIRALAX / GLYCOLAX) 17 g packet Take 17 g by mouth daily as needed for severe constipation. 04/21/22   Amin, Ankit C, MD  sucralfate (CARAFATE) 1 g tablet Take 1 tablet (1 g total) by mouth 4 (four) times daily -  with meals and at bedtime. Patient not taking: Reported on 04/18/2022 02/26/22   Karen Kitchens    Physical Exam: Vitals:   11/18/23 0300 11/18/23 0330 11/18/23 0400 11/18/23 0430  BP: (!) 126/94 (!) 123/95 (!) 135/103 (!) 134/99  Pulse: (!) 139 (!) 129 (!) 126 (!) 127  Resp: (!) 39 (!) 42 (!) 36 (!) 35  Temp:      TempSrc:      SpO2: 99% 98% 99% 100%  Weight:      Height:       Physical Exam Vitals and nursing note reviewed.  Constitutional:      General: He is in acute distress.     Comments: Patient conversant to BiPAP but is short sentences  HENT:     Head: Normocephalic and  atraumatic.  Cardiovascular:     Rate and Rhythm: Regular rhythm. Tachycardia present.     Heart sounds: Normal heart sounds.  Pulmonary:     Effort: Tachypnea present.     Breath sounds: Wheezing present.  Abdominal:     Palpations: Abdomen is soft.     Tenderness: There is no abdominal tenderness.     Comments: Jejunostomy tube  Neurological:     Mental Status: Mental status is at baseline.     Labs on Admission: I have personally reviewed following labs and imaging studies  CBC: Recent Labs  Lab 11/18/23 0255  WBC 13.9*  NEUTROABS 11.5*  HGB 11.6*  HCT 37.6*  MCV 87.0  PLT 226   Basic Metabolic Panel: Recent Labs  Lab 11/18/23 0255  NA 134*  K 4.0  CL 87*  CO2 35*  GLUCOSE 351*  BUN 31*  CREATININE 0.89  CALCIUM 8.7*   GFR: Estimated Creatinine Clearance: 77.1 mL/min (by C-G formula based on SCr of 0.89 mg/dL). Liver Function Tests: Recent Labs  Lab 11/18/23 0255  AST 28  ALT 34  ALKPHOS 117  BILITOT 0.7  PROT 8.7*  ALBUMIN 3.7   Recent Labs  Lab 11/18/23 0255  LIPASE 22   No results for input(s): "AMMONIA" in the last 168 hours. Coagulation Profile: Recent Labs  Lab 11/18/23 0255  INR 1.2   Cardiac Enzymes: No results for input(s): "CKTOTAL", "CKMB", "CKMBINDEX", "TROPONINI" in the last 168 hours. BNP (last 3 results) No results for input(s): "PROBNP" in the last 8760 hours. HbA1C: No results for input(s): "HGBA1C" in the last 72 hours. CBG: No results for input(s): "GLUCAP" in the last 168 hours. Lipid Profile: No results for input(s): "CHOL", "HDL", "LDLCALC", "TRIG", "CHOLHDL", "LDLDIRECT" in the last 72 hours. Thyroid Function Tests: No results for input(s): "TSH", "T4TOTAL", "FREET4", "T3FREE", "THYROIDAB" in the last 72 hours. Anemia Panel: No results for input(s): "VITAMINB12", "FOLATE", "FERRITIN", "TIBC", "IRON", "RETICCTPCT" in the last 72 hours. Urine analysis:    Component Value Date/Time   COLORURINE STRAW (A)  04/18/2022 0001   APPEARANCEUR CLEAR (A) 04/18/2022 0001   LABSPEC 1.009 04/18/2022 0001   PHURINE 6.0 04/18/2022 0001   GLUCOSEU NEGATIVE 04/18/2022 0001   HGBUR NEGATIVE 04/18/2022 0001   BILIRUBINUR NEGATIVE 04/18/2022 0001   KETONESUR NEGATIVE 04/18/2022 0001   PROTEINUR NEGATIVE 04/18/2022 0001   NITRITE NEGATIVE 04/18/2022 0001   LEUKOCYTESUR NEGATIVE 04/18/2022 0001    Radiological Exams on Admission: DG Chest Port 1 View  Result Date: 11/18/2023 CLINICAL DATA:  Possible sepsis, history of esophageal carcinoma EXAM: PORTABLE CHEST 1 VIEW COMPARISON:  10/04/2022 FINDINGS: Cardiac shadow is stable. Esophageal stent is again seen and stable. Previously seen left mainstem bronchial stent has been removed. Considerable volume loss on the left is noted with elevation of left hemidiaphragm. Mild right basilar atelectasis is seen. Postsurgical changes in the right chest wall are noted. IMPRESSION: Elevation of left hemidiaphragm with volume loss on the left. Postsurgical changes with esophageal stent in place. Right basilar atelectasis. Electronically Signed   By: Alcide Clever M.D.   On: 11/18/2023 03:38     Data Reviewed: Relevant notes from primary care and specialist visits, past discharge summaries as available in EHR, including Care Everywhere. Prior diagnostic testing as pertinent to current admission diagnoses Updated medications and problem lists for reconciliation ED course, including vitals, labs, imaging, treatment and response to treatment Triage notes, nursing and pharmacy notes and ED provider's notes Notable results as noted in HPI   Assessment and Plan: * Acute respiratory failure with hypoxia (HCC) SIRS, possible sepsis Possible aspiration, possible PE S/p esophageal stent for bronchial fistula 11/15/2023 at [facility] History of esophageal cancer s/p esophagectomy SIRS criteria include tachycardia and tachypnea, hypoxia, leukocytosis and lactic acidosis Chest x-ray  showing stable placement of esophageal stent Follow-up CT chest abdomen pelvis Patient received therapeutic Lovenox dose in the ED pending CTA chest Empiric cefepime and vancomycin fluids Continue BiPAP and wean as tolerated Consider pulmonology consult  Uncontrolled type 2 diabetes mellitus with hyperglycemia, without long-term current use of insulin (HCC) Sliding scale insulin coverage Basal insulin  Jejunostomy tube present (HCC) Hold tube feeds due to suspicion for aspiration Ostomy care      DVT prophylaxis: Lovenox  Consults: none  Advance Care Planning:   Code Status: Prior   Family Communication: none  Disposition Plan: Back to previous home environment  Severity of Illness: The appropriate patient status for this patient is INPATIENT. Inpatient status is judged to be reasonable and necessary in order to provide the  required intensity of service to ensure the patient's safety. The patient's presenting symptoms, physical exam findings, and initial radiographic and laboratory data in the context of their chronic comorbidities is felt to place them at high risk for further clinical deterioration. Furthermore, it is not anticipated that the patient will be medically stable for discharge from the hospital within 2 midnights of admission.   * I certify that at the point of admission it is my clinical judgment that the patient will require inpatient hospital care spanning beyond 2 midnights from the point of admission due to high intensity of service, high risk for further deterioration and high frequency of surveillance required.*  Author: Andris Baumann, MD 11/18/2023 5:35 AM  For on call review [email or web address].

## 2023-11-18 NOTE — Progress Notes (Signed)
Pharmacy Antibiotic Note  Samuel Pennington is a 57 y.o. male admitted on 11/18/2023 with pneumonia.  Pharmacy has been consulted for vancomycin, cefepime dosing.  Plan: Cefepime 2 gm IV Q8H ordered to start on 12/9 @ 0600.  Vancomycin 1500 mg IV X 1 ordered for 12/9 @ ~ 0700. Vancomycin 750 mg IV Q12H ordered to start on 12/9 @ 1900.  AUC = 495.2 Vanc trough = 14.2   Height: 5\' 5"  (165.1 cm) Weight: 61 kg (134 lb 7.7 oz) IBW/kg (Calculated) : 61.5  Temp (24hrs), Avg:98.7 F (37.1 C), Min:98.7 F (37.1 C), Max:98.7 F (37.1 C)  Recent Labs  Lab 11/18/23 0255  WBC 13.9*  CREATININE 0.89  LATICACIDVEN 3.7*    Estimated Creatinine Clearance: 77.1 mL/min (by C-G formula based on SCr of 0.89 mg/dL).    No Known Allergies  Antimicrobials this admission:   >>    >>   Dose adjustments this admission:   Microbiology results:  BCx:   UCx:    Sputum:    MRSA PCR:   Thank you for allowing pharmacy to be a part of this patient's care.  Wissam Resor D 11/18/2023 5:54 AM

## 2023-11-18 NOTE — ED Notes (Signed)
Patient assisted to stand by this RN to use urinal. Patient tolerated well. Patient sat back down after using urinal and started having increased RR, labored breathing, and got extremely weak. Patient also began leaning head back and closing eyes. This RN is unaware if patient had a "syncopal episode". Patient was not responding to this RN but maintained eye contact with this RN. This RN kept talking to patient reassuring him that everything is okay. This RN pushed emergency staff assist button. Several RNs and MD Modesto Charon to bedside. MD Modesto Charon ordered for patient to be put back on BIPAP at this time.

## 2023-11-18 NOTE — ED Provider Notes (Addendum)
Pending transfer to [facility]. Was on 4 L nasal cannula down from BiPAP previously. Developed a transient episode of worsening shortness of breath, gasping for air, coarse breath sounds noted bilaterally, placed back on BiPAP with much better subjective shortness of breath and notable improvement in work of breathing.  Was nauseated and given a dose of IV Zofran, but I think BiPAP still appropriate given his increased work of breathing at this time despite his vomiting risk has not vomited yet.  With CT findings of partial bowel obstruction I considered NG tube placement but with distorted anatomy and recent procedure I was hesitant and I consulted with general surgeon Dr. Maurine Minister who agreed to avoid NG tube placement at this time instead put his jejunostomy tube open to gravity bag.  His nausea was greatly improved after IV Zofran.  Still pending transfer to [facility], troubleshooting [employer] imaging with surgeons at [facility].  -- Spoke with [facility] transfer center, accepted by Dr Rhona Raider to CT surgery service to stepdown unit.   --  Patient with increased work of breathing, less responsiveness, very low tidal volumes 100 200 on BiPAP maximum settings, I do feel he is tiring out so I intubated him.  I notified [facility].  They have upgraded to ICU.  I spoke with his brother Thyra Breed updated him on care plan.  Awaiting bed.   Critical Care performed: Yes, see critical care procedure note(s)  .Critical Care  Performed by: Pilar Jarvis, MD Authorized by: Pilar Jarvis, MD   Critical care provider statement:    Critical care time (minutes):  60   Critical care was time spent personally by me on the following activities:  Development of treatment plan with patient or surrogate, discussions with consultants, evaluation of patient's response to treatment, examination of patient, ordering and review of laboratory studies, ordering and review of radiographic studies, ordering and performing treatments and interventions,  pulse oximetry, re-evaluation of patient's condition and review of old charts Procedure Name: Intubation Date/Time: 11/18/2023 1:47 PM  Performed by: Pilar Jarvis, MDPre-anesthesia Checklist: Patient identified, Patient being monitored, Timeout performed and Emergency Drugs available Preoxygenation: Pre-oxygenation with 100% oxygen Induction Type: Rapid sequence Laryngoscope Size: Glidescope and 3 Grade View: Grade I Tube size: 7.0 mm Number of attempts: 1 Placement Confirmation: ETT inserted through vocal cords under direct vision, Breath sounds checked- equal and bilateral, CO2 detector and Positive ETCO2 Secured at: 25 cm Tube secured with: ETT holder        Pilar Jarvis, MD 11/18/23 1347

## 2023-11-18 NOTE — Assessment & Plan Note (Addendum)
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 S/p bronchial stent for  main stem bronchial fistula, removed 10/25/23 S/p esophageal stent placement 11/15/2023 at [facility] History of esophageal cancer s/p esophagectomy  Recommend first consulting with [facility] for opinion on possible procediural complication  SIRS criteria include tachycardia and tachypnea, hypoxia, leukocytosis and lactic acidosis Chest x-ray showing stable placement of esophageal stent Follow-up CT chest abdomen pelvis Patient received therapeutic Lovenox dose in the ED pending CTA chest Empiric cefepime and vancomycin fluids Continue BiPAP and wean as tolerated Consider pulmonology consult

## 2023-11-18 NOTE — ED Triage Notes (Addendum)
Patient being treated for esophageal cancer at [facility], ems called out for possible constipation,  patient short of breath with spo2 76% given duoneb and placed on cpap.

## 2023-11-18 NOTE — Consult Note (Signed)
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, removed 10/25/23 S/p esophageal stent placement 11/15/2023 at [facility] History of esophageal cancer s/p esophagectomy  Recommend first consulting with [facility] for opinion on possible procediural complication  SIRS criteria include tachycardia and tachypnea, hypoxia, leukocytosis and lactic acidosis Chest x-ray showing stable placement of esophageal stent Follow-up CT chest abdomen pelvis Patient received therapeutic Lovenox dose in the ED pending CTA chest Empiric cefepime and vancomycin fluids Continue BiPAP and wean as tolerated Consider pulmonology consult   Uncontrolled type 2 diabetes mellitus with hyperglycemia, without long-term current use of insulin (HCC) Sliding scale insulin coverage Basal insulin  Jejunostomy tube present (HCC) Hold tube feeds due to suspicion for aspiration Ostomy care       TRH will continue to follow the patient.  HPI: Samuel Pennington is a 57 y.o. 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   O2 sat 76% on room air with EMS.  He was placed on CPAP and administered DuoNeb en route.  Patient  reports no prior history of shortness of breath.  Denied fever or chills, denied chest pain and denied lower extremity swelling or pain.  History limited due to him being on BiPAP following arrival. Past history also significant for diabetes and anxiety he also has a jejunostomy tube ED course and data review: On arrival tachypneic to 42 and tachycardic to the 120s, BP 123/95  Labs notable for  VBG on BiPAP 30% with pH 7.47 pCO2 57 WBC 14,000 with lactic acid 3.7.  Respiratory viral panel negative Troponin 12 and BNP 145 Lipase and LFTs WNL Glucose 351 with normal anion gap  Chest x-ray showing IMPRESSION: Elevation of left hemidiaphragm with volume loss on the left.  Postsurgical changes with esophageal stent in place.  Right basilar atelectasis  Patient started empirically on Lovenox for possible PE pending further evaluation with CTA chest .   Past Medical History:  Diagnosis Date   Cancer (HCC) 2022   Diabetes mellitus without complication (HCC)    Gastric ulcer    Hyperlipidemia    Neuromuscular disorder (HCC)    Past Surgical History:  Procedure Laterality Date   COLONOSCOPY WITH PROPOFOL N/A 11/14/2021   Procedure: COLONOSCOPY WITH PROPOFOL;  Surgeon: Regis Bill, MD;  Location: [facility] ENDOSCOPY;  Service: Endoscopy;  Laterality: N/A;   ESOPHAGOGASTRODUODENOSCOPY (EGD) WITH PROPOFOL N/A 11/14/2021   Procedure: ESOPHAGOGASTRODUODENOSCOPY (EGD) WITH PROPOFOL;  Surgeon: Regis Bill, MD;  Location: [facility] ENDOSCOPY;  Service: Endoscopy;  Laterality: N/A;   ESOPHAGOGASTRODUODENOSCOPY (EGD) WITH PROPOFOL N/A 04/19/2022   Procedure: ESOPHAGOGASTRODUODENOSCOPY (EGD) WITH PROPOFOL;  Surgeon: Wyline Mood, MD;  Location: [facility] ENDOSCOPY;  Service: Gastroenterology;  Laterality: N/A;   ESOPHAGOGASTRODUODENOSCOPY (EGD) WITH PROPOFOL N/A 05/01/2022   Procedure: ESOPHAGOGASTRODUODENOSCOPY (EGD) WITH PROPOFOL;  Surgeon: Toney Reil, MD;  Location: [facility] ENDOSCOPY;  Service:  Gastroenterology;  Laterality: N/A;   EUS N/A 12/14/2021   Procedure: FULL UPPER ENDOSCOPIC ULTRASOUND (EUS) RADIAL;  Surgeon: Bearl Mulberry, MD;  Location: [facility] ENDOSCOPY;  Service: Gastroenterology;  Laterality: N/A;  SPANISH INTERPRETER   Social History:  reports that he has never smoked. He has never used smokeless tobacco. He reports current alcohol use. He reports that he does not use drugs.  No Known Allergies  Family History  Problem Relation Age of Onset   Hypertension Mother     Prior to Admission medications   Medication Sig Start Date End Date Taking? Authorizing Provider  acetaminophen (TYLENOL) 325 MG tablet Take 2 tablets (650 mg total) by mouth every 6 (six) hours as needed for mild pain or fever. Patient not taking: Reported on 04/19/2023 05/02/22   Leeroy Bock, MD  ALPRAZolam Prudy Feeler) 0.25 MG tablet Take 1 tablet (0.25 mg total) by mouth at bedtime as needed for anxiety. 03/22/22   Jeralyn Ruths, MD  amoxicillin-clavulanate (AUGMENTIN) 875-125 MG tablet Take 1 tablet by mouth 2 (two) times daily. 10/04/22   Arnaldo Natal, MD  atorvastatin (LIPITOR) 40 MG tablet TOMA UNA TABLETA POR BOCA AL ACOSTARSE PARA COLESTEROL ALTO Patient not taking: Reported on 04/19/2023 07/06/20 04/30/22  Revelo, Presley Raddle, MD  escitalopram (LEXAPRO) 5 MG tablet Take by mouth. 02/20/23 05/21/23  [provider]  ferrous sulfate 325 (65 FE) MG tablet Take 1 tablet (325 mg total) by mouth daily with breakfast. Patient not taking: Reported on 04/19/2023 04/21/22 05/21/22  Stephania Fragmin C, MD  glipiZIDE (GLUCOTROL XL) 5 MG 24 hr tablet TOME 1 TABLETA POR BOCA DIARIO PARA LA DIABETES Patient not taking: Reported on 04/19/2023 12/13/20 04/30/22  Revelo, Presley Raddle, MD  metFORMIN (GLUCOPHAGE) 1000 MG tablet Take 1,000 mg by mouth daily. 08/27/18   Revelo, Presley Raddle, MD  OLANZapine (ZYPREXA) 5 MG tablet Take 1 tablet (5 mg total) by mouth at bedtime as needed (nausea). 04/10/22    Borders, Daryl Eastern, NP  oxyCODONE (OXY IR/ROXICODONE) 5 MG immediate release tablet Take 10 mg by mouth every 6 (six) hours as needed for pain. 11/06/21   [provider]  pantoprazole (PROTONIX) 40 MG tablet Take 1 tablet (40 mg total) by mouth 2 (two) times daily before a meal. 04/19/23 10/16/23  Finnegan, Tollie Pizza, MD  polyethylene glycol (MIRALAX / GLYCOLAX) 17 g packet Take 17 g by mouth daily as needed for severe constipation. 04/21/22   Amin, Ankit C, MD  sucralfate (CARAFATE) 1 g tablet Take 1 tablet (1 g total) by mouth 4 (four) times daily -  with meals and at bedtime. Patient not taking: Reported on 04/18/2022 02/26/22   Karen Kitchens    Physical Exam: Vitals:   11/18/23 0300 11/18/23 0330 11/18/23 0400 11/18/23 0430  BP: (!) 126/94 (!) 123/95 (!) 135/103 (!) 134/99  Pulse: (!) 139 (!) 129 (!) 126 (!) 127  Resp: (!) 39 (!) 42 (!) 36 (!) 35  Temp:      TempSrc:      SpO2: 99% 98% 99% 100%  Weight:      Height:       Physical Exam Vitals and nursing note reviewed.  Constitutional:      General: He is in acute distress.     Comments: Able to speak in short sentences through Bipap mask  HENT:     Head: Normocephalic and atraumatic.  Cardiovascular:     Rate and Rhythm: Regular rhythm. Tachycardia present.  Heart sounds: Normal heart sounds.  Pulmonary:     Effort: Tachypnea and accessory muscle usage present.     Breath sounds: Wheezing present.  Abdominal:     Palpations: Abdomen is soft.     Tenderness: There is no abdominal tenderness.  Neurological:     Mental Status: Mental status is at baseline.     Data Reviewed: Relevant notes from primary care and specialist visits, past discharge summaries as available in EHR, including Care Everywhere. Prior diagnostic testing as pertinent to current admission diagnoses Updated medications and problem lists for reconciliation ED course, including vitals, labs, imaging, treatment and response to  treatment Triage notes, nursing and pharmacy notes and ED provider's notes Notable results as noted in HPI   Family Communication: none Primary team communication: Dr York Cerise Thank you very much for involving [place] in the care of your patient.  Author: Andris Baumann, MD 11/18/2023 6:29 AM  For on call review [email or web address].

## 2023-11-18 NOTE — ED Notes (Signed)
The Accepting Dr. for this pt is Dr. Karie Georges, Bed 7 west 10 . Called [facility] for and update on the EMS spoke with rep. Kim she stated and EMS should be here with in the hour. THE NUMBER TO CALL REPORT IS [phone]

## 2023-11-18 NOTE — ED Provider Notes (Signed)
[facility] Provider Note    Event Date/Time   First MD Initiated Contact with Patient 11/18/23 0241     (approximate)   History   Shortness of Breath  The patient and/or family speak(s) Spanish.  They understand they have the right to the use of a hospital interpreter, however at this time they prefer to speak directly with me in Spanish.  They know that they can ask for an interpreter at any time.  Level 5 caveat:  history/ROS limited by acute/critical illness  HPI Samuel Pennington is a 57 y.o. male who presents by EMS for acute shortness of breath.  He has a history of esophageal cancer and receives care primarily at [facility].  He has been dealing with some constipation and abdominal pain recently which is the reason for the initial call to EMS.  However when they got there the family explained that he was short of breath.  Firefighters report that his initial SpO2 was in the 70s and he was severely short of breath and working hard to breathe.  EMS gave him a DuoNeb without any change in status but placed him on CPAP given the sound of fluid on his lungs and that improved his status by the time he arrived to the ED.  The patient reports that he became acutely short of breath and that he does not know if he has a history of heart failure.  He developed chest pain but after the shortness of breath started.  He feels better after being on the CPAP by EMS.  He denies any knowledge of having blood clots in his lungs.     Physical Exam   Triage Vital Signs: ED Triage Vitals  Encounter Vitals Group     BP 11/18/23 0245 (!) 126/92     Systolic BP Percentile --      Diastolic BP Percentile --      Pulse Rate 11/18/23 0245 (!) 137     Resp 11/18/23 0245 (!) 38     Temp 11/18/23 0245 98.7 F (37.1 C)     Temp Source 11/18/23 0245 Oral     SpO2 11/18/23 0241 (!) 76 %     Weight 11/18/23 0246 61 kg (134 lb 7.7 oz)     Height 11/18/23 0246 1.651 m (5\' 5" )     Head  Circumference --      Peak Flow --      Pain Score 11/18/23 0246 8     Pain Loc --      Pain Education --      Exclude from Growth Chart --     Most recent vital signs: Vitals:   11/18/23 0430 11/18/23 0719  BP: (!) 134/99   Pulse: (!) 127   Resp: (!) 35   Temp:  98.5 F (36.9 C)  SpO2: 100%     General: Awake, alert, severe respiratory distress CV:  Good peripheral perfusion.  Tachycardia, regular rhythm Resp:  Increased respiratory effort with accessory muscle usage and intercostal retractions.  Coarse breath sounds throughout consistent with fluid in all lung fields.  Tachypnea.  Speaking in short sentences. Abd:  No distention.  No tenderness to palpation of the abdomen.   ED Results / Procedures / Treatments   Labs (all labs ordered are listed, but only abnormal results are displayed) Labs Reviewed  LACTIC ACID, PLASMA - Abnormal; Notable for the following components:      Result Value   Lactic Acid, Venous 3.7 (*)  All other components within normal limits  LACTIC ACID, PLASMA - Abnormal; Notable for the following components:   Lactic Acid, Venous 2.8 (*)    All other components within normal limits  COMPREHENSIVE METABOLIC PANEL - Abnormal; Notable for the following components:   Sodium 134 (*)    Chloride 87 (*)    CO2 35 (*)    Glucose, Bld 351 (*)    BUN 31 (*)    Calcium 8.7 (*)    Total Protein 8.7 (*)    All other components within normal limits  PROTIME-INR - Abnormal; Notable for the following components:   Prothrombin Time 15.6 (*)    All other components within normal limits  BLOOD GAS, VENOUS - Abnormal; Notable for the following components:   pH, Ven 7.47 (*)    Bicarbonate 41.5 (*)    Acid-Base Excess 15.1 (*)    All other components within normal limits  BRAIN NATRIURETIC PEPTIDE - Abnormal; Notable for the following components:   B Natriuretic Peptide 145.3 (*)    All other components within normal limits  CBC WITH DIFFERENTIAL/PLATELET  - Abnormal; Notable for the following components:   WBC 13.9 (*)    Hemoglobin 11.6 (*)    HCT 37.6 (*)    RDW 18.8 (*)    Neutro Abs 11.5 (*)    Monocytes Absolute 1.5 (*)    All other components within normal limits  CULTURE, BLOOD (SINGLE)  RESP PANEL BY RT-PCR (RSV, FLU A&B, COVID)  RVPGX2  LIPASE, BLOOD  CBC WITH DIFFERENTIAL/PLATELET  HEMOGLOBIN A1C  HIV ANTIBODY (ROUTINE TESTING W REFLEX)  TROPONIN I (HIGH SENSITIVITY)     EKG  ED ECG REPORT I, Loleta Rose, the attending physician, personally viewed and interpreted this ECG.  Date: 11/18/2023 EKG Time: 2:45 AM Rate: 139 Rhythm: Sinus tachycardia QRS Axis: normal Intervals: normal ST/T Wave abnormalities: Non-specific ST segment / T-wave changes, but no clear evidence of acute ischemia. Narrative Interpretation: no definitive evidence of acute ischemia; does not meet STEMI criteria.    RADIOLOGY I viewed and interpreted the patient's chest x-ray.  See hospital course for details.  Also see hospital course for details about the CTA chest and CT abdomen/pelvis.    PROCEDURES:  Critical Care performed: Yes, see critical care procedure note(s)  .1-3 Lead EKG Interpretation  Performed by: Loleta Rose, MD Authorized by: Loleta Rose, MD     Interpretation: abnormal     ECG rate:  135   ECG rate assessment: tachycardic     Rhythm: sinus tachycardia     Ectopy: none     Conduction: normal   .Critical Care  Performed by: Loleta Rose, MD Authorized by: Loleta Rose, MD   Critical care provider statement:    Critical care time (minutes):  60   Critical care was necessary to treat or prevent imminent or life-threatening deterioration of the following conditions:  Respiratory failure     IMPRESSION / MDM / ASSESSMENT AND PLAN / ED COURSE  I reviewed the triage vital signs and the nursing notes.                              Differential diagnosis includes, but is not limited to, CHF (new onset or  exacerbation), PE, pneumonia, ACS.  Patient's presentation is most consistent with acute presentation with potential threat to life or bodily function.  Labs/studies ordered: CBC with differential, BMP, blood culture, lipase, CBC with  differential, lactic acid, respiratory viral panel, high-sensitivity troponin, VBG, pro time-INR, comprehensive metabolic panel, 1 view chest x-ray, EKG  Interventions/Medications given:  Medications  lactated ringers infusion (150 mL/hr Intravenous New Bag/Given 11/18/23 0619)  insulin glargine-yfgn (SEMGLEE) injection 15 Units (has no administration in time range)  insulin aspart (novoLOG) injection 0-15 Units (has no administration in time range)  enoxaparin (LOVENOX) injection 40 mg (has no administration in time range)  ceFEPIme (MAXIPIME) 2 g in sodium chloride 0.9 % 100 mL IVPB (0 g Intravenous Stopped 11/18/23 0718)  acetaminophen (TYLENOL) tablet 650 mg (has no administration in time range)    Or  acetaminophen (TYLENOL) suppository 650 mg (has no administration in time range)  ondansetron (ZOFRAN) tablet 4 mg ( Per Tube See Alternative 11/18/23 0626)    Or  ondansetron (ZOFRAN) injection 4 mg (4 mg Intravenous Given 11/18/23 0626)  albuterol (PROVENTIL) (2.5 MG/3ML) 0.083% nebulizer solution 2.5 mg (has no administration in time range)  vancomycin (VANCOCIN) IVPB 1000 mg/200 mL premix (1,000 mg Intravenous New Bag/Given 11/18/23 0718)    Followed by  vancomycin (VANCOREADY) IVPB 500 mg/100 mL (has no administration in time range)  vancomycin (VANCOREADY) IVPB 750 mg/150 mL (has no administration in time range)  sodium chloride 0.9 % bolus 1,000 mL (0 mLs Intravenous Paused 11/18/23 0749)  iohexol (OMNIPAQUE) 350 MG/ML injection 100 mL (100 mLs Intravenous Contrast Given 11/18/23 0447)  enoxaparin (LOVENOX) injection 60 mg (60 mg Subcutaneous Given 11/18/23 0529)  morphine (PF) 4 MG/ML injection 4 mg (4 mg Intravenous Given 11/18/23 0627)  ondansetron  (ZOFRAN) injection 4 mg (4 mg Intravenous Given 11/18/23 0743)  oxyCODONE (Oxy IR/ROXICODONE) immediate release tablet 10 mg (10 mg Oral Given 11/18/23 0732)    (Note:  hospital course my include additional interventions and/or labs/studies not listed above.)   Severe respiratory distress, improving with positive pressure ventilation by EMS.  Continue BiPAP.  Suspect volume overload/CHF.  Initiating broad workup.  No ischemia on EKG, doubt ACS is the primary issue.  No need for intubation at this time.  The patient is on the cardiac monitor to evaluate for evidence of arrhythmia and/or significant heart rate changes.   Clinical Course as of 11/18/23 0751  Mon Nov 18, 2023  2130 Lactic Acid, Venous(!!): 3.7 The patient is noted to have a lactate>4. With the current information available to me, I don't think the patient is in septic shock. The lactate>3, is related to respiratory distress/ respiratory failure  [CF]  [phone] Consulting hospitalist service for admission.  The patient's labs are generally reassuring.  I see no documented history of CHF and he has no obvious pulmonary edema on chest x-ray, but he has a severely elevated left hemidiaphragm.  I am concerned about the possibility of pulmonary embolism.  I consulted respiratory therapy and asked them to try to wean him off of the BiPAP because he is feeling much better now and said he would like to come off the mask if possible.  I ordered a CTA chest PE protocol as well as a CT abdomen/pelvis given his persistent abdominal pain and history of esophageal cancer.  I discussed the case in person with Dr. Para March with the hospitalist service who will admit. [CF]  [phone] Given the patient's high probability of PE, I ordered Lovenox 1 mg/kg subcutaneous [CF]  0616 I viewed and interpreted the patient's CTA chest and CT abdomen/pelvis.  Grossly abnormal appearing bowel consistent with bowel obstruction.  No obvious PE or pulmonary  edema/infection.  Radiology report confirmed probable early or partial bowel obstruction and very large left elevated hemidiaphragm.  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  I do not have a great reason for the acute respiratory failure with hypoxia.  He is now doing well on nasal cannula but continues to report abdominal pain.  I again consulted with Dr. Para March and we decided that it may be in the patient's best interest if I contacted [facility] about transfer.  I talked with him about this and he said that he would agree with transfer to [facility] if they are able to accept him.  I am going to treat him empirically with antibiotics given the presence of the bronchoesophageal fistula and the recent instrumentation.  Given the new information available and lack of evidence of pulmonary edema, I will also give a liter of fluids.  Morphine 4 mg IV and Zofran 4 mg IV for pain and nausea.  Awaiting to hear back from [facility] about possible transfer.  Dr. Para March will consult but hold off on admitting the patient. [CF]  [id number] Consulted with the [facility] transfer center.  I explained the case in detail and she will reach out to the appropriate parties and call us back. [CF]  [phone] Of note, I initially ordered Zosyn 3.375 g IV for empiric antibiotic coverage, but then I learned that Dr. Para March had already ordered cefepime 2 g IV.  I think that is very reasonable and appropriate intra-abdominal coverage so we will hold off on additional antibiotics.  Given no evidence of CHF nor pulmonary edema, I also ordered normal saline 1 L IV bolus. [CF]  [phone] Transferring ED care to Dr. Modesto Charon to continue to manage the patient while in the [facility] ED and to facilitate transfer.  [CF]    Clinical Course User Index [CF] Loleta Rose, MD     FINAL CLINICAL IMPRESSION(S) / ED DIAGNOSES   Final diagnoses:  Acute respiratory failure with hypoxia (HCC)  Elevated hemidiaphragm  History of  esophageal cancer  Intestinal obstruction, unspecified cause, unspecified whether partial or complete (HCC)     Rx / DC Orders   ED Discharge Orders     None        Note:  This document was prepared using Dragon voice recognition software and may include unintentional dictation errors.   Loleta Rose, MD 11/18/23 [phone]

## 2023-11-18 NOTE — Assessment & Plan Note (Addendum)
Hold tube feeds due to suspicion for aspiration Ostomy care

## 2023-11-18 NOTE — ED Notes (Signed)
[facility] called spoke with rep. Kim she stated a truck was on the way and she needed to consult with the nurse about intubation and other drips pt. may have going. rep was transferred for consult.

## 2023-11-18 NOTE — Assessment & Plan Note (Addendum)
Sliding scale insulin coverage Basal insulin 

## 2023-11-19 LAB — HIV ANTIBODY (ROUTINE TESTING W REFLEX): HIV Screen 4th Generation wRfx: NONREACTIVE

## 2023-11-20 ENCOUNTER — Encounter: Payer: Self-pay | Admitting: Oncology

## 2023-11-23 LAB — CULTURE, BLOOD (SINGLE): Culture: NO GROWTH
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
## 2023-12-18 IMAGING — CR DG ABDOMEN 1V
1 series · 2 of 2 positions shown · non-contrast
Comparison: None.

CLINICAL DATA: Generalized abdominal pain.  Constipation.

EXAM:
ABDOMEN - 1 VIEW

[Series 1: dg abd 1 view · 0.14mm/px · 2 of 2 slices shown]
[im 1/2]
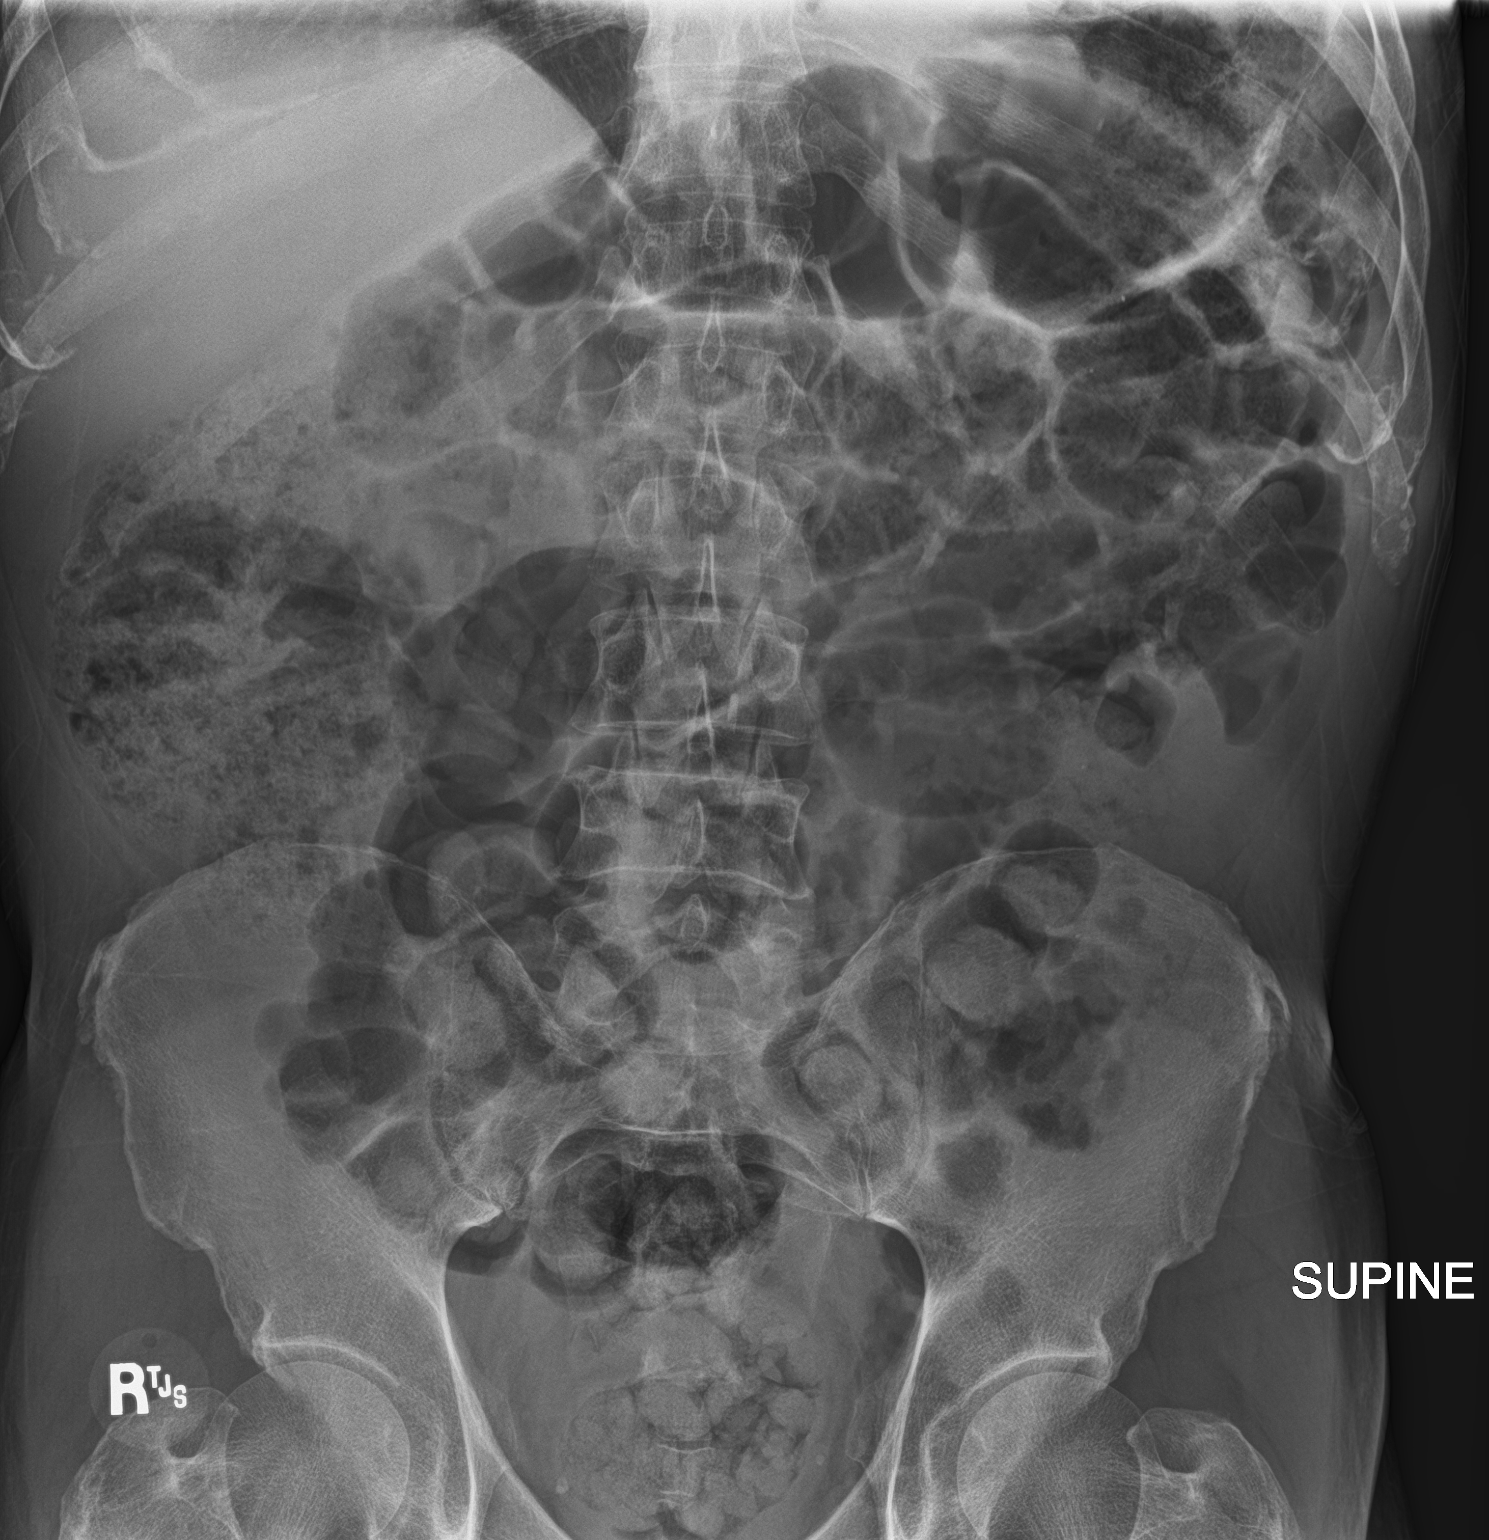
[im 2/2]
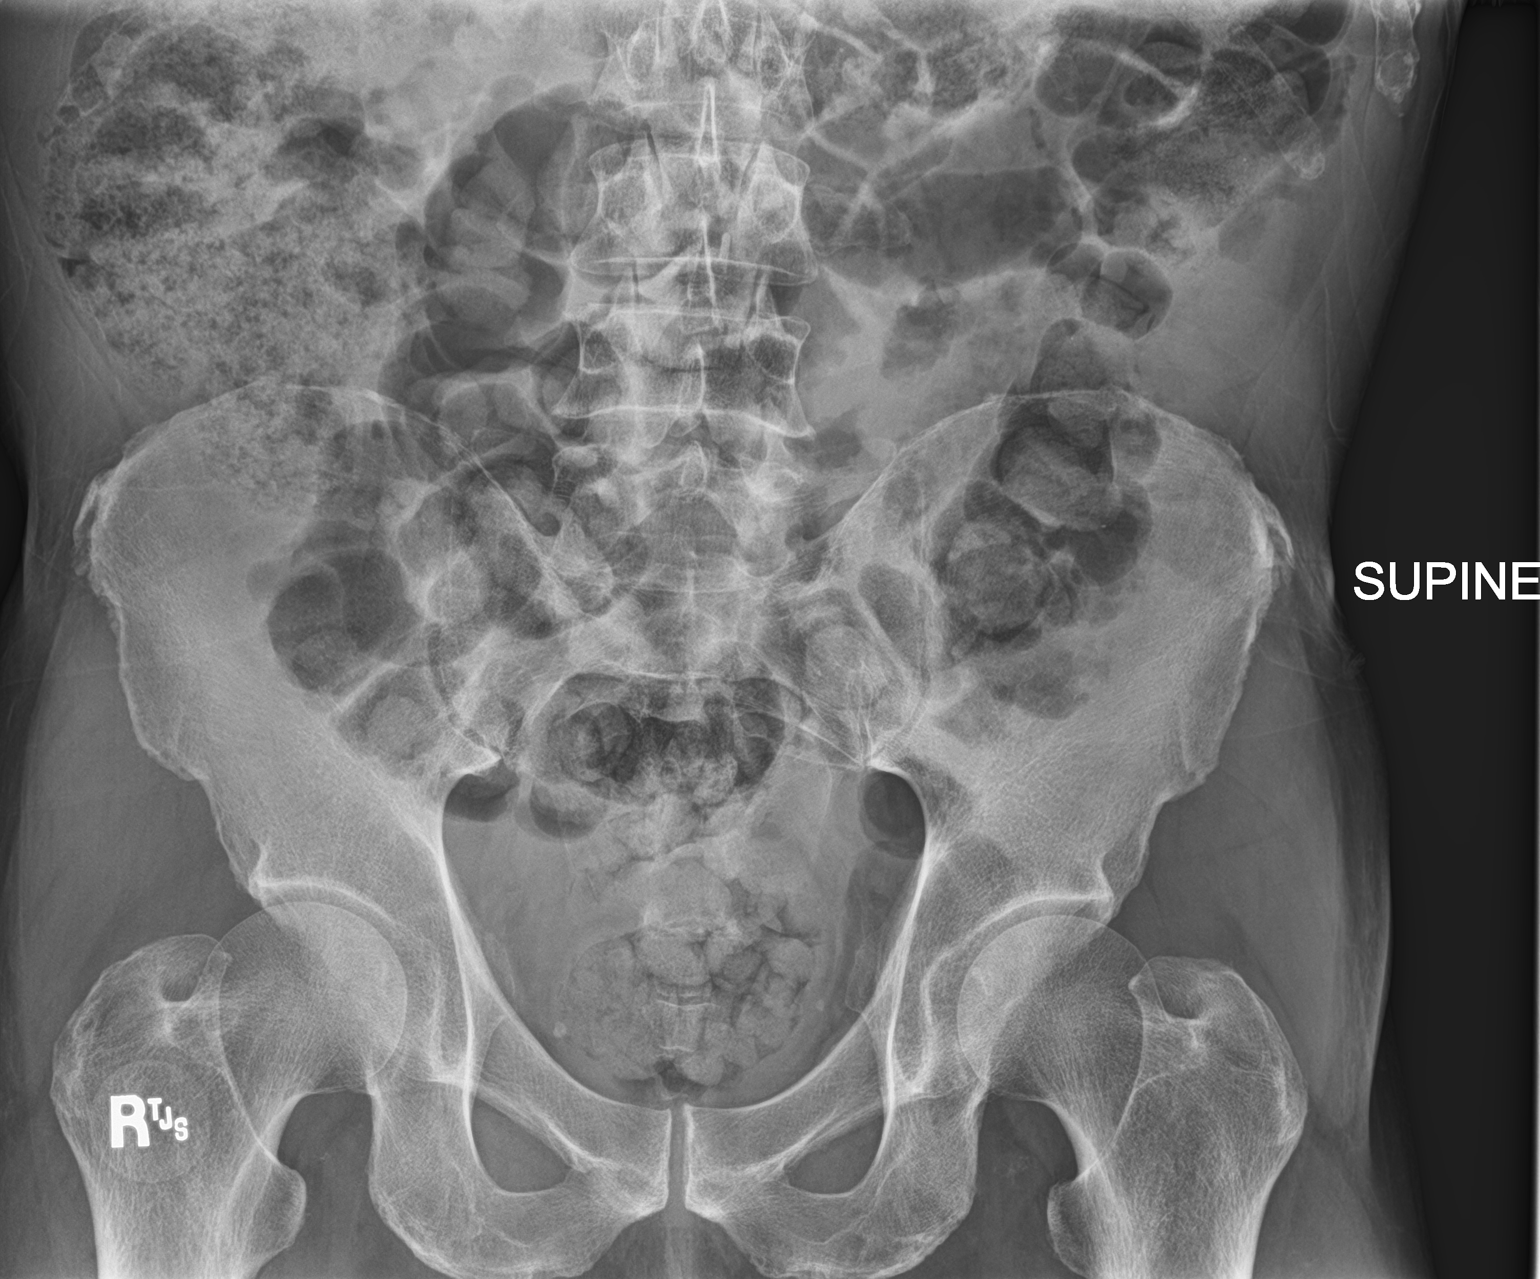

[2 of 2 positions shown; findings below may reference images not displayed]

FINDINGS: Moderate fecal loading is identified throughout the colon,
particularly in the region of the cecum and rectum. No bowel
obstruction. No other abnormalities.
IMPRESSION: Moderate fecal loading as above.

## 2023-12-27 ENCOUNTER — Encounter: Payer: Self-pay | Admitting: Oncology

## 2024-04-24 ENCOUNTER — Encounter: Payer: Self-pay | Admitting: Oncology
